# Patient Record
Sex: Female | Born: 1980 | Race: Black or African American | Hispanic: No | Marital: Single | Smoking: Current every day smoker
Health system: Southern US, Community
[De-identification: ages and names within clinical notes are randomized; demographics above are authoritative.]

## PROBLEM LIST (undated history)

## (undated) DIAGNOSIS — I10 Essential (primary) hypertension: Secondary | ICD-10-CM

## (undated) DIAGNOSIS — B009 Herpesviral infection, unspecified: Secondary | ICD-10-CM

## (undated) DIAGNOSIS — G43909 Migraine, unspecified, not intractable, without status migrainosus: Secondary | ICD-10-CM

## (undated) DIAGNOSIS — N926 Irregular menstruation, unspecified: Secondary | ICD-10-CM

## (undated) DIAGNOSIS — N83209 Unspecified ovarian cyst, unspecified side: Secondary | ICD-10-CM

## (undated) HISTORY — DX: Herpesviral infection, unspecified: B00.9

## (undated) HISTORY — PX: KNEE SURGERY: SHX244

---

## 2020-04-12 ENCOUNTER — Encounter (HOSPITAL_COMMUNITY): Payer: Self-pay | Admitting: *Deleted

## 2020-04-12 ENCOUNTER — Emergency Department (HOSPITAL_COMMUNITY)
Admission: EM | Admit: 2020-04-12 | Discharge: 2020-04-12 | Disposition: A | Discharge status: Home / Self Care | Payer: Self-pay | Attending: Emergency Medicine | Admitting: Emergency Medicine

## 2020-04-12 ENCOUNTER — Emergency Department (HOSPITAL_COMMUNITY): Payer: Self-pay

## 2020-04-12 ENCOUNTER — Other Ambulatory Visit: Payer: Self-pay

## 2020-04-12 DIAGNOSIS — F17200 Nicotine dependence, unspecified, uncomplicated: Secondary | ICD-10-CM | POA: Insufficient documentation

## 2020-04-12 DIAGNOSIS — D259 Leiomyoma of uterus, unspecified: Secondary | ICD-10-CM | POA: Insufficient documentation

## 2020-04-12 DIAGNOSIS — R112 Nausea with vomiting, unspecified: Secondary | ICD-10-CM | POA: Insufficient documentation

## 2020-04-12 DIAGNOSIS — K8020 Calculus of gallbladder without cholecystitis without obstruction: Secondary | ICD-10-CM | POA: Insufficient documentation

## 2020-04-12 LAB — LIPASE, BLOOD: Lipase: 30 U/L (ref 11–51)

## 2020-04-12 LAB — URINALYSIS, ROUTINE W REFLEX MICROSCOPIC
Bacteria, UA: NONE SEEN
Bilirubin Urine: NEGATIVE
Glucose, UA: NEGATIVE mg/dL
Hgb urine dipstick: NEGATIVE
Ketones, ur: NEGATIVE mg/dL
Leukocytes,Ua: NEGATIVE
Nitrite: NEGATIVE
Protein, ur: NEGATIVE mg/dL
Specific Gravity, Urine: 1.02 (ref 1.005–1.030)
pH: 6 (ref 5.0–8.0)

## 2020-04-12 LAB — COMPREHENSIVE METABOLIC PANEL
ALT: 13 U/L (ref 0–44)
AST: 15 U/L (ref 15–41)
Albumin: 4.6 g/dL (ref 3.5–5.0)
Alkaline Phosphatase: 59 U/L (ref 38–126)
Anion gap: 8 (ref 5–15)
BUN: 19 mg/dL (ref 6–20)
CO2: 27 mmol/L (ref 22–32)
Calcium: 9.3 mg/dL (ref 8.9–10.3)
Chloride: 102 mmol/L (ref 98–111)
Creatinine, Ser: 1.02 mg/dL — ABNORMAL HIGH (ref 0.44–1.00)
GFR calc Af Amer: >60 mL/min (ref >60)
GFR calc non Af Amer: >60 mL/min (ref >60)
Glucose, Bld: 109 mg/dL — ABNORMAL HIGH (ref 70–99)
Potassium: 4.1 mmol/L (ref 3.5–5.1)
Sodium: 137 mmol/L (ref 135–145)
Total Bilirubin: 0.4 mg/dL (ref 0.3–1.2)
Total Protein: 8.4 g/dL — ABNORMAL HIGH (ref 6.5–8.1)

## 2020-04-12 LAB — CBC
HCT: 43.5 % (ref 36.0–46.0)
Hemoglobin: 14.3 g/dL (ref 12.0–15.0)
MCH: 29.7 pg (ref 26.0–34.0)
MCHC: 32.9 g/dL (ref 30.0–36.0)
MCV: 90.2 fL (ref 80.0–100.0)
Platelets: 332 10*3/uL (ref 150–400)
RBC: 4.82 MIL/uL (ref 3.87–5.11)
RDW: 13.9 % (ref 11.5–15.5)
WBC: 10.3 10*3/uL (ref 4.0–10.5)
nRBC: 0 % (ref 0.0–0.2)

## 2020-04-12 LAB — I-STAT BETA HCG BLOOD, ED (MC, WL, AP ONLY): I-stat hCG, quantitative: <5 m[IU]/mL (ref <5)

## 2020-04-12 MED ORDER — ONDANSETRON 4 MG PO TBDP: 4.0000 mg | ORAL_TABLET | Freq: Three times a day (TID) | ORAL | 0 refills | Status: DC | PRN

## 2020-04-12 MED ORDER — MORPHINE SULFATE (PF) 4 MG/ML IV SOLN
4.0000 mg | Freq: Once | INTRAVENOUS | Status: AC
  Administered 2020-04-12: 4 mg via INTRAVENOUS
  Filled 2020-04-12: qty 1

## 2020-04-12 MED ORDER — SODIUM CHLORIDE 0.9 % IV BOLUS
1000.0000 mL | Freq: Once | INTRAVENOUS | Status: AC
  Administered 2020-04-12: 1000 mL via INTRAVENOUS

## 2020-04-12 MED ORDER — SODIUM CHLORIDE (PF) 0.9 % IJ SOLN
INTRAMUSCULAR | Status: AC
  Filled 2020-04-12: qty 50

## 2020-04-12 MED ORDER — ONDANSETRON HCL 4 MG/2ML IJ SOLN
4.0000 mg | Freq: Once | INTRAMUSCULAR | Status: AC
  Administered 2020-04-12: 4 mg via INTRAVENOUS
  Filled 2020-04-12: qty 2

## 2020-04-12 MED ORDER — IOHEXOL 300 MG/ML  SOLN
100.0000 mL | Freq: Once | INTRAMUSCULAR | Status: AC | PRN
  Administered 2020-04-12: 100 mL via INTRAVENOUS

## 2020-04-12 NOTE — ED Notes (Signed)
Pt ambulated to restroom without assistance.

## 2020-04-12 NOTE — ED Provider Notes (Signed)
[place] DEPT Provider Note   CSN: [id number] Arrival date & time: 04/12/20  1001     History Chief Complaint  Patient presents with  . Abdominal Pain  . Emesis    Gabriela Best is a 39 y.o. female who presents to ED with a chief complaint of abdominal pain.  Reports some right middle and upper abdominal pain for the past week.  Pain has since progressed to the epigastric and left-sided area with associated nonbloody, nonbilious emesis since yesterday.  No trigger that patient is aware of.  Denies any changes to bowel movements or urination.  Denies possibility of pregnancy.  Has not been taking medications to help with symptoms.  No prior abdominal surgeries.  Reports occasional alcohol use but denies daily use, drug use or chronic NSAID use.  No sick contacts with similar symptoms.  Denies any cough, chest pain, shortness of breath, congestion, back pain or fevers.  HPI     History reviewed. No pertinent past medical history.  There are no problems to display for this patient.   Past Surgical History:  Procedure Laterality Date  . KNEE SURGERY       OB History   No obstetric history on file.     No family history on file.  Social History   Tobacco Use  . Smoking status: Current Every Day Smoker  . Smokeless tobacco: Never Used  Substance Use Topics  . Alcohol use: Yes    Comment: occ  . Drug use: Never    Home Medications Prior to Admission medications   Medication Sig Start Date End Date Taking? Authorizing Provider  hydrocortisone cream 1 % Apply 1 application topically 4 (four) times daily as needed for itching (eczema).   Yes [provider]  ondansetron (ZOFRAN ODT) 4 MG disintegrating tablet Take 1 tablet (4 mg total) by mouth every 8 (eight) hours as needed for nausea or vomiting. 04/12/20   Delia Heady, PA-C    Allergies    Patient has no known allergies.  Review of Systems   Review of Systems    Constitutional: Negative for appetite change, chills and fever.  HENT: Negative for ear pain, rhinorrhea, sneezing and sore throat.   Eyes: Negative for photophobia and visual disturbance.  Respiratory: Negative for cough, chest tightness, shortness of breath and wheezing.   Cardiovascular: Negative for chest pain and palpitations.  Gastrointestinal: Positive for abdominal pain, nausea and vomiting. Negative for blood in stool, constipation and diarrhea.  Genitourinary: Negative for dysuria, hematuria and urgency.  Musculoskeletal: Negative for myalgias.  Skin: Negative for rash.  Neurological: Negative for dizziness, weakness and light-headedness.    Physical Exam Updated Vital Signs BP (!) 155/76   Pulse 68   Temp 99 F (37.2 C) (Oral)   Resp 15   Ht 5\' 3"  (1.6 m)   Wt 119.3 kg   SpO2 99%   BMI 46.59 kg/m   Physical Exam Vitals and nursing note reviewed.  Constitutional:      General: She is not in acute distress.    Appearance: She is well-developed.  HENT:     Head: Normocephalic and atraumatic.     Nose: Nose normal.  Eyes:     General: No scleral icterus.       Left eye: No discharge.     Conjunctiva/sclera: Conjunctivae normal.  Cardiovascular:     Rate and Rhythm: Normal rate and regular rhythm.     Heart sounds: Normal heart sounds. No murmur  heard.  No friction rub. No gallop.   Pulmonary:     Effort: Pulmonary effort is normal. No respiratory distress.     Breath sounds: Normal breath sounds.  Abdominal:     General: Bowel sounds are normal. There is no distension.     Palpations: Abdomen is soft.     Tenderness: There is abdominal tenderness in the epigastric area and left upper quadrant. There is no guarding.  Musculoskeletal:        General: Normal range of motion.     Cervical back: Normal range of motion and neck supple.  Skin:    General: Skin is warm and dry.     Findings: No rash.  Neurological:     Mental Status: She is alert.     Motor:  No abnormal muscle tone.     Coordination: Coordination normal.     ED Results / Procedures / Treatments   Labs (all labs ordered are listed, but only abnormal results are displayed) Labs Reviewed  COMPREHENSIVE METABOLIC PANEL - Abnormal; Notable for the following components:      Result Value   Glucose, Bld 109 (*)    Creatinine, Ser 1.02 (*)    Total Protein 8.4 (*)    All other components within normal limits  URINALYSIS, ROUTINE W REFLEX MICROSCOPIC - Abnormal; Notable for the following components:   Color, Urine STRAW (*)    All other components within normal limits  LIPASE, BLOOD  CBC  I-STAT BETA HCG BLOOD, ED ([facility], [facility], [facility] ONLY)    EKG None  Radiology CT ABDOMEN PELVIS W CONTRAST  Result Date: 04/12/2020 CLINICAL DATA:  Diverticulitis suspected. Abdominal pain for 1 week. EXAM: CT ABDOMEN AND PELVIS WITH CONTRAST TECHNIQUE: Multidetector CT imaging of the abdomen and pelvis was performed using the standard protocol following bolus administration of intravenous contrast. CONTRAST:  156mL OMNIPAQUE IOHEXOL 300 MG/ML  SOLN COMPARISON:  None. FINDINGS: Lower chest:  No contributory findings. Hepatobiliary: No focal liver abnormality.At least 1 gallstone. No evidence of cholecystitis. Pancreas: Unremarkable. Spleen: Unremarkable. Adrenals/Urinary Tract: Negative adrenals. No hydronephrosis or stone. Unremarkable bladder. Stomach/Bowel:  No obstruction. No appendicitis. Vascular/Lymphatic: No acute vascular abnormality. No mass or adenopathy. Reproductive:IUD which is posteriorly eccentric to the uterus, likely from an anterior fibroid. A subserosal fibroid is seen at the fundus and measures 3.3 cm. Other: No ascites or pneumoperitoneum. Musculoskeletal: No acute abnormalities. IMPRESSION: 1. No acute finding. 2. Fibroid uterus. 3. Cholelithiasis. Electronically Signed   By: Monte Fantasia M.D.   On: 04/12/2020 13:54    Procedures Procedures (including critical care  time)  Medications Ordered in ED Medications  sodium chloride 0.9 % bolus 1,000 mL (0 mLs Intravenous Stopped 04/12/20 1252)  morphine 4 MG/ML injection 4 mg (4 mg Intravenous Given 04/12/20 1119)  ondansetron (ZOFRAN) injection 4 mg (4 mg Intravenous Given 04/12/20 1117)  sodium chloride (PF) 0.9 % injection (  Given by Other 04/12/20 1340)  iohexol (OMNIPAQUE) 300 MG/ML solution 100 mL (100 mLs Intravenous Contrast Given 04/12/20 1306)    ED Course  I have reviewed the triage vital signs and the nursing notes.  Pertinent labs & imaging results that were available during my care of the patient were reviewed by me and considered in my medical decision making (see chart for details).    MDM Rules/Calculators/A&P                          39 year old female presenting  to the ED with a chief complaint of abdominal pain.  Right-sided abdominal pain that is now epigastric and left-sided.  Began 1 week ago and progressed yesterday.  Reports emesis since yesterday but no changes to bowel movements or urination.  Abdomen tender in epigastric area.  No pelvic complaints or lower abdominal tenderness.  Afebrile without recent use of antipyretics.  Lab work including CBC, CMP, lipase unremarkable.  hCG is negative.  Urinalysis is unremarkable.  Due to patient's tenderness and duration of pain will need to obtain CT scan to rule out acute emergent or surgical cause of symptoms.  Will attempt to control symptoms here with IV medications.  Patient with significant improvement in her symptoms with medications given.  CT of the abdomen pelvis without any acute findings.  There are some gallstones without evidence of cholecystitis.  There is a uterine fibroid present but IUD is still present in the uterus.  Suspect that her symptoms are viral. I will have her follow-up with surgery regarding her gallstones as this prior pain earlier in the week could be biliary colic.  Patient is agreeable to the plan.  We will give  symptomatic treatment at home and have her can continue hydration.  All imaging, if done today, including plain films, CT scans, and ultrasounds, independently reviewed by me, and interpretations confirmed via formal radiology reads.  Patient is hemodynamically stable, in NAD, and able to ambulate in the ED. Evaluation does not show pathology that would require ongoing emergent intervention or inpatient treatment. I explained the diagnosis to the patient. Pain has been managed and has no complaints prior to discharge. Patient is comfortable with above plan and is stable for discharge at this time. All questions were answered prior to disposition. Strict return precautions for returning to the ED were discussed. Encouraged follow up with PCP.   An After Visit Summary was printed and given to the patient.   Portions of this note were generated with [employer]. Dictation errors may occur despite best attempts at proofreading.  Final Clinical Impression(s) / ED Diagnoses Final diagnoses:  Calculus of gallbladder without cholecystitis without obstruction  Non-intractable vomiting with nausea, unspecified vomiting type  Uterine leiomyoma, unspecified location    Rx / DC Orders ED Discharge Orders         Ordered    ondansetron (ZOFRAN ODT) 4 MG disintegrating tablet  Every 8 hours PRN        04/12/20 1400           Delia Heady, PA-C 04/12/20 1406    Veryl Speak, MD 04/12/20 1424

## 2020-04-12 NOTE — ED Notes (Signed)
Pt educated on discharge instructions and follow up care. PT verbalized understanding and denies questions at this time. Pt family member present to drive pt home.

## 2020-04-12 NOTE — Discharge Instructions (Addendum)
Take the medications as needed to help with your symptoms. Make sure you are drinking plenty of water and slowly advancing your diet as tolerated. Your CT scan showed that you had a small gallstone.  You will need to follow-up with your primary care provider about this.  I have given follow-up with surgery as well. There is no evidence that your gallbladder is inflamed or infected. Return to the ER if you start to experience worsening pain, fever, increased vomiting, chest pain or shortness of breath.

## 2020-04-12 NOTE — ED Triage Notes (Signed)
Reports abd pain x 1 week with vomiting that has increased. This morning "can't take it anymore"

## 2020-09-23 ENCOUNTER — Emergency Department (HOSPITAL_COMMUNITY): Payer: Self-pay

## 2020-09-23 ENCOUNTER — Encounter (HOSPITAL_COMMUNITY): Payer: Self-pay

## 2020-09-23 ENCOUNTER — Emergency Department (HOSPITAL_COMMUNITY)
Admission: EM | Admit: 2020-09-23 | Discharge: 2020-09-23 | Disposition: A | Discharge status: Home / Self Care | Payer: Self-pay | Attending: Emergency Medicine | Admitting: Emergency Medicine

## 2020-09-23 ENCOUNTER — Other Ambulatory Visit: Payer: Self-pay

## 2020-09-23 DIAGNOSIS — R55 Syncope and collapse: Secondary | ICD-10-CM | POA: Insufficient documentation

## 2020-09-23 DIAGNOSIS — R519 Headache, unspecified: Secondary | ICD-10-CM | POA: Insufficient documentation

## 2020-09-23 DIAGNOSIS — F17210 Nicotine dependence, cigarettes, uncomplicated: Secondary | ICD-10-CM | POA: Insufficient documentation

## 2020-09-23 HISTORY — DX: Migraine, unspecified, not intractable, without status migrainosus: G43.909

## 2020-09-23 LAB — I-STAT BETA HCG BLOOD, ED (MC, WL, AP ONLY): I-stat hCG, quantitative: <5 m[IU]/mL (ref <5)

## 2020-09-23 LAB — BASIC METABOLIC PANEL
Anion gap: 7 (ref 5–15)
BUN: 11 mg/dL (ref 6–20)
CO2: 26 mmol/L (ref 22–32)
Calcium: 9.2 mg/dL (ref 8.9–10.3)
Chloride: 102 mmol/L (ref 98–111)
Creatinine, Ser: 0.79 mg/dL (ref 0.44–1.00)
GFR, Estimated: >60 mL/min (ref >60)
Glucose, Bld: 86 mg/dL (ref 70–99)
Potassium: 4.7 mmol/L (ref 3.5–5.1)
Sodium: 135 mmol/L (ref 135–145)

## 2020-09-23 LAB — CBC
HCT: 42.6 % (ref 36.0–46.0)
Hemoglobin: 13.8 g/dL (ref 12.0–15.0)
MCH: 29.2 pg (ref 26.0–34.0)
MCHC: 32.4 g/dL (ref 30.0–36.0)
MCV: 90.3 fL (ref 80.0–100.0)
Platelets: 326 10*3/uL (ref 150–400)
RBC: 4.72 MIL/uL (ref 3.87–5.11)
RDW: 14 % (ref 11.5–15.5)
WBC: 10 10*3/uL (ref 4.0–10.5)
nRBC: 0 % (ref 0.0–0.2)

## 2020-09-23 LAB — URINALYSIS, ROUTINE W REFLEX MICROSCOPIC
Bilirubin Urine: NEGATIVE
Glucose, UA: NEGATIVE mg/dL
Hgb urine dipstick: NEGATIVE
Ketones, ur: NEGATIVE mg/dL
Leukocytes,Ua: NEGATIVE
Nitrite: NEGATIVE
Protein, ur: NEGATIVE mg/dL
Specific Gravity, Urine: 1.016 (ref 1.005–1.030)
pH: 5 (ref 5.0–8.0)

## 2020-09-23 LAB — CBG MONITORING, ED: Glucose-Capillary: 81 mg/dL (ref 70–99)

## 2020-09-23 MED ORDER — PROCHLORPERAZINE EDISYLATE 10 MG/2ML IJ SOLN
10.0000 mg | Freq: Once | INTRAMUSCULAR | Status: AC
  Administered 2020-09-23: 10 mg via INTRAVENOUS
  Filled 2020-09-23: qty 2

## 2020-09-23 MED ORDER — SODIUM CHLORIDE 0.9 % IV BOLUS
1000.0000 mL | Freq: Once | INTRAVENOUS | Status: AC
  Administered 2020-09-23: 1000 mL via INTRAVENOUS

## 2020-09-23 MED ORDER — DIPHENHYDRAMINE HCL 50 MG/ML IJ SOLN
25.0000 mg | Freq: Once | INTRAMUSCULAR | Status: AC
  Administered 2020-09-23: 25 mg via INTRAVENOUS
  Filled 2020-09-23: qty 1

## 2020-09-23 MED ORDER — KETOROLAC TROMETHAMINE 30 MG/ML IJ SOLN
30.0000 mg | Freq: Once | INTRAMUSCULAR | Status: AC
  Administered 2020-09-23: 30 mg via INTRAVENOUS
  Filled 2020-09-23: qty 1
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
## 2020-09-23 NOTE — Discharge Instructions (Signed)
Please follow-up with [facility] which is a free clinic in the area to help manage your headaches.  Return to the ER for any new or worsening symptoms.
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
## 2020-10-27 ENCOUNTER — Encounter (HOSPITAL_COMMUNITY): Payer: Self-pay

## 2020-10-27 ENCOUNTER — Emergency Department (HOSPITAL_COMMUNITY)
Admission: EM | Admit: 2020-10-27 | Discharge: 2020-10-27 | Disposition: A | Discharge status: Home / Self Care | Payer: Worker's Compensation | Attending: Emergency Medicine | Admitting: Emergency Medicine

## 2020-10-27 ENCOUNTER — Emergency Department (HOSPITAL_COMMUNITY): Payer: Worker's Compensation

## 2020-10-27 ENCOUNTER — Other Ambulatory Visit: Payer: Self-pay

## 2020-10-27 DIAGNOSIS — Y92410 Unspecified street and highway as the place of occurrence of the external cause: Secondary | ICD-10-CM | POA: Diagnosis not present

## 2020-10-27 DIAGNOSIS — S39012A Strain of muscle, fascia and tendon of lower back, initial encounter: Secondary | ICD-10-CM | POA: Diagnosis not present

## 2020-10-27 DIAGNOSIS — F17210 Nicotine dependence, cigarettes, uncomplicated: Secondary | ICD-10-CM | POA: Insufficient documentation

## 2020-10-27 DIAGNOSIS — S3992XA Unspecified injury of lower back, initial encounter: Secondary | ICD-10-CM | POA: Diagnosis present

## 2020-10-27 MED ORDER — NAPROXEN 500 MG PO TABS: 500.0000 mg | ORAL_TABLET | Freq: Two times a day (BID) | ORAL | 0 refills | Status: DC

## 2020-10-27 MED ORDER — OXYCODONE-ACETAMINOPHEN 5-325 MG PO TABS
1.0000 | ORAL_TABLET | Freq: Once | ORAL | Status: AC
  Administered 2020-10-27: 1 via ORAL
  Filled 2020-10-27: qty 1

## 2020-10-27 MED ORDER — CYCLOBENZAPRINE HCL 10 MG PO TABS: 10.0000 mg | ORAL_TABLET | Freq: Two times a day (BID) | ORAL | 0 refills | Status: DC | PRN
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
## 2020-11-02 ENCOUNTER — Encounter (HOSPITAL_BASED_OUTPATIENT_CLINIC_OR_DEPARTMENT_OTHER): Payer: Self-pay | Admitting: Emergency Medicine

## 2020-11-02 ENCOUNTER — Emergency Department (HOSPITAL_BASED_OUTPATIENT_CLINIC_OR_DEPARTMENT_OTHER)
Admission: EM | Admit: 2020-11-02 | Discharge: 2020-11-02 | Disposition: A | Discharge status: Home / Self Care | Payer: Worker's Compensation | Attending: Emergency Medicine | Admitting: Emergency Medicine

## 2020-11-02 ENCOUNTER — Other Ambulatory Visit: Payer: Self-pay

## 2020-11-02 ENCOUNTER — Emergency Department (HOSPITAL_BASED_OUTPATIENT_CLINIC_OR_DEPARTMENT_OTHER): Payer: Worker's Compensation

## 2020-11-02 DIAGNOSIS — F17210 Nicotine dependence, cigarettes, uncomplicated: Secondary | ICD-10-CM | POA: Diagnosis not present

## 2020-11-02 DIAGNOSIS — R109 Unspecified abdominal pain: Secondary | ICD-10-CM

## 2020-11-02 DIAGNOSIS — S199XXA Unspecified injury of neck, initial encounter: Secondary | ICD-10-CM | POA: Diagnosis present

## 2020-11-02 DIAGNOSIS — Y92410 Unspecified street and highway as the place of occurrence of the external cause: Secondary | ICD-10-CM | POA: Diagnosis not present

## 2020-11-02 DIAGNOSIS — S161XXA Strain of muscle, fascia and tendon at neck level, initial encounter: Secondary | ICD-10-CM | POA: Insufficient documentation

## 2020-11-02 DIAGNOSIS — K8020 Calculus of gallbladder without cholecystitis without obstruction: Secondary | ICD-10-CM | POA: Insufficient documentation

## 2020-11-02 DIAGNOSIS — M545 Low back pain, unspecified: Secondary | ICD-10-CM

## 2020-11-02 LAB — URINALYSIS, ROUTINE W REFLEX MICROSCOPIC
Bilirubin Urine: NEGATIVE
Glucose, UA: NEGATIVE mg/dL
Ketones, ur: NEGATIVE mg/dL
Leukocytes,Ua: NEGATIVE
Nitrite: NEGATIVE
Protein, ur: NEGATIVE mg/dL
Specific Gravity, Urine: >1.03 — ABNORMAL HIGH (ref 1.005–1.030)
pH: 5.5 (ref 5.0–8.0)

## 2020-11-02 LAB — URINALYSIS, MICROSCOPIC (REFLEX)

## 2020-11-02 LAB — PREGNANCY, URINE: Preg Test, Ur: NEGATIVE

## 2020-11-02 MED ORDER — LIDOCAINE 5 % EX PTCH: 1.0000 | MEDICATED_PATCH | CUTANEOUS | 0 refills | Status: DC

## 2020-11-02 MED ORDER — OXYCODONE-ACETAMINOPHEN 5-325 MG PO TABS: 1.0000 | ORAL_TABLET | Freq: Four times a day (QID) | ORAL | 0 refills | Status: DC | PRN

## 2020-11-02 MED ORDER — KETOROLAC TROMETHAMINE 60 MG/2ML IM SOLN
60.0000 mg | Freq: Once | INTRAMUSCULAR | Status: AC
  Administered 2020-11-02: 60 mg via INTRAMUSCULAR
  Filled 2020-11-02: qty 2

## 2020-11-02 MED ORDER — OXYCODONE-ACETAMINOPHEN 5-325 MG PO TABS
1.0000 | ORAL_TABLET | Freq: Once | ORAL | Status: AC
Start: 2020-11-02 — End: 2020-11-02
  Administered 2020-11-02: 1 via ORAL
  Filled 2020-11-02: qty 1

## 2020-11-02 NOTE — ED Notes (Signed)
CT awaiting results of pregnancy test prior to imaging, per [facility] radiology protocols
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
## 2020-11-13 ENCOUNTER — Encounter (HOSPITAL_COMMUNITY): Payer: Self-pay

## 2020-11-13 ENCOUNTER — Observation Stay (HOSPITAL_COMMUNITY): Payer: Self-pay | Admitting: Anesthesiology

## 2020-11-13 ENCOUNTER — Inpatient Hospital Stay (HOSPITAL_COMMUNITY)
Admission: EM | Admit: 2020-11-13 | Discharge: 2020-11-14 | DRG: 418 | Disposition: A | Discharge status: Home / Self Care | Payer: Self-pay | Attending: Surgery | Admitting: Surgery

## 2020-11-13 ENCOUNTER — Emergency Department (HOSPITAL_COMMUNITY): Payer: Self-pay

## 2020-11-13 ENCOUNTER — Encounter (HOSPITAL_COMMUNITY): Admission: EM | Disposition: A | Discharge status: Home / Self Care | Payer: Self-pay | Source: Home / Self Care

## 2020-11-13 ENCOUNTER — Observation Stay (HOSPITAL_COMMUNITY): Payer: Self-pay

## 2020-11-13 ENCOUNTER — Other Ambulatory Visit: Payer: Self-pay

## 2020-11-13 DIAGNOSIS — K3532 Acute appendicitis with perforation and localized peritonitis, without abscess: Secondary | ICD-10-CM | POA: Diagnosis present

## 2020-11-13 DIAGNOSIS — K8020 Calculus of gallbladder without cholecystitis without obstruction: Secondary | ICD-10-CM | POA: Diagnosis present

## 2020-11-13 DIAGNOSIS — Z419 Encounter for procedure for purposes other than remedying health state, unspecified: Secondary | ICD-10-CM

## 2020-11-13 DIAGNOSIS — K8012 Calculus of gallbladder with acute and chronic cholecystitis without obstruction: Principal | ICD-10-CM | POA: Diagnosis present

## 2020-11-13 DIAGNOSIS — E875 Hyperkalemia: Secondary | ICD-10-CM | POA: Diagnosis present

## 2020-11-13 DIAGNOSIS — Z6841 Body Mass Index (BMI) 40.0 and over, adult: Secondary | ICD-10-CM

## 2020-11-13 DIAGNOSIS — K810 Acute cholecystitis: Secondary | ICD-10-CM

## 2020-11-13 DIAGNOSIS — Z20822 Contact with and (suspected) exposure to covid-19: Secondary | ICD-10-CM | POA: Diagnosis present

## 2020-11-13 DIAGNOSIS — F17210 Nicotine dependence, cigarettes, uncomplicated: Secondary | ICD-10-CM | POA: Diagnosis present

## 2020-11-13 HISTORY — PX: CHOLECYSTECTOMY: SHX55

## 2020-11-13 HISTORY — DX: Calculus of gallbladder without cholecystitis without obstruction: K80.20

## 2020-11-13 LAB — COMPREHENSIVE METABOLIC PANEL
ALT: 15 U/L (ref 0–44)
AST: 23 U/L (ref 15–41)
Albumin: 3.8 g/dL (ref 3.5–5.0)
Alkaline Phosphatase: 53 U/L (ref 38–126)
Anion gap: 7 (ref 5–15)
BUN: 12 mg/dL (ref 6–20)
CO2: 26 mmol/L (ref 22–32)
Calcium: 9.1 mg/dL (ref 8.9–10.3)
Chloride: 104 mmol/L (ref 98–111)
Creatinine, Ser: 0.86 mg/dL (ref 0.44–1.00)
GFR, Estimated: >60 mL/min (ref >60)
Glucose, Bld: 106 mg/dL — ABNORMAL HIGH (ref 70–99)
Potassium: 5.9 mmol/L — ABNORMAL HIGH (ref 3.5–5.1)
Sodium: 137 mmol/L (ref 135–145)
Total Bilirubin: 0.7 mg/dL (ref 0.3–1.2)
Total Protein: 6.8 g/dL (ref 6.5–8.1)

## 2020-11-13 LAB — CBC WITH DIFFERENTIAL/PLATELET
Abs Immature Granulocytes: 0.03 10*3/uL (ref 0.00–0.07)
Basophils Absolute: 0 10*3/uL (ref 0.0–0.1)
Basophils Relative: 0 %
Eosinophils Absolute: 0.3 10*3/uL (ref 0.0–0.5)
Eosinophils Relative: 3 %
HCT: 43.2 % (ref 36.0–46.0)
Hemoglobin: 13.8 g/dL (ref 12.0–15.0)
Immature Granulocytes: 0 %
Lymphocytes Relative: 17 %
Lymphs Abs: 1.7 10*3/uL (ref 0.7–4.0)
MCH: 29.6 pg (ref 26.0–34.0)
MCHC: 31.9 g/dL (ref 30.0–36.0)
MCV: 92.5 fL (ref 80.0–100.0)
Monocytes Absolute: 0.5 10*3/uL (ref 0.1–1.0)
Monocytes Relative: 5 %
Neutro Abs: 7.5 10*3/uL (ref 1.7–7.7)
Neutrophils Relative %: 75 %
Platelets: 293 10*3/uL (ref 150–400)
RBC: 4.67 MIL/uL (ref 3.87–5.11)
RDW: 14 % (ref 11.5–15.5)
WBC: 10 10*3/uL (ref 4.0–10.5)
nRBC: 0 % (ref 0.0–0.2)

## 2020-11-13 LAB — SURGICAL PCR SCREEN
MRSA, PCR: NEGATIVE
Staphylococcus aureus: POSITIVE — AB

## 2020-11-13 LAB — LIPASE, BLOOD: Lipase: 30 U/L (ref 11–51)

## 2020-11-13 LAB — RESP PANEL BY RT-PCR (FLU A&B, COVID) ARPGX2
Influenza A by PCR: NEGATIVE
Influenza B by PCR: NEGATIVE
SARS Coronavirus 2 by RT PCR: NEGATIVE

## 2020-11-13 LAB — I-STAT BETA HCG BLOOD, ED (MC, WL, AP ONLY): I-stat hCG, quantitative: <5 m[IU]/mL (ref <5)

## 2020-11-13 LAB — POTASSIUM: Potassium: 4.1 mmol/L (ref 3.5–5.1)

## 2020-11-13 SURGERY — LAPAROSCOPIC CHOLECYSTECTOMY WITH INTRAOPERATIVE CHOLANGIOGRAM
Anesthesia: General

## 2020-11-13 MED ORDER — LIDOCAINE 2% (20 MG/ML) 5 ML SYRINGE
INTRAMUSCULAR | Status: AC
  Filled 2020-11-13: qty 5

## 2020-11-13 MED ORDER — OXYCODONE HCL 5 MG PO TABS
ORAL_TABLET | ORAL | Status: AC
  Filled 2020-11-13: qty 1

## 2020-11-13 MED ORDER — OXYCODONE HCL 5 MG/5ML PO SOLN: 5.0000 mg | Freq: Once | ORAL | Status: AC | PRN

## 2020-11-13 MED ORDER — DEXAMETHASONE SODIUM PHOSPHATE 10 MG/ML IJ SOLN
INTRAMUSCULAR | Status: DC | PRN
  Administered 2020-11-13: 8 mg via INTRAVENOUS

## 2020-11-13 MED ORDER — DOCUSATE SODIUM 100 MG PO CAPS
100.0000 mg | ORAL_CAPSULE | Freq: Two times a day (BID) | ORAL | Status: DC
  Administered 2020-11-13 – 2020-11-14 (×2): 100 mg via ORAL
  Filled 2020-11-13 (×2): qty 1

## 2020-11-13 MED ORDER — CHLORHEXIDINE GLUCONATE 0.12 % MT SOLN
15.0000 mL | Freq: Once | OROMUCOSAL | Status: AC
  Administered 2020-11-13: 15 mL via OROMUCOSAL

## 2020-11-13 MED ORDER — SODIUM CHLORIDE 0.9 % IV BOLUS
500.0000 mL | Freq: Once | INTRAVENOUS | Status: AC
  Administered 2020-11-13: 500 mL via INTRAVENOUS

## 2020-11-13 MED ORDER — SODIUM CHLORIDE 0.9 % IV SOLN
12.5000 mg | Freq: Once | INTRAVENOUS | Status: DC
  Filled 2020-11-13: qty 0.5

## 2020-11-13 MED ORDER — HYDRALAZINE HCL 20 MG/ML IJ SOLN
10.0000 mg | INTRAMUSCULAR | Status: DC | PRN
  Administered 2020-11-13: 10 mg via INTRAVENOUS
  Filled 2020-11-13: qty 1

## 2020-11-13 MED ORDER — FENTANYL CITRATE (PF) 100 MCG/2ML IJ SOLN
INTRAMUSCULAR | Status: AC
  Filled 2020-11-13: qty 2

## 2020-11-13 MED ORDER — MIDAZOLAM HCL 2 MG/2ML IJ SOLN
INTRAMUSCULAR | Status: AC
  Filled 2020-11-13: qty 2

## 2020-11-13 MED ORDER — MORPHINE SULFATE (PF) 2 MG/ML IV SOLN
2.0000 mg | INTRAVENOUS | Status: DC | PRN
  Administered 2020-11-13: 2 mg via INTRAVENOUS
  Filled 2020-11-13 (×2): qty 1

## 2020-11-13 MED ORDER — MIDAZOLAM HCL 5 MG/5ML IJ SOLN
INTRAMUSCULAR | Status: DC | PRN
  Administered 2020-11-13: 2 mg via INTRAVENOUS

## 2020-11-13 MED ORDER — MUPIROCIN 2 % EX OINT
1.0000 "application " | TOPICAL_OINTMENT | Freq: Two times a day (BID) | CUTANEOUS | Status: DC
  Administered 2020-11-13 – 2020-11-14 (×2): 1 via NASAL
  Filled 2020-11-13 (×3): qty 22

## 2020-11-13 MED ORDER — PROPOFOL 10 MG/ML IV BOLUS
INTRAVENOUS | Status: AC
  Filled 2020-11-13: qty 20

## 2020-11-13 MED ORDER — ACETAMINOPHEN 650 MG RE SUPP: 650.0000 mg | Freq: Four times a day (QID) | RECTAL | Status: DC | PRN

## 2020-11-13 MED ORDER — DIPHENHYDRAMINE HCL 25 MG PO CAPS: 25.0000 mg | ORAL_CAPSULE | Freq: Four times a day (QID) | ORAL | Status: DC | PRN

## 2020-11-13 MED ORDER — BUPIVACAINE-EPINEPHRINE 0.25% -1:200000 IJ SOLN
INTRAMUSCULAR | Status: DC | PRN
  Administered 2020-11-13: 30 mL

## 2020-11-13 MED ORDER — PROMETHAZINE HCL 25 MG/ML IJ SOLN: 6.2500 mg | INTRAMUSCULAR | Status: DC | PRN

## 2020-11-13 MED ORDER — LACTATED RINGERS IV SOLN
INTRAVENOUS | Status: AC | PRN
  Administered 2020-11-13: 1000 mL

## 2020-11-13 MED ORDER — BUPIVACAINE-EPINEPHRINE (PF) 0.25% -1:200000 IJ SOLN
INTRAMUSCULAR | Status: AC
  Filled 2020-11-13: qty 30

## 2020-11-13 MED ORDER — ONDANSETRON HCL 4 MG/2ML IJ SOLN
INTRAMUSCULAR | Status: DC | PRN
  Administered 2020-11-13: 4 mg via INTRAVENOUS

## 2020-11-13 MED ORDER — ROCURONIUM BROMIDE 10 MG/ML (PF) SYRINGE
PREFILLED_SYRINGE | INTRAVENOUS | Status: DC | PRN
  Administered 2020-11-13: 60 mg via INTRAVENOUS

## 2020-11-13 MED ORDER — ENOXAPARIN SODIUM 40 MG/0.4ML IJ SOSY
40.0000 mg | PREFILLED_SYRINGE | INTRAMUSCULAR | Status: DC
  Administered 2020-11-13 – 2020-11-14 (×2): 40 mg via SUBCUTANEOUS
  Filled 2020-11-13 (×3): qty 0.4

## 2020-11-13 MED ORDER — PROPOFOL 10 MG/ML IV BOLUS
INTRAVENOUS | Status: DC | PRN
  Administered 2020-11-13: 200 mg via INTRAVENOUS
  Administered 2020-11-13: 50 mg via INTRAVENOUS

## 2020-11-13 MED ORDER — DEXAMETHASONE SODIUM PHOSPHATE 10 MG/ML IJ SOLN
INTRAMUSCULAR | Status: AC
  Filled 2020-11-13: qty 1

## 2020-11-13 MED ORDER — GABAPENTIN 300 MG PO CAPS
300.0000 mg | ORAL_CAPSULE | ORAL | Status: AC
  Administered 2020-11-13: 300 mg via ORAL
  Filled 2020-11-13: qty 1

## 2020-11-13 MED ORDER — SUGAMMADEX SODIUM 200 MG/2ML IV SOLN
INTRAVENOUS | Status: DC | PRN
  Administered 2020-11-13: 251.2 mg via INTRAVENOUS

## 2020-11-13 MED ORDER — FENTANYL CITRATE (PF) 100 MCG/2ML IJ SOLN
100.0000 ug | Freq: Once | INTRAMUSCULAR | Status: AC
  Administered 2020-11-13: 100 ug via INTRAMUSCULAR
  Filled 2020-11-13: qty 2

## 2020-11-13 MED ORDER — ROCURONIUM BROMIDE 10 MG/ML (PF) SYRINGE
PREFILLED_SYRINGE | INTRAVENOUS | Status: AC
  Filled 2020-11-13: qty 10

## 2020-11-13 MED ORDER — SODIUM CHLORIDE 0.9 % IV SOLN: 2.0000 g | INTRAVENOUS | Status: DC

## 2020-11-13 MED ORDER — ONDANSETRON HCL 4 MG/2ML IJ SOLN
INTRAMUSCULAR | Status: AC
  Filled 2020-11-13: qty 2

## 2020-11-13 MED ORDER — SUGAMMADEX SODIUM 500 MG/5ML IV SOLN
INTRAVENOUS | Status: AC
  Filled 2020-11-13: qty 5

## 2020-11-13 MED ORDER — SODIUM CHLORIDE 0.9 % IV SOLN: INTRAVENOUS | Status: DC

## 2020-11-13 MED ORDER — SODIUM CHLORIDE 0.9 % IV SOLN
2.0000 g | INTRAVENOUS | Status: DC
  Administered 2020-11-13: 2 g via INTRAVENOUS
  Filled 2020-11-13: qty 2
  Filled 2020-11-13: qty 20

## 2020-11-13 MED ORDER — FENTANYL CITRATE (PF) 250 MCG/5ML IJ SOLN
INTRAMUSCULAR | Status: AC
  Filled 2020-11-13: qty 5

## 2020-11-13 MED ORDER — ONDANSETRON 4 MG PO TBDP
4.0000 mg | ORAL_TABLET | Freq: Once | ORAL | Status: AC
  Administered 2020-11-13: 4 mg via ORAL
  Filled 2020-11-13: qty 1

## 2020-11-13 MED ORDER — ACETAMINOPHEN 500 MG PO TABS
1000.0000 mg | ORAL_TABLET | ORAL | Status: AC
  Administered 2020-11-13: 1000 mg via ORAL
  Filled 2020-11-13: qty 2

## 2020-11-13 MED ORDER — DIPHENHYDRAMINE HCL 50 MG/ML IJ SOLN: 25.0000 mg | Freq: Four times a day (QID) | INTRAMUSCULAR | Status: DC | PRN

## 2020-11-13 MED ORDER — POLYETHYLENE GLYCOL 3350 17 G PO PACK: 17.0000 g | PACK | Freq: Every day | ORAL | Status: DC | PRN

## 2020-11-13 MED ORDER — ONDANSETRON HCL 4 MG/2ML IJ SOLN
4.0000 mg | Freq: Four times a day (QID) | INTRAMUSCULAR | Status: DC | PRN
  Administered 2020-11-13: 4 mg via INTRAVENOUS
  Filled 2020-11-13: qty 2

## 2020-11-13 MED ORDER — LIDOCAINE 2% (20 MG/ML) 5 ML SYRINGE
INTRAMUSCULAR | Status: DC | PRN
  Administered 2020-11-13: 80 mg via INTRAVENOUS

## 2020-11-13 MED ORDER — IOHEXOL 300 MG/ML  SOLN
INTRAMUSCULAR | Status: DC | PRN
  Administered 2020-11-13: 12 mL

## 2020-11-13 MED ORDER — OXYCODONE HCL 5 MG PO TABS
5.0000 mg | ORAL_TABLET | Freq: Once | ORAL | Status: AC | PRN
  Administered 2020-11-13: 5 mg via ORAL

## 2020-11-13 MED ORDER — OXYCODONE HCL 5 MG PO TABS
5.0000 mg | ORAL_TABLET | ORAL | Status: DC | PRN
  Administered 2020-11-13: 5 mg via ORAL
  Administered 2020-11-14 (×3): 10 mg via ORAL
  Filled 2020-11-13 (×3): qty 2
  Filled 2020-11-13: qty 1

## 2020-11-13 MED ORDER — FENTANYL CITRATE (PF) 100 MCG/2ML IJ SOLN
INTRAMUSCULAR | Status: DC | PRN
  Administered 2020-11-13 (×5): 50 ug via INTRAVENOUS

## 2020-11-13 MED ORDER — MELATONIN 3 MG PO TABS: 3.0000 mg | ORAL_TABLET | Freq: Every evening | ORAL | Status: DC | PRN

## 2020-11-13 MED ORDER — ACETAMINOPHEN 325 MG PO TABS
650.0000 mg | ORAL_TABLET | Freq: Four times a day (QID) | ORAL | Status: DC | PRN
  Administered 2020-11-14: 650 mg via ORAL
  Filled 2020-11-13: qty 2

## 2020-11-13 MED ORDER — METHOCARBAMOL 500 MG PO TABS
500.0000 mg | ORAL_TABLET | Freq: Four times a day (QID) | ORAL | Status: DC | PRN
  Administered 2020-11-14: 500 mg via ORAL
  Filled 2020-11-13: qty 1

## 2020-11-13 MED ORDER — LACTATED RINGERS IV SOLN: INTRAVENOUS | Status: DC

## 2020-11-13 MED ORDER — FENTANYL CITRATE (PF) 100 MCG/2ML IJ SOLN
25.0000 ug | INTRAMUSCULAR | Status: DC | PRN
  Administered 2020-11-13 (×2): 50 ug via INTRAVENOUS

## 2020-11-13 MED ORDER — CHLORHEXIDINE GLUCONATE CLOTH 2 % EX PADS: 6.0000 | MEDICATED_PAD | Freq: Every day | CUTANEOUS | Status: DC

## 2020-11-13 SURGICAL SUPPLY — 43 items
APPLIER CLIP ROT 10 11.4 M/L (STAPLE) ×2
CABLE HIGH FREQUENCY MONO STRZ (ELECTRODE) ×2 IMPLANT
CATH URET 5FR 28IN OPEN ENDED (CATHETERS) ×2 IMPLANT
CHLORAPREP W/TINT 26 (MISCELLANEOUS) ×2 IMPLANT
CLIP APPLIE ROT 10 11.4 M/L (STAPLE) ×1 IMPLANT
CNTNR URN SCR LID CUP LEK RST (MISCELLANEOUS) ×1 IMPLANT
CONT SPEC 4OZ STRL OR WHT (MISCELLANEOUS) ×1
COVER MAYO STAND STRL (DRAPES) IMPLANT
COVER SURGICAL LIGHT HANDLE (MISCELLANEOUS) ×2 IMPLANT
COVER WAND RF STERILE (DRAPES) IMPLANT
DECANTER SPIKE VIAL GLASS SM (MISCELLANEOUS) ×2 IMPLANT
DERMABOND ADVANCED (GAUZE/BANDAGES/DRESSINGS) ×1
DERMABOND ADVANCED .7 DNX12 (GAUZE/BANDAGES/DRESSINGS) ×1 IMPLANT
DRAPE C-ARM 42X120 X-RAY (DRAPES) ×2 IMPLANT
ELECT REM PT RETURN 15FT ADLT (MISCELLANEOUS) ×2 IMPLANT
ENDOLOOP SUT PDS II  0 18 (SUTURE) ×1
ENDOLOOP SUT PDS II 0 18 (SUTURE) ×1 IMPLANT
GLOVE SRG 8 PF TXTR STRL LF DI (GLOVE) ×1 IMPLANT
GLOVE SURG ENC MOIS LTX SZ7.5 (GLOVE) ×2 IMPLANT
GLOVE SURG UNDER POLY LF SZ8 (GLOVE) ×1
GOWN STRL REUS W/TWL LRG LVL3 (GOWN DISPOSABLE) ×2 IMPLANT
GOWN STRL REUS W/TWL XL LVL3 (GOWN DISPOSABLE) ×4 IMPLANT
GRASPER SUT TROCAR 14GX15 (MISCELLANEOUS) IMPLANT
HEMOSTAT SNOW SURGICEL 2X4 (HEMOSTASIS) IMPLANT
IRRIG SUCT STRYKERFLOW 2 WTIP (MISCELLANEOUS) ×2
IRRIGATION SUCT STRKRFLW 2 WTP (MISCELLANEOUS) ×1 IMPLANT
IV CATH 14GX2 1/4 (CATHETERS) ×2 IMPLANT
KIT BASIN OR (CUSTOM PROCEDURE TRAY) ×2 IMPLANT
KIT TURNOVER KIT A (KITS) ×2 IMPLANT
NEEDLE INSUFFLATION 14GA 120MM (NEEDLE) ×2 IMPLANT
PENCIL SMOKE EVACUATOR (MISCELLANEOUS) IMPLANT
POUCH RETRIEVAL ECOSAC 10 (ENDOMECHANICALS) ×1 IMPLANT
POUCH RETRIEVAL ECOSAC 10MM (ENDOMECHANICALS) ×1
SCISSORS LAP 5X35 DISP (ENDOMECHANICALS) ×2 IMPLANT
SET TUBE SMOKE EVAC HIGH FLOW (TUBING) ×2 IMPLANT
SLEEVE XCEL OPT CAN 5 100 (ENDOMECHANICALS) ×4 IMPLANT
STOPCOCK 4 WAY LG BORE MALE ST (IV SETS) ×2 IMPLANT
SUT MNCRL AB 4-0 PS2 18 (SUTURE) ×2 IMPLANT
TOWEL OR 17X26 10 PK STRL BLUE (TOWEL DISPOSABLE) ×2 IMPLANT
TOWEL OR NON WOVEN STRL DISP B (DISPOSABLE) IMPLANT
TRAY LAPAROSCOPIC (CUSTOM PROCEDURE TRAY) ×2 IMPLANT
TROCAR BLADELESS OPT 5 100 (ENDOMECHANICALS) ×2 IMPLANT
TROCAR XCEL 12X100 BLDLESS (ENDOMECHANICALS) ×2 IMPLANT

## 2020-11-13 NOTE — Transfer of Care (Signed)
Immediate Anesthesia Transfer of Care Note  Patient: Gabriela Best  Procedure(s) Performed: LAPAROSCOPIC CHOLECYSTECTOMY WITH INTRAOPERATIVE CHOLANGIOGRAM (N/A )  Patient Location: PACU  Anesthesia Type:General  Level of Consciousness: drowsy and patient cooperative  Airway & Oxygen Therapy: Patient Spontanous Breathing and Patient connected to face mask oxygen  Post-op Assessment: Report given to RN and Post -op Vital signs reviewed and stable  Post vital signs: Reviewed and stable  Last Vitals:  Vitals Value Taken Time  BP 183/104 11/13/20 1819  Temp    Pulse 77 11/13/20 1823  Resp 13 11/13/20 1823  SpO2 99 % 11/13/20 1823  Vitals shown include unvalidated device data.  Last Pain:  Vitals:   11/13/20 1535  TempSrc:   PainSc: 0-No pain      Patients Stated Pain Goal: 0 (26/83/41 9622)  Complications: No complications documented.
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
## 2020-11-13 NOTE — ED Notes (Signed)
Surgery at bedside to evaluate

## 2020-11-13 NOTE — ED Notes (Signed)
Phlebotomy paged for repeat potassium draw in ED bed 14. Tammy in lab, notified and aware.

## 2020-11-13 NOTE — ED Notes (Signed)
Report given to floor nurse, Silver Huguenin, RN at this time.

## 2020-11-13 NOTE — ED Notes (Signed)
Report received from nightshift RN, Chrys Racer. Per nightshift RN, surgery to consult pt in the ED this morning for dispo.
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
## 2020-11-13 NOTE — Anesthesia Preprocedure Evaluation (Signed)
Anesthesia Evaluation  Patient identified by MRN, date of birth, ID band Patient awake    Reviewed: Allergy & Precautions, NPO status , Patient's Chart, lab work & pertinent test results  Airway Mallampati: III  TM Distance: >3 FB Neck ROM: Full    Dental  (+) Teeth Intact   Pulmonary neg pulmonary ROS, Current Smoker and Patient abstained from smoking.,    Pulmonary exam normal        Cardiovascular negative cardio ROS   Rhythm:Regular Rate:Normal     Neuro/Psych  Headaches, negative psych ROS   GI/Hepatic Neg liver ROS, Cholecystitis    Endo/Other  Morbid obesity  Renal/GU negative Renal ROS  negative genitourinary   Musculoskeletal negative musculoskeletal ROS (+)   Abdominal (+) + obese,   Peds  Hematology negative hematology ROS (+)   Anesthesia Other Findings   Reproductive/Obstetrics negative OB ROS                             Anesthesia Physical Anesthesia Plan  ASA: III  Anesthesia Plan: General   Post-op Pain Management:    Induction: Intravenous  PONV Risk Score and Plan: 2 and Ondansetron, Dexamethasone, Midazolam and Treatment may vary due to age or medical condition  Airway Management Planned: Mask and Oral ETT  Additional Equipment: None  Intra-op Plan:   Post-operative Plan: Extubation in OR  Informed Consent: I have reviewed the patients History and Physical, chart, labs and discussed the procedure including the risks, benefits and alternatives for the proposed anesthesia with the patient or authorized representative who has indicated his/her understanding and acceptance.       Plan Discussed with: CRNA  Anesthesia Plan Comments: (Lab Results      Component                Value               Date                      WBC                      10.0                11/13/2020                HGB                      13.8                11/13/2020                 HCT                      43.2                11/13/2020                MCV                      92.5                11/13/2020                PLT                      293  11/13/2020           Lab Results      Component                Value               Date                      NA                       137                 11/13/2020                K                        4.1                 11/13/2020                CO2                      26                  11/13/2020                GLUCOSE                  106 (H)             11/13/2020                BUN                      12                  11/13/2020                CREATININE               0.86                11/13/2020                CALCIUM                  9.1                 11/13/2020                GFRNONAA                 >60                 11/13/2020                GFRAA                    >60                 04/12/2020          )        Anesthesia Quick Evaluation

## 2020-11-13 NOTE — Op Note (Signed)
Patient: Gabriela Best (August 25, 1980, [id number])  Date of Surgery: 11/13/2020   Preoperative Diagnosis: SYMPTOMATIC CHLECYSTITIS   Postoperative Diagnosis: SYMPTOMATIC CHLECYSTITIS   Surgical Procedure: LAPAROSCOPIC CHOLECYSTECTOMY WITH INTRAOPERATIVE CHOLANGIOGRAM:    Operative Team Members:  Surgeon(s) and Role:    * Rosaire Cueto, Nickola Major, MD - Primary    * Greer Pickerel, MD - Assisting   Anesthesiologist: Darral Dash, DO CRNA: Montel Clock, CRNA   Anesthesia: General   Fluids:  Total I/O In: 588.3 [I.V.:488.3; IV [id number]:195] Out: 0   Complications: None  Drains:  none   Specimen:  ID Type Source Tests Collected by Time Destination  1 : gallbladder GI Gallbladder SURGICAL PATHOLOGY Malashia Kamaka, Nickola Major, MD 11/13/2020 1721      Disposition:  PACU - hemodynamically stable.  Plan of Care: Admit for overnight observation    Indications for Procedure: Laurie Penado is a 40 y.o. female who presented with abdominal pain.  History, physical and imaging was concerning for symptomatic cholelithiasis.  Laparoscopic cholecystectomy was recommended for the patient.  The procedure itself, as well as the risks, benefits and alternatives were discussed with the patient.  Risks discussed included but were not limited to the risk of infection, bleeding, damage to nearby structures, need to convert to open procedure, incisional hernia, bile leak, common bile duct injury and the need for additional procedures or surgeries.  With this discussion complete and all questions answered the patient granted consent to proceed.  Findings:  Inflamed gallbladder Infection status: Patient: Gabriela Best Emergency General Surgery Service Patient Case: Urgent Infection Present At Time Of Surgery (PATOS): spillage of bile from the cystic ductotomy site   Description of Procedure:   On the date stated above, the patient was taken to the operating room suite and placed in supine  positioning.  Sequential compression devices were placed on the lower extremities to prevent blood clots.  General endotracheal anesthesia was induced. Preoperative antibiotics were given.  The patient's abdomen was prepped and draped in the usual sterile fashion.  A time-out was completed verifying the correct patient, procedure, positioning and equipment needed for the case.  We began by anesthetizing the skin with local anesthetic and then making a 5 mm incision just above the umbilicus.  We dissected through the subcutaneous tissues to the fascia.  The fascia was grasped and elevated using a Kocher clamp.  A Veress needle was inserted into the abdomen and the abdomen was insufflated to 15 mmHg.  A 5 mm trocar was inserted in this position under optical guidance and then the abdomen was inspected.  There was no trauma to the underlying viscera with initial trocar placement.  Any abnormal findings, other than inflammation in the right upper quadrant, are listed above in the findings section.  Three additional trocars were placed, one 12 mm trocar in the subxiphoid position, one 5 mm trocar in the midline epigastric area and one 96mm trocar in the right upper quadrant subcostally.  These were placed under direct vision without any trauma to the underlying viscera.    The patient was then placed in head up, left side down positioning.  The gallbladder was identified and dissected free from its attachments to the omentum allowing the duodenum to fall away.  The infundibulum of the gallbladder was dissected free working laterally to medially.  The cystic duct and cystic artery were dissected free from surrounding connective tissue.  The infundibulum of the gallbladder was dissected off the cystic plate.  A critical view of  safety was obtained with the cystic duct and cystic artery being cleared of connective tissues and clearly the only two structures entering into the gallbladder with the liver clearly visible  behind.  One clip was applied high on the cystic duct.  A small ductotomy was created below this using the endoscopic shears.  A cholangiogram catheter was introduced through the abdominal wall and into the cystic duct through this ductotomy.  The catheter was clipped into position.  The catheter was flushed to ensure no leakage around the clip.  We then removed the laparoscopic instruments and positioned the C-Arm to perform a cholangiogram.  The catheter was flushed with contrast under fluoroscopic visualization and a cholangiogram was obtained.  The cholangiogram visualized the biliary tree from the ampulla up to the first two biliary radicals in the liver.  There were no filling defects identified.  The catheter clearly entered the cystic duct.  There was gradual tapering of the common bile duct down to the ampulla without evidence of stricture or other abnormalities.  Please see the EMR for saved representative images.  With our cholangiogram compete, we moved the c-arm away from the field and returned to laparoscopic surgery.    Clips were then applied to the cystic duct and cystic artery and then these structures were divided.  The gallbladder was dissected off the cystic plate, placed in an endocatch bag and removed from the 12 mm subxiphoid port site.  The clips were inspected and appeared effective.  The cystic plate was inspected and hemostasis was obtained using electrocautery.  A suction irrigator was used to clean the operative field.  Attention was turned to closure.  The 12 mm subxiphoid port site was closed using a 0-vicryl suture on a fascial suture passer.  The abdomen was desufflated.  The skin was closed using 4-0 monocryl and dermabond.  All sponge and needle counts were correct at the conclusion of the case.    Louanna Raw, MD General, Bariatric, & Minimally Invasive Surgery [facility], [place]

## 2020-11-13 NOTE — ED Notes (Signed)
Pt departed the dept via stretcher and ED tech accompanying 
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
## 2020-11-13 NOTE — ED Notes (Signed)
Phlebotomy at the bedside  

## 2020-11-13 NOTE — ED Notes (Signed)
Repeat potassium collected by phlebotomy and sent to lab. Verified with lab potassium has been received.

## 2020-11-13 NOTE — Discharge Instructions (Signed)
[facility] ° °Please arrive at least 30 min before your appointment to complete your check in paperwork.  If you are unable to arrive 30 min prior to your appointment time we may have to cancel or reschedule you. °LAPAROSCOPIC SURGERY: POST OP INSTRUCTIONS °Always review your discharge instruction sheet given to you by the facility where your surgery was performed. °IF YOU HAVE DISABILITY OR FAMILY LEAVE FORMS, YOU MUST BRING THEM TO THE OFFICE FOR PROCESSING.   °DO NOT GIVE THEM TO YOUR DOCTOR. ° °PAIN CONTROL ° °1. First take acetaminophen (Tylenol) AND/or ibuprofen (Advil) to control your pain after surgery.  Follow directions on package.  Taking acetaminophen (Tylenol) and/or ibuprofen (Advil) regularly after surgery will help to control your pain and lower the amount of prescription pain medication you may need.  You should not take more than 4,000 mg (4 grams) of acetaminophen (Tylenol) in 24 hours.  You should not take ibuprofen (Advil), aleve, motrin, naprosyn or other NSAIDS if you have a history of stomach ulcers or chronic kidney disease.  °2. A prescription for pain medication may be given to you upon discharge.  Take your pain medication as prescribed, if you still have uncontrolled pain after taking acetaminophen (Tylenol) or ibuprofen (Advil). °3. Use ice packs to help control pain. °4. If you need a refill on your pain medication, please contact your pharmacy.  They will contact our office to request authorization. Prescriptions will not be filled after 5pm or on week-ends. ° °HOME MEDICATIONS °5. Take your usually prescribed medications unless otherwise directed. ° °DIET °6. You should follow a light diet the first few days after arrival home.  Be sure to include lots of fluids daily. Avoid fatty, fried foods.  ° °CONSTIPATION °7. It is common to experience some constipation after surgery and if you are taking pain medication.  Increasing fluid intake and taking a stool  softener (such as Colace) will usually help or prevent this problem from occurring.  A mild laxative (Milk of Magnesia or Miralax) should be taken according to package instructions if there are no bowel movements after 48 hours. ° °WOUND/INCISION CARE °8. Most patients will experience some swelling and bruising in the area of the incisions.  Ice packs will help.  Swelling and bruising can take several days to resolve.  °9. Unless discharge instructions indicate otherwise, follow guidelines below  °a. STERI-STRIPS - you may remove your outer bandages 48 hours after surgery, and you may shower at that time.  You have steri-strips (small skin tapes) in place directly over the incision.  These strips should be left on the skin for 7-10 days.   °b. DERMABOND/SKIN GLUE - you may shower in 24 hours.  The glue will flake off over the next 2-3 weeks. °10. Any sutures or staples will be removed at the office during your follow-up visit. ° °ACTIVITIES °11. You may resume regular (light) daily activities beginning the next day--such as daily self-care, walking, climbing stairs--gradually increasing activities as tolerated.  You may have sexual intercourse when it is comfortable.  Refrain from any heavy lifting or straining until approved by your doctor. °a. You may drive when you are no longer taking prescription pain medication, you can comfortably wear a seatbelt, and you can safely maneuver your car and apply brakes. ° °FOLLOW-UP °12. You should see your doctor in the office for a follow-up appointment approximately 2-3 weeks after your surgery.  You should have been given your post-op/follow-up appointment when   your surgery was scheduled.  If you did not receive a post-op/follow-up appointment, make sure that you call for this appointment within a day or two after you arrive home to insure a convenient appointment time. ° ° °WHEN TO CALL YOUR DOCTOR: °1. Fever over 101.0 °2. Inability to urinate °3. Continued bleeding from  incision. °4. Increased pain, redness, or drainage from the incision. °5. Increasing abdominal pain ° °The clinic staff is available to answer your questions during regular business hours.  Please don’t hesitate to call and ask to speak to one of the nurses for clinical concerns.  If you have a medical emergency, go to the nearest emergency room or call 911.  A surgeon from [facility] is always on call at the hospital. °[address], [address], [place], [place]  [address] ? [address] [address], [place], [place]   [address] °[phone] ? [phone] ? FAX [phone] ° °

## 2020-11-13 NOTE — ED Notes (Signed)
Unsuccessful attempt at iv insertion.  Second RN Ysidro Evert to attempt.
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
## 2020-11-13 NOTE — ED Notes (Signed)
Pt A&O x4. Attached to cardiac monitor x3. VSS. Pt denies any complaints or concerns at this time.

## 2020-11-13 NOTE — ED Notes (Signed)
Patient is resting comfortably.  NAD noted, vitals stable.

## 2020-11-13 NOTE — Anesthesia Procedure Notes (Signed)
Procedure Name: Intubation Date/Time: 11/13/2020 5:05 PM Performed by: Montel Clock, CRNA Pre-anesthesia Checklist: Patient identified, Emergency Drugs available, Suction available, Patient being monitored and Timeout performed Patient Re-evaluated:Patient Re-evaluated prior to induction Oxygen Delivery Method: Circle system utilized Preoxygenation: Pre-oxygenation with 100% oxygen Induction Type: IV induction Ventilation: Mask ventilation without difficulty Laryngoscope Size: Mac and 4 Grade View: Grade II Tube type: Oral Tube size: 7.5 mm Number of attempts: 1 Airway Equipment and Method: Stylet Placement Confirmation: ETT inserted through vocal cords under direct vision,  positive ETCO2 and breath sounds checked- equal and bilateral Secured at: 22 cm Tube secured with: Tape Dental Injury: Teeth and Oropharynx as per pre-operative assessment  Comments: ETT easily passed through cords.

## 2020-11-13 NOTE — Consult Note (Signed)
CC: RUQ pain  Requesting provider: Dr Karle Starch  HPI: Gabriela Best is an 40 y.o. female who is here for RUQ pain.  She states she has had pain after meal that occurs pretty regularly over the past month.  It is associated with nausea and sometimes vomiting.  Presented to ED last night with similar symptoms.  Past Medical History:  Diagnosis Date  . Migraine     Past Surgical History:  Procedure Laterality Date  . KNEE SURGERY    . KNEE SURGERY Left     No family history on file.  Social:  reports that she has been smoking cigarettes. She has been smoking about 0.15 packs per day. She has never used smokeless tobacco. She reports current alcohol use. She reports that she does not use drugs.  Allergies: No Known Allergies  Medications: I have reviewed the patient's current medications.  Results for orders placed or performed during the hospital encounter of 11/13/20 (from the past 48 hour(s))  CBC with Differential     Status: None   Collection Time: 11/13/20  4:00 AM  Result Value Ref Range   WBC 10.0 4.0 - 10.5 K/uL   RBC 4.67 3.87 - 5.11 MIL/uL   Hemoglobin 13.8 12.0 - 15.0 g/dL   HCT 43.2 36.0 - 46.0 %   MCV 92.5 80.0 - 100.0 fL   MCH 29.6 26.0 - 34.0 pg   MCHC 31.9 30.0 - 36.0 g/dL   RDW 14.0 11.5 - 15.5 %   Platelets 293 150 - 400 K/uL   nRBC 0.0 0.0 - 0.2 %   Neutrophils Relative % 75 %   Neutro Abs 7.5 1.7 - 7.7 K/uL   Lymphocytes Relative 17 %   Lymphs Abs 1.7 0.7 - 4.0 K/uL   Monocytes Relative 5 %   Monocytes Absolute 0.5 0.1 - 1.0 K/uL   Eosinophils Relative 3 %   Eosinophils Absolute 0.3 0.0 - 0.5 K/uL   Basophils Relative 0 %   Basophils Absolute 0.0 0.0 - 0.1 K/uL   Immature Granulocytes 0 %   Abs Immature Granulocytes 0.03 0.00 - 0.07 K/uL    Comment: Performed at [facility], [place] [address], [place], [place] [address]  Comprehensive metabolic panel     Status: Abnormal   Collection Time: 11/13/20  4:00 AM  Result Value Ref  Range   Sodium 137 135 - 145 mmol/L   Potassium 5.9 (H) 3.5 - 5.1 mmol/L   Chloride 104 98 - 111 mmol/L   CO2 26 22 - 32 mmol/L   Glucose, Bld 106 (H) 70 - 99 mg/dL    Comment: Glucose reference range applies only to samples taken after fasting for at least 8 hours.   BUN 12 6 - 20 mg/dL   Creatinine, Ser 0.86 0.44 - 1.00 mg/dL   Calcium 9.1 8.9 - 10.3 mg/dL   Total Protein 6.8 6.5 - 8.1 g/dL   Albumin 3.8 3.5 - 5.0 g/dL   AST 23 15 - 41 U/L   ALT 15 0 - 44 U/L   Alkaline Phosphatase 53 38 - 126 U/L   Total Bilirubin 0.7 0.3 - 1.2 mg/dL   GFR, Estimated >60 >60 mL/min    Comment: (NOTE) Calculated using the CKD-EPI Creatinine Equation (2021)    Anion gap 7 5 - 15    Comment: Performed at [facility], [place] [address] [place], [place] [address]  Lipase, blood     Status: None   Collection  Time: 11/13/20  4:00 AM  Result Value Ref Range   Lipase 30 11 - 51 U/L    Comment: Performed at [facility], [place] [address], [place],  [address]  I-Stat beta hCG blood, ED     Status: None   Collection Time: 11/13/20  4:27 AM  Result Value Ref Range   I-stat hCG, quantitative <5.0 <5 mIU/mL   Comment 3            Comment:   GEST. AGE      CONC.  (mIU/mL)   <=1 WEEK        5 - 50     2 WEEKS       50 - 500     3 WEEKS       100 - 10,000     4 WEEKS     1,000 - 30,000        FEMALE AND NON-PREGNANT FEMALE:     LESS THAN 5 mIU/mL     US Abdomen Limited  Result Date: 11/13/2020 CLINICAL DATA:  History of gallstones. EXAM: ULTRASOUND ABDOMEN LIMITED RIGHT UPPER QUADRANT COMPARISON:  CT 11/02/2020 FINDINGS: Gallbladder: Multiple stones demonstrated in the gallbladder. 1.7 cm nonmobile gallstone at the gallbladder neck. No gallbladder wall thickening. Murphy's sign is positive. Common bile duct: Diameter: 4 mm, normal Liver: No focal lesion identified. Within normal limits in parenchymal echogenicity. Portal vein is patent on color Doppler imaging  with normal direction of blood flow towards the liver. Other: None. IMPRESSION: Cholelithiasis with positive Murphy's sign. Changes are consistent with acute cholecystitis in the appropriate clinical setting. Electronically Signed   By: Lucienne Capers M.D.   On: 11/13/2020 02:28    ROS - all of the below systems have been reviewed with the patient and positives are indicated with bold text General: chills, fever or night sweats Eyes: blurry vision or double vision ENT: epistaxis or sore throat Allergy/Immunology: itchy/watery eyes or nasal congestion Hematologic/Lymphatic: bleeding problems, blood clots or swollen lymph nodes Endocrine: temperature intolerance or unexpected weight changes Breast: new or changing breast lumps or nipple discharge Resp: cough, shortness of breath, or wheezing CV: chest pain or dyspnea on exertion GI: as per HPI GU: dysuria, trouble voiding, or hematuria MSK: joint pain or joint stiffness Neuro: TIA or stroke symptoms Derm: pruritus and skin lesion changes Psych: anxiety and depression  PE Blood pressure 121/79, pulse 64, temperature 98.5 F (36.9 C), temperature source Oral, resp. rate 16, height 5\' 2"  (1.575 m), weight 125.6 kg, last menstrual period 10/27/2020, SpO2 100 %. Constitutional: NAD; conversant; no deformities Eyes: Moist conjunctiva; no lid lag; anicteric; PERRL Neck: Trachea midline; no thyromegaly Lungs: Normal respiratory effort; no tactile fremitus CV: RRR; no palpable thrills; no pitting edema GI: Abd mild RUQ TTP; no palpable hepatosplenomegaly MSK: Normal range of motion of extremities; no clubbing/cyanosis Psychiatric: Appropriate affect; alert and oriented x3 Lymphatic: No palpable cervical or axillary lymphadenopathy  Results for orders placed or performed during the hospital encounter of 11/13/20 (from the past 48 hour(s))  CBC with Differential     Status: None   Collection Time: 11/13/20  4:00 AM  Result Value Ref Range    WBC 10.0 4.0 - 10.5 K/uL   RBC 4.67 3.87 - 5.11 MIL/uL   Hemoglobin 13.8 12.0 - 15.0 g/dL   HCT 43.2 36.0 - 46.0 %   MCV 92.5 80.0 - 100.0 fL   MCH 29.6 26.0 - 34.0 pg   MCHC 31.9  30.0 - 36.0 g/dL   RDW 14.0 11.5 - 15.5 %   Platelets 293 150 - 400 K/uL   nRBC 0.0 0.0 - 0.2 %   Neutrophils Relative % 75 %   Neutro Abs 7.5 1.7 - 7.7 K/uL   Lymphocytes Relative 17 %   Lymphs Abs 1.7 0.7 - 4.0 K/uL   Monocytes Relative 5 %   Monocytes Absolute 0.5 0.1 - 1.0 K/uL   Eosinophils Relative 3 %   Eosinophils Absolute 0.3 0.0 - 0.5 K/uL   Basophils Relative 0 %   Basophils Absolute 0.0 0.0 - 0.1 K/uL   Immature Granulocytes 0 %   Abs Immature Granulocytes 0.03 0.00 - 0.07 K/uL    Comment: Performed at [facility], [place] [address], [place], [place] [address]  Comprehensive metabolic panel     Status: Abnormal   Collection Time: 11/13/20  4:00 AM  Result Value Ref Range   Sodium 137 135 - 145 mmol/L   Potassium 5.9 (H) 3.5 - 5.1 mmol/L   Chloride 104 98 - 111 mmol/L   CO2 26 22 - 32 mmol/L   Glucose, Bld 106 (H) 70 - 99 mg/dL    Comment: Glucose reference range applies only to samples taken after fasting for at least 8 hours.   BUN 12 6 - 20 mg/dL   Creatinine, Ser 0.86 0.44 - 1.00 mg/dL   Calcium 9.1 8.9 - 10.3 mg/dL   Total Protein 6.8 6.5 - 8.1 g/dL   Albumin 3.8 3.5 - 5.0 g/dL   AST 23 15 - 41 U/L   ALT 15 0 - 44 U/L   Alkaline Phosphatase 53 38 - 126 U/L   Total Bilirubin 0.7 0.3 - 1.2 mg/dL   GFR, Estimated >60 >60 mL/min    Comment: (NOTE) Calculated using the CKD-EPI Creatinine Equation (2021)    Anion gap 7 5 - 15    Comment: Performed at [facility], [place] [address], [place], [place] [address]  Lipase, blood     Status: None   Collection Time: 11/13/20  4:00 AM  Result Value Ref Range   Lipase 30 11 - 51 U/L    Comment: Performed at [facility], [place] [address], [place], [place] [address]  I-Stat beta hCG blood,  ED     Status: None   Collection Time: 11/13/20  4:27 AM  Result Value Ref Range   I-stat hCG, quantitative <5.0 <5 mIU/mL   Comment 3            Comment:   GEST. AGE      CONC.  (mIU/mL)   <=1 WEEK        5 - 50     2 WEEKS       50 - 500     3 WEEKS       100 - 10,000     4 WEEKS     1,000 - 30,000        FEMALE AND NON-PREGNANT FEMALE:     LESS THAN 5 mIU/mL     US Abdomen Limited  Result Date: 11/13/2020 CLINICAL DATA:  History of gallstones. EXAM: ULTRASOUND ABDOMEN LIMITED RIGHT UPPER QUADRANT COMPARISON:  CT 11/02/2020 FINDINGS: Gallbladder: Multiple stones demonstrated in the gallbladder. 1.7 cm nonmobile gallstone at the gallbladder neck. No gallbladder wall thickening. Murphy's sign is positive. Common bile duct: Diameter: 4 mm, normal Liver: No focal lesion identified. Within normal limits in parenchymal echogenicity. Portal vein  is patent on color Doppler imaging with normal direction of blood flow towards the liver. Other: None. IMPRESSION: Cholelithiasis with positive Murphy's sign. Changes are consistent with acute cholecystitis in the appropriate clinical setting. Electronically Signed   By: Lucienne Capers M.D.   On: 11/13/2020 02:28     A/P: Gabriela Best is an 39 y.o. female with symptomatic cholelithiasis.  She has no signs of cholecystitis (no GB wall thickening or pericholecystic fluid, normal LFTs and WBC), on her work up, and her pain has subsided.  Option was given to patient to be set up for outpatient lap chole vs being admitted and waiting for an OR here in the hospital.  Pt would like to discuss with family and we will check back with her later this am.   Rosario Adie, MD  [facility]

## 2020-11-13 NOTE — ED Notes (Signed)
Jeremy,RN at bedside to attempt IV insertion.
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
## 2020-11-14 ENCOUNTER — Encounter (HOSPITAL_COMMUNITY): Payer: Self-pay | Admitting: Surgery

## 2020-11-14 DIAGNOSIS — K3532 Acute appendicitis with perforation and localized peritonitis, without abscess: Secondary | ICD-10-CM | POA: Diagnosis present

## 2020-11-14 HISTORY — DX: Acute appendicitis with perforation, localized peritonitis, and gangrene, without abscess: K35.32

## 2020-11-14 LAB — CBC
HCT: 39.6 % (ref 36.0–46.0)
Hemoglobin: 13 g/dL (ref 12.0–15.0)
MCH: 29.7 pg (ref 26.0–34.0)
MCHC: 32.8 g/dL (ref 30.0–36.0)
MCV: 90.4 fL (ref 80.0–100.0)
Platelets: 304 10*3/uL (ref 150–400)
RBC: 4.38 MIL/uL (ref 3.87–5.11)
RDW: 13.6 % (ref 11.5–15.5)
WBC: 12 10*3/uL — ABNORMAL HIGH (ref 4.0–10.5)
nRBC: 0 % (ref 0.0–0.2)

## 2020-11-14 LAB — COMPREHENSIVE METABOLIC PANEL
ALT: 40 U/L (ref 0–44)
AST: 40 U/L (ref 15–41)
Albumin: 3.5 g/dL (ref 3.5–5.0)
Alkaline Phosphatase: 56 U/L (ref 38–126)
Anion gap: 8 (ref 5–15)
BUN: 9 mg/dL (ref 6–20)
CO2: 24 mmol/L (ref 22–32)
Calcium: 8.9 mg/dL (ref 8.9–10.3)
Chloride: 105 mmol/L (ref 98–111)
Creatinine, Ser: 0.83 mg/dL (ref 0.44–1.00)
GFR, Estimated: >60 mL/min (ref >60)
Glucose, Bld: 127 mg/dL — ABNORMAL HIGH (ref 70–99)
Potassium: 4.7 mmol/L (ref 3.5–5.1)
Sodium: 137 mmol/L (ref 135–145)
Total Bilirubin: 0.1 mg/dL — ABNORMAL LOW (ref 0.3–1.2)
Total Protein: 6.8 g/dL (ref 6.5–8.1)

## 2020-11-14 LAB — HIV ANTIBODY (ROUTINE TESTING W REFLEX): HIV Screen 4th Generation wRfx: NONREACTIVE

## 2020-11-14 MED ORDER — ACETAMINOPHEN 500 MG PO TABS
1000.0000 mg | ORAL_TABLET | Freq: Three times a day (TID) | ORAL | Status: DC
  Administered 2020-11-14: 1000 mg via ORAL
  Filled 2020-11-14: qty 2

## 2020-11-14 MED ORDER — OXYCODONE HCL 5 MG PO TABS: 5.0000 mg | ORAL_TABLET | Freq: Four times a day (QID) | ORAL | 0 refills | Status: DC | PRN

## 2020-11-14 MED ORDER — OXYCODONE-ACETAMINOPHEN 5-325 MG PO TABS: 1.0000 | ORAL_TABLET | ORAL | 0 refills | Status: DC | PRN

## 2020-11-14 MED ORDER — LIP MEDEX EX OINT
TOPICAL_OINTMENT | CUTANEOUS | Status: AC
  Filled 2020-11-14: qty 7

## 2020-11-14 MED ORDER — MORPHINE SULFATE (PF) 2 MG/ML IV SOLN: 2.0000 mg | INTRAVENOUS | Status: DC | PRN

## 2020-11-14 MED ORDER — IBUPROFEN 800 MG PO TABS: 800.0000 mg | ORAL_TABLET | Freq: Three times a day (TID) | ORAL | 0 refills | Status: DC

## 2020-11-14 MED ORDER — ACETAMINOPHEN 500 MG PO TABS: 500.0000 mg | ORAL_TABLET | Freq: Four times a day (QID) | ORAL | 0 refills | Status: AC | PRN

## 2020-11-14 MED ORDER — ACETAMINOPHEN 500 MG PO TABS: 500.0000 mg | ORAL_TABLET | Freq: Four times a day (QID) | ORAL | 0 refills | Status: DC | PRN

## 2020-11-14 MED ORDER — GUAIFENESIN 100 MG/5ML PO SOLN
5.0000 mL | ORAL | Status: DC | PRN
  Administered 2020-11-14: 100 mg via ORAL
  Filled 2020-11-14: qty 10

## 2020-11-14 NOTE — TOC Transition Note (Signed)
Transition of Care [facility]) - CM/SW Discharge Note  Patient Details  Name: Gabriela Best MRN: [id number] Date of Birth: [birth year]  [facility] CM/SW Contact:  Sherie Don, LCSW Phone Number: 11/14/2020, 11:42 AM  Clinical Narrative: Patient expected to discharge home today. TOC received consult for medication assistance. CSW provided patient with voucher. Patient verbalized understand of $3.00 copay per medication. Patient requesting medications be sent to [facility] on [address] in [place]. TOC signing off.  Final next level of care: Home/Self Care Barriers to Discharge: Barriers Resolved  Patient Goals and CMS Choice Patient states their goals for this hospitalization and ongoing recovery are:: Discharge home with [facility] voucher Choice offered to / list presented to : NA  Discharge Plan and Services        DME Arranged: N/A DME Agency: NA  Readmission Risk Interventions No flowsheet data found.

## 2020-11-14 NOTE — Discharge Summary (Signed)
  Patient ID: Gabriela Best [id number] 40 y.o. January 27, 1981  11/13/2020  Discharge date and time: 11/14/2020  Admitting Physician: [place]  Discharge Physician: [place]  Admission Diagnoses: Cholecystitis, acute [K81.0] Symptomatic cholelithiasis [K80.20] Acute perforated appendicitis [K35.32] Patient Active Problem List   Diagnosis Date Noted  . Acute perforated appendicitis 11/14/2020  . Symptomatic cholelithiasis 11/13/2020     Discharge Diagnoses: Cholecystitis Patient Active Problem List   Diagnosis Date Noted  . Acute perforated appendicitis 11/14/2020  . Symptomatic cholelithiasis 11/13/2020    Operations: Procedure(s): LAPAROSCOPIC CHOLECYSTECTOMY WITH INTRAOPERATIVE CHOLANGIOGRAM  Admission Condition: good  Discharged Condition: good  Indication for Admission: Cholecystitis  Hospital Course: Ms. Gabriela Best was admitted with cholecystitis.  Underwent laparoscopic cholecystectomy with intraoperative cholangiogram on 11/13/20 and was discharged the following day.  Consults: None  Significant Diagnostic Studies: none  Treatments: surgery: as above  Disposition: Home  Patient Instructions:  Allergies as of 11/14/2020   No Known Allergies     Medication List    STOP taking these medications   lidocaine 5 % Commonly known as: Lidoderm     TAKE these medications   acetaminophen 500 MG tablet Commonly known as: TYLENOL Take 1 tablet (500 mg total) by mouth every 6 (six) hours as needed.   hydrocortisone cream 1 % Apply 1 application topically 4 (four) times daily as needed for itching (eczema).   ibuprofen 800 MG tablet Commonly known as: ADVIL Take 1 tablet (800 mg total) by mouth every 8 (eight) hours for 7 days.   oxyCODONE-acetaminophen 5-325 MG tablet Commonly known as: Percocet Take 1 tablet by mouth every 4 (four) hours as needed for severe pain. What changed: when to take this       Activity: activity as  tolerated Diet: regular diet Wound Care: keep wound clean and dry  Follow-up:  With Dr. Thermon Leyland in 3 weeks.  Signed: [place], [place]   11/14/2020, 4:06 PM

## 2020-11-14 NOTE — Progress Notes (Signed)
Discharge instructions given to patient and all questions were answered.  

## 2020-11-14 NOTE — Progress Notes (Signed)
1 Day Post-Op  Subjective: CC: Patient reports that she has some soreness/burning around her incisions, especially the right upper ones. She feels this is well controlled with pain medication. She denies n/v. She has not had anything to eat or drink since surgery other than sips with meds (was NPO this am). She has gotten up to the bedside commode but otherwise has not mobilized. She is voiding without difficulty.   Objective: Vital signs in last 24 hours: Temp:  [97.6 F (36.4 C)-99.5 F (37.5 C)] 98.6 F (37 C) (04/29 0451) Pulse Rate:  [60-104] 88 (04/29 0451) Resp:  [14-19] 18 (04/29 0451) BP: (129-188)/(60-112) 137/91 (04/29 0451) SpO2:  [95 %-100 %] 100 % (04/29 0451) Last BM Date: 11/13/20  Intake/Output from previous day: 04/28 0701 - 04/29 0700 In: 1788.3 [I.V.:1688.3; IV Piggyback:100] Out: 1210 [Urine:1200; Blood:10] Intake/Output this shift: No intake/output data recorded.  PE: Gen:  Alert, NAD, pleasant Card:  RRR Pulm:  CTAB, no W/R/R, effort normal Abd: Soft, ND, mild and appropriately tender around laparoscopic incisions without peritonitis. Otherwise NT. +BS. Incisions with glue intact appears well and are without drainage, bleeding, or signs of infection Ext:  No LE edema  Psych: A&Ox3  Skin: no rashes noted, warm and dry   Lab Results:  Recent Labs    11/13/20 0400 11/14/20 0440  WBC 10.0 12.0*  HGB 13.8 13.0  HCT 43.2 39.6  PLT 293 304   BMET Recent Labs    11/13/20 0400 11/13/20 0923 11/14/20 0440  NA 137  --  137  K 5.9* 4.1 4.7  CL 104  --  105  CO2 26  --  24  GLUCOSE 106*  --  127*  BUN 12  --  9  CREATININE 0.86  --  0.83  CALCIUM 9.1  --  8.9   PT/INR No results for input(s): LABPROT, INR in the last 72 hours. CMP     Component Value Date/Time   NA 137 11/14/2020 0440   K 4.7 11/14/2020 0440   CL 105 11/14/2020 0440   CO2 24 11/14/2020 0440   GLUCOSE 127 (H) 11/14/2020 0440   BUN 9 11/14/2020 0440   CREATININE 0.83  11/14/2020 0440   CALCIUM 8.9 11/14/2020 0440   PROT 6.8 11/14/2020 0440   ALBUMIN 3.5 11/14/2020 0440   AST 40 11/14/2020 0440   ALT 40 11/14/2020 0440   ALKPHOS 56 11/14/2020 0440   BILITOT 0.1 (L) 11/14/2020 0440   GFRNONAA >60 11/14/2020 0440   GFRAA >60 04/12/2020 1131   Lipase     Component Value Date/Time   LIPASE 30 11/13/2020 0400       Studies/Results: DG Cholangiogram Operative  Result Date: 11/14/2020 CLINICAL DATA:  Intraoperative cholangiogram during laparoscopic cholecystectomy. EXAM: INTRAOPERATIVE CHOLANGIOGRAM FLUOROSCOPY TIME:  12 seconds (7.4 mGy) COMPARISON:  Right upper quadrant abdominal ultrasound-11/13/2020 FINDINGS: Intraoperative cholangiographic images of the right upper abdominal quadrant during laparoscopic cholecystectomy are provided for review. Surgical clips overlie the expected location of the gallbladder fossa. Contrast injection demonstrates selective cannulation of the central aspect of the cystic duct. There is passage of contrast through the central aspect of the cystic duct with filling of a non dilated common bile duct. There is passage of contrast though the CBD and into the descending portion of the duodenum. There is minimal reflux of injected contrast into the common hepatic duct and central aspect of the non dilated intrahepatic biliary system. There is minimal opacification the central most aspect of  the pancreatic duct. There are no discrete filling defects within the opacified portions of the biliary system to suggest the presence of choledocholithiasis. IMPRESSION: No evidence of choledocholithiasis. Electronically Signed   By: Sandi Mariscal M.D.   On: 11/14/2020 07:51   US Abdomen Limited  Result Date: 11/13/2020 CLINICAL DATA:  History of gallstones. EXAM: ULTRASOUND ABDOMEN LIMITED RIGHT UPPER QUADRANT COMPARISON:  CT 11/02/2020 FINDINGS: Gallbladder: Multiple stones demonstrated in the gallbladder. 1.7 cm nonmobile gallstone at the  gallbladder neck. No gallbladder wall thickening. Murphy's sign is positive. Common bile duct: Diameter: 4 mm, normal Liver: No focal lesion identified. Within normal limits in parenchymal echogenicity. Portal vein is patent on color Doppler imaging with normal direction of blood flow towards the liver. Other: None. IMPRESSION: Cholelithiasis with positive Murphy's sign. Changes are consistent with acute cholecystitis in the appropriate clinical setting. Electronically Signed   By: Lucienne Capers M.D.   On: 11/13/2020 02:28    Anti-infectives: Anti-infectives (From admission, onward)   Start     Dose/Rate Route Frequency Ordered Stop   11/13/20 0945  cefTRIAXone (ROCEPHIN) 2 g in sodium chloride 0.9 % 100 mL IVPB        2 g 200 mL/hr over 30 Minutes Intravenous Every 24 hours 11/13/20 0932     11/13/20 0900  cefTRIAXone (ROCEPHIN) 2 g in sodium chloride 0.9 % 100 mL IVPB  Status:  Discontinued        2 g 200 mL/hr over 30 Minutes Intravenous On call to O.R. 11/13/20 0854 11/13/20 1157       Assessment/Plan Hyperkalemia - resolved. K 4.7 this am.   Symptomatic Cholecystitis S/p Laparoscopic Cholecystectomy with IOC - Dr. Thermon Leyland - 11/13/2020 - POD #1 - IOC appears negative. Await final read. LFT's wnl.  - Adv diet - Mobilize - Pulm toilet - PM check for possible d/c if tolerating diet, pain well controlled and mobilizing.   FEN - CLD (adv as tolerated to reg), IVF (d/c when tolerating diet) VTE - SCDs, Lovenox  ID - Rocephin 4/28 - 4/29.    LOS: 0 days    Jillyn Ledger , [facility] 11/14/2020, 8:35 AM Please see Amion for pager number during day hours 7:00am-4:30pm

## 2020-11-14 NOTE — Progress Notes (Signed)
Called [facility] on [address] and cancelled medications. [facility]

## 2020-11-17 LAB — SURGICAL PATHOLOGY

## 2021-01-23 ENCOUNTER — Emergency Department (HOSPITAL_COMMUNITY)
Admission: EM | Admit: 2021-01-23 | Discharge: 2021-01-23 | Disposition: A | Discharge status: Home / Self Care | Payer: Worker's Compensation | Attending: Emergency Medicine | Admitting: Emergency Medicine

## 2021-01-23 ENCOUNTER — Encounter (HOSPITAL_COMMUNITY): Payer: Self-pay

## 2021-01-23 ENCOUNTER — Other Ambulatory Visit: Payer: Self-pay

## 2021-01-23 DIAGNOSIS — F17210 Nicotine dependence, cigarettes, uncomplicated: Secondary | ICD-10-CM | POA: Diagnosis not present

## 2021-01-23 DIAGNOSIS — M542 Cervicalgia: Secondary | ICD-10-CM | POA: Diagnosis not present

## 2021-01-23 DIAGNOSIS — Y92410 Unspecified street and highway as the place of occurrence of the external cause: Secondary | ICD-10-CM | POA: Diagnosis not present

## 2021-01-23 DIAGNOSIS — G8929 Other chronic pain: Secondary | ICD-10-CM | POA: Diagnosis not present

## 2021-01-23 DIAGNOSIS — M545 Low back pain, unspecified: Secondary | ICD-10-CM | POA: Diagnosis not present

## 2021-01-23 DIAGNOSIS — M791 Myalgia, unspecified site: Secondary | ICD-10-CM | POA: Insufficient documentation

## 2021-01-23 DIAGNOSIS — M7918 Myalgia, other site: Secondary | ICD-10-CM

## 2021-01-23 MED ORDER — HYDROCODONE-ACETAMINOPHEN 5-325 MG PO TABS: 1.0000 | ORAL_TABLET | Freq: Two times a day (BID) | ORAL | 0 refills | Status: DC

## 2021-01-23 NOTE — Discharge Instructions (Signed)
Prescription given for Norco. Take medication as directed and do not operate machinery, drive a car, or work while taking this medication as it can make you drowsy.   Follow up with your orthopedist in the next 5-7 days  Return to the emergency department immediately if you experience any back pain associated with fevers, loss of control of your bowels/bladder, weakness/numbness to your legs, numbness to your groin area, inability to walk, or inability to urinate.
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
## 2021-08-15 ENCOUNTER — Emergency Department (HOSPITAL_COMMUNITY): Payer: Self-pay

## 2021-08-15 ENCOUNTER — Other Ambulatory Visit: Payer: Self-pay

## 2021-08-15 ENCOUNTER — Encounter (HOSPITAL_COMMUNITY): Payer: Self-pay | Admitting: *Deleted

## 2021-08-15 ENCOUNTER — Emergency Department (HOSPITAL_COMMUNITY)
Admission: EM | Admit: 2021-08-15 | Discharge: 2021-08-15 | Disposition: A | Discharge status: Home / Self Care | Payer: Self-pay | Attending: Emergency Medicine | Admitting: Emergency Medicine

## 2021-08-15 DIAGNOSIS — R102 Pelvic and perineal pain: Secondary | ICD-10-CM | POA: Insufficient documentation

## 2021-08-15 DIAGNOSIS — N83519 Torsion of ovary and ovarian pedicle, unspecified side: Secondary | ICD-10-CM

## 2021-08-15 HISTORY — DX: Irregular menstruation, unspecified: N92.6

## 2021-08-15 LAB — CBC WITH DIFFERENTIAL/PLATELET
Abs Immature Granulocytes: 0.01 10*3/uL (ref 0.00–0.07)
Basophils Absolute: 0.1 10*3/uL (ref 0.0–0.1)
Basophils Relative: 1 %
Eosinophils Absolute: 0.4 10*3/uL (ref 0.0–0.5)
Eosinophils Relative: 4 %
HCT: 38.7 % (ref 36.0–46.0)
Hemoglobin: 12.7 g/dL (ref 12.0–15.0)
Immature Granulocytes: 0 %
Lymphocytes Relative: 34 %
Lymphs Abs: 3.1 10*3/uL (ref 0.7–4.0)
MCH: 29.5 pg (ref 26.0–34.0)
MCHC: 32.8 g/dL (ref 30.0–36.0)
MCV: 89.8 fL (ref 80.0–100.0)
Monocytes Absolute: 0.4 10*3/uL (ref 0.1–1.0)
Monocytes Relative: 4 %
Neutro Abs: 5.2 10*3/uL (ref 1.7–7.7)
Neutrophils Relative %: 57 %
Platelets: 370 10*3/uL (ref 150–400)
RBC: 4.31 MIL/uL (ref 3.87–5.11)
RDW: 13.4 % (ref 11.5–15.5)
WBC: 9.1 10*3/uL (ref 4.0–10.5)
nRBC: 0 % (ref 0.0–0.2)

## 2021-08-15 LAB — WET PREP, GENITAL
Clue Cells Wet Prep HPF POC: NONE SEEN
Sperm: NONE SEEN
Trich, Wet Prep: NONE SEEN
WBC, Wet Prep HPF POC: <10 (ref <10)
Yeast Wet Prep HPF POC: NONE SEEN

## 2021-08-15 LAB — COMPREHENSIVE METABOLIC PANEL
ALT: 12 U/L (ref 0–44)
AST: 13 U/L — ABNORMAL LOW (ref 15–41)
Albumin: 3.5 g/dL (ref 3.5–5.0)
Alkaline Phosphatase: 63 U/L (ref 38–126)
Anion gap: 8 (ref 5–15)
BUN: 11 mg/dL (ref 6–20)
CO2: 23 mmol/L (ref 22–32)
Calcium: 9.2 mg/dL (ref 8.9–10.3)
Chloride: 104 mmol/L (ref 98–111)
Creatinine, Ser: 0.82 mg/dL (ref 0.44–1.00)
GFR, Estimated: >60 mL/min (ref >60)
Glucose, Bld: 98 mg/dL (ref 70–99)
Potassium: 4.2 mmol/L (ref 3.5–5.1)
Sodium: 135 mmol/L (ref 135–145)
Total Bilirubin: 0.3 mg/dL (ref 0.3–1.2)
Total Protein: 6.3 g/dL — ABNORMAL LOW (ref 6.5–8.1)

## 2021-08-15 LAB — I-STAT BETA HCG BLOOD, ED (MC, WL, AP ONLY): I-stat hCG, quantitative: <5 m[IU]/mL (ref <5)

## 2021-08-15 LAB — I-STAT CHEM 8, ED
BUN: 13 mg/dL (ref 6–20)
Calcium, Ion: 1.21 mmol/L (ref 1.15–1.40)
Chloride: 104 mmol/L (ref 98–111)
Creatinine, Ser: 0.8 mg/dL (ref 0.44–1.00)
Glucose, Bld: 94 mg/dL (ref 70–99)
HCT: 39 % (ref 36.0–46.0)
Hemoglobin: 13.3 g/dL (ref 12.0–15.0)
Potassium: 4.2 mmol/L (ref 3.5–5.1)
Sodium: 139 mmol/L (ref 135–145)
TCO2: 26 mmol/L (ref 22–32)

## 2021-08-15 LAB — LIPASE, BLOOD: Lipase: 33 U/L (ref 11–51)

## 2021-08-15 MED ORDER — HYDROCODONE-ACETAMINOPHEN 5-325 MG PO TABS
1.0000 | ORAL_TABLET | Freq: Once | ORAL | Status: AC
Start: 2021-08-15 — End: 2021-08-15
  Administered 2021-08-15: 1 via ORAL
  Filled 2021-08-15: qty 1

## 2021-08-15 MED ORDER — IBUPROFEN 800 MG PO TABS: 800.0000 mg | ORAL_TABLET | Freq: Three times a day (TID) | ORAL | 0 refills | Status: DC | PRN

## 2021-08-15 MED ORDER — TRAMADOL HCL 50 MG PO TABS: 50.0000 mg | ORAL_TABLET | Freq: Four times a day (QID) | ORAL | 0 refills | Status: DC | PRN

## 2021-08-15 MED ORDER — SODIUM CHLORIDE 0.9 % IV BOLUS
1000.0000 mL | Freq: Once | INTRAVENOUS | Status: AC
  Administered 2021-08-15: 1000 mL via INTRAVENOUS

## 2021-08-15 MED ORDER — FENTANYL CITRATE PF 50 MCG/ML IJ SOSY
50.0000 ug | PREFILLED_SYRINGE | Freq: Once | INTRAMUSCULAR | Status: AC
  Administered 2021-08-15: 50 ug via INTRAVENOUS
  Filled 2021-08-15: qty 1

## 2021-08-15 NOTE — ED Triage Notes (Signed)
Pt states acute onset  lower abd/pelvic pain this afternoon.  Pain reminds her of last time she had an ovarian cyst.  Denies vaginal bleeding. LMP ended on Wed.

## 2021-08-15 NOTE — ED Notes (Signed)
To ct

## 2021-08-15 NOTE — ED Provider Notes (Signed)
[facility] EMERGENCY DEPARTMENT Provider Note   CSN: [id number] Arrival date & time: 08/15/21  1623     History  Chief Complaint  Patient presents with   Abdominal Pain    Gabriela Best is a 41 y.o. female history of ovarian cyst, previous cholecystectomy here presenting with lower abdominal pain.  Patient states that she was in the shower and had a cute onset of lower pelvic pain.  She states that this is similar to her previous ovarian cyst in the past.  Patient denies any vaginal bleeding but is not sure if she has any discharge right now.  Patient states that she did not require any surgery for the ovarian cyst.  Denies any history of kidney stones in the past  The history is provided by the patient.      Home Medications Prior to Admission medications   Medication Sig Start Date End Date Taking? Authorizing Provider  acetaminophen (TYLENOL) 500 MG tablet Take 1 tablet (500 mg total) by mouth every 6 (six) hours as needed. 11/14/20 11/14/21  Maczis, Barth Kirks, PA-C  HYDROcodone-acetaminophen (NORCO/VICODIN) 5-325 MG tablet Take 1 tablet by mouth every 12 (twelve) hours. 01/23/21   Couture, Cortni S, PA-C  hydrocortisone cream 1 % Apply 1 application topically 4 (four) times daily as needed for itching (eczema).    [provider]  oxyCODONE (OXY IR/ROXICODONE) 5 MG immediate release tablet Take 1 tablet (5 mg total) by mouth every 6 (six) hours as needed for breakthrough pain. 11/14/20   Maczis, Barth Kirks, PA-C      Allergies    Patient has no known allergies.    Review of Systems   Review of Systems  Gastrointestinal:  Positive for abdominal pain.  All other systems reviewed and are negative.  Physical Exam Updated Vital Signs BP 130/84 (BP Location: Left Arm)    Pulse 79    Temp 98.4 F (36.9 C) (Oral)    Resp 16    Ht 5\' 2"  (1.575 m)    Wt 123.8 kg    LMP 08/06/2021    SpO2 98%    BMI 49.93 kg/m  Physical Exam Vitals and nursing note reviewed.   Constitutional:      Comments: Uncomfortable, writhing around in pain  HENT:     Head: Normocephalic.     Mouth/Throat:     Mouth: Mucous membranes are moist.  Eyes:     Extraocular Movements: Extraocular movements intact.     Pupils: Pupils are equal, round, and reactive to light.  Cardiovascular:     Rate and Rhythm: Normal rate and regular rhythm.     Heart sounds: Normal heart sounds.  Pulmonary:     Effort: Pulmonary effort is normal.     Breath sounds: Normal breath sounds.  Abdominal:     General: Abdomen is flat.     Comments: Mild diffuse pelvic tenderness  Genitourinary:    Comments: Patient has mild CMT.  Left adnexal tenderness  Skin:    General: Skin is warm.     Capillary Refill: Capillary refill takes less than 2 seconds.  Neurological:     General: No focal deficit present.     Mental Status: She is alert and oriented to person, place, and time.    ED Results / Procedures / Treatments   Labs (all labs ordered are listed, but only abnormal results are displayed) Labs Reviewed  WET PREP, GENITAL  CBC WITH DIFFERENTIAL/PLATELET  COMPREHENSIVE METABOLIC PANEL  LIPASE,  BLOOD  URINALYSIS, ROUTINE W REFLEX MICROSCOPIC  I-STAT BETA HCG BLOOD, ED ([facility], [facility], [facility] ONLY)  GC/CHLAMYDIA PROBE AMP ([facility]) NOT AT [facility]    EKG None  Radiology No results found.  Procedures Procedures    Medications Ordered in ED Medications  fentaNYL (SUBLIMAZE) injection 50 mcg (has no administration in time range)    ED Course/ Medical Decision Making/ A&P                           Medical Decision Making Gabriela Best is a 41 y.o. female here presenting with acute onset of lower pelvic pain.  Consider ovarian cyst versus torsion versus renal colic.  Plan to get labs and UA and ultrasound renal and ovarian ultrasound.  Will give pain medicine and reassess  7:59 PM Patient's ultrasound did not show any torsion or ovarian cyst.  CT abdomen pelvis performed and did not  show any acute findings.  Labs unremarkable.  In particular, patient does not have any obvious hydronephrosis to suggest renal colic.  At this point, she is stable for discharge.  Amount and/or Complexity of Data Reviewed External Data Reviewed: notes. Labs: ordered. Decision-making details documented in ED Course. Radiology: ordered and independent interpretation performed. Decision-making details documented in ED Course.  Risk Prescription drug management.  Final Clinical Impression(s) / ED Diagnoses Final diagnoses:  None    Rx / DC Orders ED Discharge Orders     None         Drenda Freeze, MD 08/15/21 2000

## 2021-08-15 NOTE — Discharge Instructions (Addendum)
Your CT scan and your ultrasound today did not show any cysts in your ovary or any acute process in your abdomen.  It is very possible that you have strained a muscle.  Please take ibuprofen 800 mg every 8 hours for pain  I have prescribed tramadol as needed for severe pain  See your doctor for follow-up  Return to ER if you have worse pelvic pain, abdominal pain, vomiting, vaginal bleeding, fevers

## 2021-08-17 LAB — GC/CHLAMYDIA PROBE AMP (~~LOC~~) NOT AT ARMC
Chlamydia: NEGATIVE
Comment: NEGATIVE
Comment: NORMAL
Neisseria Gonorrhea: NEGATIVE

## 2022-01-16 ENCOUNTER — Emergency Department (HOSPITAL_COMMUNITY)
Admission: EM | Admit: 2022-01-16 | Discharge: 2022-01-16 | Disposition: A | Discharge status: Home / Self Care | Payer: Medicaid Other | Attending: Emergency Medicine | Admitting: Emergency Medicine

## 2022-01-16 ENCOUNTER — Other Ambulatory Visit: Payer: Self-pay

## 2022-01-16 ENCOUNTER — Encounter (HOSPITAL_COMMUNITY): Payer: Self-pay | Admitting: Emergency Medicine

## 2022-01-16 DIAGNOSIS — R197 Diarrhea, unspecified: Secondary | ICD-10-CM | POA: Insufficient documentation

## 2022-01-16 DIAGNOSIS — R143 Flatulence: Secondary | ICD-10-CM | POA: Insufficient documentation

## 2022-01-16 DIAGNOSIS — N9489 Other specified conditions associated with female genital organs and menstrual cycle: Secondary | ICD-10-CM | POA: Insufficient documentation

## 2022-01-16 DIAGNOSIS — R1030 Lower abdominal pain, unspecified: Secondary | ICD-10-CM | POA: Insufficient documentation

## 2022-01-16 LAB — URINALYSIS, ROUTINE W REFLEX MICROSCOPIC
Bilirubin Urine: NEGATIVE
Glucose, UA: NEGATIVE mg/dL
Hgb urine dipstick: NEGATIVE
Ketones, ur: NEGATIVE mg/dL
Leukocytes,Ua: NEGATIVE
Nitrite: NEGATIVE
Protein, ur: NEGATIVE mg/dL
Specific Gravity, Urine: 1.029 (ref 1.005–1.030)
pH: 5 (ref 5.0–8.0)

## 2022-01-16 LAB — COMPREHENSIVE METABOLIC PANEL
ALT: 12 U/L (ref 0–44)
AST: 13 U/L — ABNORMAL LOW (ref 15–41)
Albumin: 3.4 g/dL — ABNORMAL LOW (ref 3.5–5.0)
Alkaline Phosphatase: 57 U/L (ref 38–126)
Anion gap: 7 (ref 5–15)
BUN: 15 mg/dL (ref 6–20)
CO2: 23 mmol/L (ref 22–32)
Calcium: 8.7 mg/dL — ABNORMAL LOW (ref 8.9–10.3)
Chloride: 108 mmol/L (ref 98–111)
Creatinine, Ser: 0.9 mg/dL (ref 0.44–1.00)
GFR, Estimated: >60 mL/min (ref >60)
Glucose, Bld: 103 mg/dL — ABNORMAL HIGH (ref 70–99)
Potassium: 4 mmol/L (ref 3.5–5.1)
Sodium: 138 mmol/L (ref 135–145)
Total Bilirubin: 0.4 mg/dL (ref 0.3–1.2)
Total Protein: 6.3 g/dL — ABNORMAL LOW (ref 6.5–8.1)

## 2022-01-16 LAB — CBC
HCT: 35 % — ABNORMAL LOW (ref 36.0–46.0)
Hemoglobin: 11 g/dL — ABNORMAL LOW (ref 12.0–15.0)
MCH: 27 pg (ref 26.0–34.0)
MCHC: 31.4 g/dL (ref 30.0–36.0)
MCV: 86 fL (ref 80.0–100.0)
Platelets: 390 10*3/uL (ref 150–400)
RBC: 4.07 MIL/uL (ref 3.87–5.11)
RDW: 15.1 % (ref 11.5–15.5)
WBC: 10.2 10*3/uL (ref 4.0–10.5)
nRBC: 0 % (ref 0.0–0.2)

## 2022-01-16 LAB — LIPASE, BLOOD: Lipase: 38 U/L (ref 11–51)

## 2022-01-16 LAB — PREGNANCY, URINE: Preg Test, Ur: NEGATIVE

## 2022-01-16 LAB — I-STAT BETA HCG BLOOD, ED (MC, WL, AP ONLY): I-stat hCG, quantitative: <5 m[IU]/mL (ref <5)

## 2022-01-16 MED ORDER — KETOROLAC TROMETHAMINE 15 MG/ML IJ SOLN
15.0000 mg | Freq: Once | INTRAMUSCULAR | Status: AC
  Administered 2022-01-16: 15 mg via INTRAVENOUS
  Filled 2022-01-16: qty 1

## 2022-01-16 NOTE — ED Triage Notes (Signed)
PT reported to ED with c/o pain to lower abdomen x2 days. Pt states that she feels "pressure in bladder" but denies urinary pain, frequency or burning.

## 2022-01-16 NOTE — ED Provider Notes (Signed)
[facility] EMERGENCY DEPARTMENT Provider Note  CSN: [id number] Arrival date & time: 01/16/22 0032  Chief Complaint(s) Abdominal Pain  HPI Gabriela Best is a 41 y.o. female     Abdominal Pain Pain location:  Suprapubic Pain quality: aching   Pain radiates to:  Does not radiate Pain severity:  Moderate Onset quality:  Gradual Duration:  2 days Progression:  Unchanged Chronicity:  New Relieved by:  Nothing Worsened by:  Nothing Associated symptoms: diarrhea and flatus   Associated symptoms: no constipation, no melena, no nausea, no vaginal bleeding, no vaginal discharge and no vomiting    LMP: 6/13   Past Medical History Past Medical History:  Diagnosis Date   Abnormal menstrual cycle    Migraine    Patient Active Problem List   Diagnosis Date Noted   Acute perforated appendicitis 11/14/2020   Symptomatic cholelithiasis 11/13/2020   Home Medication(s) Prior to Admission medications   Medication Sig Start Date End Date Taking? Authorizing Provider  HYDROcodone-acetaminophen (NORCO/VICODIN) 5-325 MG tablet Take 1 tablet by mouth every 12 (twelve) hours. 01/23/21   Couture, Cortni S, PA-C  hydrocortisone cream 1 % Apply 1 application topically 4 (four) times daily as needed for itching (eczema).    [provider]  ibuprofen (ADVIL) 800 MG tablet Take 1 tablet (800 mg total) by mouth every 8 (eight) hours as needed. 08/15/21   Drenda Freeze, MD  oxyCODONE (OXY IR/ROXICODONE) 5 MG immediate release tablet Take 1 tablet (5 mg total) by mouth every 6 (six) hours as needed for breakthrough pain. 11/14/20   Maczis, Barth Kirks, PA-C  traMADol (ULTRAM) 50 MG tablet Take 1 tablet (50 mg total) by mouth every 6 (six) hours as needed. 08/15/21   Drenda Freeze, MD                                                                                                                                    Allergies Patient has no known allergies.  Review of  Systems Review of Systems  Gastrointestinal:  Positive for abdominal pain, diarrhea and flatus. Negative for constipation, melena, nausea and vomiting.  Genitourinary:  Negative for vaginal bleeding and vaginal discharge.   As noted in HPI  Physical Exam Vital Signs  I have reviewed the triage vital signs BP 138/75   Pulse 77   Temp 98.7 F (37.1 C) (Oral)   Resp 19   SpO2 99%   Physical Exam Vitals reviewed.  Constitutional:      General: She is not in acute distress.    Appearance: She is well-developed. She is obese. She is not diaphoretic.  HENT:     Head: Normocephalic and atraumatic.     Right Ear: External ear normal.     Left Ear: External ear normal.     Nose: Nose normal.  Eyes:     General: No scleral icterus.    Conjunctiva/sclera: Conjunctivae  normal.  Neck:     Trachea: Phonation normal.  Cardiovascular:     Rate and Rhythm: Normal rate and regular rhythm.  Pulmonary:     Effort: Pulmonary effort is normal. No respiratory distress.     Breath sounds: No stridor.  Abdominal:     General: There is no distension.     Tenderness: There is abdominal tenderness in the suprapubic area.     Hernia: No hernia is present.  Musculoskeletal:        General: Normal range of motion.     Cervical back: Normal range of motion.  Neurological:     Mental Status: She is alert and oriented to person, place, and time.  Psychiatric:        Behavior: Behavior normal.     ED Results and Treatments Labs (all labs ordered are listed, but only abnormal results are displayed) Labs Reviewed  COMPREHENSIVE METABOLIC PANEL - Abnormal; Notable for the following components:      Result Value   Glucose, Bld 103 (*)    Calcium 8.7 (*)    Total Protein 6.3 (*)    Albumin 3.4 (*)    AST 13 (*)    All other components within normal limits  CBC - Abnormal; Notable for the following components:   Hemoglobin 11.0 (*)    HCT 35.0 (*)    All other components within normal limits   LIPASE, BLOOD  URINALYSIS, ROUTINE W REFLEX MICROSCOPIC  PREGNANCY, URINE  I-STAT BETA HCG BLOOD, ED ([facility], [facility], [facility] ONLY)                                                                                                                         EKG  EKG Interpretation  Date/Time:    Ventricular Rate:    PR Interval:    QRS Duration:   QT Interval:    QTC Calculation:   R Axis:     Text Interpretation:         Radiology No results found.  Pertinent labs & imaging results that were available during my care of the patient were reviewed by me and considered in my medical decision making (see MDM for details).  Medications Ordered in ED Medications  ketorolac (TORADOL) 15 MG/ML injection 15 mg (15 mg Intravenous Given 01/16/22 0542)  Procedures Procedures  (including critical care time)  Medical Decision Making / ED Course    Complexity of Problem:  Patient's presenting problem/concern, DDX, and MDM listed below: Lower abdominal pain Patient is in a female to female relationship.  Less likely pregnancy related process but will confirm.  Denies any vaginal discharge that would be concerning for PID We will assess for urinary tract infection. We will also assess for any intra-abdominal inflammatory/infectious process including appendicitis.  Past medical history of perforated appendicitis is incorrect.     Complexity of Data:    Laboratory Tests ordered listed below with my independent interpretation: CBC without leukocytosis.  Mild anemia No significant electrolyte derangements or renal sufficiency No evidence of bili obstruction or pancreatitis Urine pregnancy negative. UA without evidence of infection.   Imaging Studies ordered listed below with my independent interpretation: Felt to be unnecessary at this time     ED Course:     Assessment, Add'l Intervention, and Reassessment: Lower abdominal discomfort Work-up reassuring Pain completely resolved with Toradol Lower concern for serious intra-abdominal inflammatory/infectious process now that work-up is complete.  No need for imaging at this time Low suspicion for torsion.   Final Clinical Impression(s) / ED Diagnoses Final diagnoses:  Lower abdominal pain   The patient appears reasonably screened and/or stabilized for discharge and I doubt any other medical condition or other [facility] requiring further screening, evaluation, or treatment in the ED at this time prior to discharge. Safe for discharge with strict return precautions.  Disposition: Discharge  Condition: Good  I have discussed the results, Dx and Tx plan with the patient/family who expressed understanding and agree(s) with the plan. Discharge instructions discussed at length. The patient/family was given strict return precautions who verbalized understanding of the instructions. No further questions at time of discharge.    ED Discharge Orders     None        Follow Up: Primary care provider  Call  to schedule an appointment for close follow up           This chart was dictated using voice recognition software.  Despite best efforts to proofread,  errors can occur which can change the documentation meaning.    Fatima Blank, MD 01/16/22 [phone]

## 2022-02-02 ENCOUNTER — Emergency Department (HOSPITAL_COMMUNITY)
Admission: EM | Admit: 2022-02-02 | Discharge: 2022-02-02 | Disposition: A | Discharge status: Home / Self Care | Payer: Medicaid Other | Attending: Emergency Medicine | Admitting: Emergency Medicine

## 2022-02-02 ENCOUNTER — Other Ambulatory Visit: Payer: Self-pay

## 2022-02-02 ENCOUNTER — Encounter (HOSPITAL_COMMUNITY): Payer: Self-pay | Admitting: Emergency Medicine

## 2022-02-02 DIAGNOSIS — Z5321 Procedure and treatment not carried out due to patient leaving prior to being seen by health care provider: Secondary | ICD-10-CM | POA: Insufficient documentation

## 2022-02-02 DIAGNOSIS — R1032 Left lower quadrant pain: Secondary | ICD-10-CM | POA: Insufficient documentation

## 2022-02-02 DIAGNOSIS — R112 Nausea with vomiting, unspecified: Secondary | ICD-10-CM | POA: Insufficient documentation

## 2022-02-02 DIAGNOSIS — R197 Diarrhea, unspecified: Secondary | ICD-10-CM | POA: Insufficient documentation

## 2022-02-02 HISTORY — DX: Unspecified ovarian cyst, unspecified side: N83.209

## 2022-02-02 LAB — COMPREHENSIVE METABOLIC PANEL
ALT: 12 U/L (ref 0–44)
AST: 14 U/L — ABNORMAL LOW (ref 15–41)
Albumin: 3.7 g/dL (ref 3.5–5.0)
Alkaline Phosphatase: 64 U/L (ref 38–126)
Anion gap: 12 (ref 5–15)
BUN: 14 mg/dL (ref 6–20)
CO2: 21 mmol/L — ABNORMAL LOW (ref 22–32)
Calcium: 8.9 mg/dL (ref 8.9–10.3)
Chloride: 103 mmol/L (ref 98–111)
Creatinine, Ser: 0.96 mg/dL (ref 0.44–1.00)
GFR, Estimated: >60 mL/min (ref >60)
Glucose, Bld: 105 mg/dL — ABNORMAL HIGH (ref 70–99)
Potassium: 3.7 mmol/L (ref 3.5–5.1)
Sodium: 136 mmol/L (ref 135–145)
Total Bilirubin: 0.8 mg/dL (ref 0.3–1.2)
Total Protein: 7.1 g/dL (ref 6.5–8.1)

## 2022-02-02 LAB — LIPASE, BLOOD: Lipase: 27 U/L (ref 11–51)

## 2022-02-02 LAB — CBC
HCT: 38.8 % (ref 36.0–46.0)
Hemoglobin: 12.4 g/dL (ref 12.0–15.0)
MCH: 27.6 pg (ref 26.0–34.0)
MCHC: 32 g/dL (ref 30.0–36.0)
MCV: 86.2 fL (ref 80.0–100.0)
Platelets: 394 10*3/uL (ref 150–400)
RBC: 4.5 MIL/uL (ref 3.87–5.11)
RDW: 15.7 % — ABNORMAL HIGH (ref 11.5–15.5)
WBC: 9.5 10*3/uL (ref 4.0–10.5)
nRBC: 0 % (ref 0.0–0.2)

## 2022-02-02 LAB — I-STAT BETA HCG BLOOD, ED (MC, WL, AP ONLY): I-stat hCG, quantitative: <5 m[IU]/mL (ref <5)

## 2022-02-02 NOTE — ED Notes (Signed)
Patient left without being seen, called for triage x3

## 2022-02-02 NOTE — ED Notes (Signed)
Pt unable to get urine

## 2022-02-02 NOTE — ED Triage Notes (Signed)
Pt to triage via [facility] from home.  Reports LLQ pain, nausea, vomiting, and diarrhea x 3 days.  LMP Sunday.  Pt states it feels like previous ovarian cyst pain.

## 2022-04-26 ENCOUNTER — Emergency Department (HOSPITAL_COMMUNITY)
Admission: EM | Admit: 2022-04-26 | Discharge: 2022-04-27 | Discharge status: Against Medical Advice | Payer: Self-pay | Attending: Student | Admitting: Student

## 2022-04-26 ENCOUNTER — Emergency Department (HOSPITAL_COMMUNITY): Payer: Self-pay

## 2022-04-26 ENCOUNTER — Other Ambulatory Visit: Payer: Self-pay

## 2022-04-26 DIAGNOSIS — W268XXA Contact with other sharp object(s), not elsewhere classified, initial encounter: Secondary | ICD-10-CM | POA: Insufficient documentation

## 2022-04-26 DIAGNOSIS — S61412A Laceration without foreign body of left hand, initial encounter: Secondary | ICD-10-CM | POA: Insufficient documentation

## 2022-04-26 DIAGNOSIS — Z5321 Procedure and treatment not carried out due to patient leaving prior to being seen by health care provider: Secondary | ICD-10-CM | POA: Insufficient documentation

## 2022-04-26 NOTE — ED Provider Triage Note (Signed)
Emergency Medicine Provider Triage Evaluation Note  Gabriela Best , a 41 y.o. female  was evaluated in triage.  Pt complains of laceration to left ring finger happened at 9 PM was sweeping and the broom broke and it went through the distal aspect of her ring finger was a metal broom, no paresthesia or weakness in her finger, updated on tetanus shot not immunocompromise.  Review of Systems  Positive: Finger pain, laceration Negative: Paresthesias, weakness  Physical Exam  BP (!) 167/84   Pulse 78   Temp 98.6 F (37 C) (Oral)   Resp 18   SpO2 99%  Gen:   Awake, no distress   Resp:  Normal effort  MSK:   Moves extremities without difficulty  Other:    Medical Decision Making  Medically screening exam initiated at 11:47 PM.  Appropriate orders placed.  Ian Cavey was informed that the remainder of the evaluation will be completed by another provider, this initial triage assessment does not replace that evaluation, and the importance of remaining in the ED until their evaluation is complete.  Imaging has has been ordered will need further evaluation in the back.   Marcello Fennel, PA-C 04/26/22 2348

## 2022-04-26 NOTE — ED Triage Notes (Signed)
Pt says around 2100 that a metal broom broke and it went through the tip of the left hand third finger. Bleeding controlled. Tetanus <5 years.

## 2022-04-27 ENCOUNTER — Ambulatory Visit (HOSPITAL_COMMUNITY)
Admission: EM | Admit: 2022-04-27 | Discharge: 2022-04-27 | Disposition: A | Discharge status: Home / Self Care | Payer: Self-pay | Attending: Internal Medicine | Admitting: Internal Medicine

## 2022-04-27 ENCOUNTER — Encounter (HOSPITAL_COMMUNITY): Payer: Self-pay | Admitting: Emergency Medicine

## 2022-04-27 DIAGNOSIS — Z23 Encounter for immunization: Secondary | ICD-10-CM

## 2022-04-27 DIAGNOSIS — S61213A Laceration without foreign body of left middle finger without damage to nail, initial encounter: Secondary | ICD-10-CM

## 2022-04-27 MED ORDER — LIDOCAINE HCL (PF) 1 % IJ SOLN
INTRAMUSCULAR | Status: AC
  Filled 2022-04-27: qty 4

## 2022-04-27 MED ORDER — ACETAMINOPHEN 325 MG PO TABS
650.0000 mg | ORAL_TABLET | Freq: Once | ORAL | Status: AC
  Administered 2022-04-27: 650 mg via ORAL
  Filled 2022-04-27: qty 2

## 2022-04-27 MED ORDER — TETANUS-DIPHTH-ACELL PERTUSSIS 5-2.5-18.5 LF-MCG/0.5 IM SUSY
0.5000 mL | PREFILLED_SYRINGE | Freq: Once | INTRAMUSCULAR | Status: AC
  Administered 2022-04-27: 0.5 mL via INTRAMUSCULAR

## 2022-04-27 MED ORDER — TETANUS-DIPHTH-ACELL PERTUSSIS 5-2.5-18.5 LF-MCG/0.5 IM SUSY
PREFILLED_SYRINGE | INTRAMUSCULAR | Status: AC
  Filled 2022-04-27: qty 0.5

## 2022-04-27 NOTE — ED Triage Notes (Signed)
Pt cut left middle finger last night when metal broom handle broke. Went to ED last night and had xray done but left before seen by EDP.

## 2022-04-27 NOTE — ED Notes (Signed)
Patient left on own accord °

## 2022-04-27 NOTE — ED Provider Notes (Signed)
[facility]    CSN: [id number] Arrival date & time: 04/27/22  1630      History   Chief Complaint Chief Complaint  Patient presents with   Finger Injury    HPI Gabriela Best is a 41 y.o. female.   Patient presents urgent care for evaluation of laceration to the left long finger that happened last night while she was sweeping with a metal broom and a piece of the metal broke cutting the palmar aspect of her finger tip.  Laceration happened at approximately 9 PM last night.  Laceration bled significantly at first but bleeding stopped quickly with pressure.  She went to the emergency department last night where she had imaging done of her left middle finger but left without being seen due to the long wait.  Unknown date of last tetanus shot.  She has full sensation distally to the injury.  She has full range of motion of the left hand and has kept the wound covered overnight to prevent infection.     Past Medical History:  Diagnosis Date   Abnormal menstrual cycle    Migraine    Ovarian cyst     Patient Active Problem List   Diagnosis Date Noted   Acute perforated appendicitis 11/14/2020   Symptomatic cholelithiasis 11/13/2020    Past Surgical History:  Procedure Laterality Date   CHOLECYSTECTOMY N/A 11/13/2020   Procedure: LAPAROSCOPIC CHOLECYSTECTOMY WITH INTRAOPERATIVE CHOLANGIOGRAM;  Surgeon: Felicie Morn, MD;  Location: [facility];  Service: General;  Laterality: N/A;   KNEE SURGERY     KNEE SURGERY Left     OB History   No obstetric history on file.      Home Medications    Prior to Admission medications   Medication Sig Start Date End Date Taking? Authorizing Provider  HYDROcodone-acetaminophen (NORCO/VICODIN) 5-325 MG tablet Take 1 tablet by mouth every 12 (twelve) hours. 01/23/21   Couture, Cortni S, PA-C  hydrocortisone cream 1 % Apply 1 application topically 4 (four) times daily as needed for itching (eczema).    [provider]  ibuprofen (ADVIL) 800 MG tablet Take 1 tablet (800 mg total) by mouth every 8 (eight) hours as needed. 08/15/21   Drenda Freeze, MD  oxyCODONE (OXY IR/ROXICODONE) 5 MG immediate release tablet Take 1 tablet (5 mg total) by mouth every 6 (six) hours as needed for breakthrough pain. 11/14/20   Maczis, Barth Kirks, PA-C  traMADol (ULTRAM) 50 MG tablet Take 1 tablet (50 mg total) by mouth every 6 (six) hours as needed. 08/15/21   Drenda Freeze, MD    Family History No family history on file.  Social History Social History   Tobacco Use   Smoking status: Every Day    Packs/day: 0.15    Types: Cigarettes   Smokeless tobacco: Never  Vaping Use   Vaping Use: Never used  Substance Use Topics   Alcohol use: Yes    Comment: occ   Drug use: Never     Allergies   Patient has no known allergies.   Review of Systems Review of Systems Per HPI  Physical Exam Triage Vital Signs ED Triage Vitals  Enc Vitals Group     BP 04/27/22 1712 (!) 158/96     Pulse Rate 04/27/22 1712 72     Resp 04/27/22 1712 18     Temp 04/27/22 1712 98.6 F (37 C)     Temp Source 04/27/22 1712 Oral  SpO2 04/27/22 1712 100 %     Weight --      Height --      Head Circumference --      Peak Flow --      Pain Score 04/27/22 1711 10     Pain Loc --      Pain Edu? --      Excl. in [place]? --    No data found.  Updated Vital Signs BP (!) 158/96 (BP Location: Right Arm)   Pulse 72   Temp 98.6 F (37 C) (Oral)   Resp 18   LMP 04/21/2022   SpO2 100%   Visual Acuity Right Eye Distance:   Left Eye Distance:   Bilateral Distance:    Right Eye Near:   Left Eye Near:    Bilateral Near:     Physical Exam Vitals and nursing note reviewed.  Constitutional:      Appearance: She is not ill-appearing or toxic-appearing.  HENT:     Head: Normocephalic and atraumatic.     Right Ear: Hearing and external ear normal.     Left Ear: Hearing and external ear normal.     Nose: Nose normal.      Mouth/Throat:     Lips: Pink.  Eyes:     General: Lids are normal. Vision grossly intact. Gaze aligned appropriately.     Extraocular Movements: Extraocular movements intact.     Conjunctiva/sclera: Conjunctivae normal.  Pulmonary:     Effort: Pulmonary effort is normal.  Musculoskeletal:     Cervical back: Neck supple.  Skin:    General: Skin is warm and dry.     Capillary Refill: Capillary refill takes less than 2 seconds.     Findings: No rash.     Comments: Approximately 1 cm laceration present to the palmar aspect of the left middle finger tip.  See image below for further details.  +2 radial pulse to the left side.  Capillary refill to the affected digit is less than 3.  Nail is intact.  Sensation intact distal to injury.  Bleeding is controlled.  Neurological:     General: No focal deficit present.     Mental Status: She is alert and oriented to person, place, and time. Mental status is at baseline.     Cranial Nerves: No dysarthria or facial asymmetry.  Psychiatric:        Mood and Affect: Mood normal.        Speech: Speech normal.        Behavior: Behavior normal.        Thought Content: Thought content normal.        Judgment: Judgment normal.        UC Treatments / Results  Labs (all labs ordered are listed, but only abnormal results are displayed) Labs Reviewed - No data to display  EKG   Radiology DG Finger Middle Left  Result Date: 04/27/2022 CLINICAL DATA:  Left long finger laceration EXAM: LEFT MIDDLE FINGER 2+V COMPARISON:  None Available. FINDINGS: There is no evidence of fracture or dislocation. There is no evidence of arthropathy or other focal bone abnormality. Soft tissue defect noted within the distal aspect of the left long finger in keeping with given history of laceration. No retained radiopaque foreign body. IMPRESSION: Soft tissue laceration. No fracture or retained radiopaque foreign body. Electronically Signed   By: Fidela Salisbury M.D.   On:  04/27/2022 00:10    Procedures Laceration Repair  Date/Time: 04/27/2022  5:57 PM  Performed by: Talbot Grumbling, FNP Authorized by: Talbot Grumbling, FNP   Consent:    Consent obtained:  Verbal   Consent given by:  Patient   Risks, benefits, and alternatives were discussed: yes     Risks discussed:  Infection, need for additional repair, vascular damage, tendon damage, nerve damage, poor cosmetic result, pain and poor wound healing   Alternatives discussed:  No treatment Universal protocol:    Patient identity confirmed:  Verbally with patient Anesthesia:    Anesthesia method:  Local infiltration   Local anesthetic:  Lidocaine 1% w/o epi Laceration details:    Location:  Finger   Finger location:  L long finger   Length (cm):  1   Depth (mm):  5 Exploration:    Imaging obtained: x-ray     Imaging outcome: foreign body not noted   Treatment:    Area cleansed with:  Povidone-iodine and soap and water Skin repair:    Repair method:  Sutures   Suture size:  5-0   Suture material:  Prolene   Suture technique:  Simple interrupted   Number of sutures:  3 Approximation:    Approximation:  Close Repair type:    Repair type:  Simple Post-procedure details:    Dressing:  Non-adherent dressing   Procedure completion:  Tolerated well, no immediate complications  (including critical care time)  Medications Ordered in UC Medications  Tdap (BOOSTRIX) injection 0.5 mL (0.5 mLs Intramuscular Given 04/27/22 1730)    Initial Impression / Assessment and Plan / UC Course  I have reviewed the triage vital signs and the nursing notes.  Pertinent labs & imaging results that were available during my care of the patient were reviewed by me and considered in my medical decision making (see chart for details).   1.  Laceration of the left middle finger without foreign body without damage to the nail See laceration repair note above for further detail regarding procedure.  Patient  tolerated procedure well.  Wound cleansed and dressed in the clinic with nonstick gauze and Coban wrap.  Advised patient to avoid placing lotions, powders, or ointments to the wound as the wound heals.  She may change the dressing twice daily with nonstick gauze and Coban and monitor the site for signs of infection.  Advised to return in 10 days to have sutures removed.  She is not at increased risk for delayed healing as she is not a diabetic.  Patient is agreeable with this plan.  Tdap injection updated in clinic at today's encounter.   Discussed physical exam and available lab work findings in clinic with patient.  Counseled patient regarding appropriate use of medications and potential side effects for all medications recommended or prescribed today. Discussed red flag signs and symptoms of worsening condition,when to call the PCP office, return to urgent care, and when to seek higher level of care in the emergency department. Patient verbalizes understanding and agreement with plan. All questions answered. Patient discharged in stable condition.    Final Clinical Impressions(s) / UC Diagnoses   Final diagnoses:  Laceration of left middle finger without foreign body without damage to nail, initial encounter     Discharge Instructions      Wound care: Please keep the area surrounding the wound/sutures clean and dry for the next 24 hours. After 24 hours, you may get the wound wet. Gently clean wound with antibacterial soap. Do not scrub wound. Cover the area with a nonstick bandage  and change the bandage 2 times a day.   You should have the sutures removed in 10 days by your primary care provider or at urgent care. Return sooner than 10 days if you experience discharge from your laceration, redness around your laceration, warmth around your laceration, or fever.   We recommend you take '600mg'$  ibuprofen every 6 hours or tylenol '650mg'$  every 6 hours as needed for pain. If needed, you can alternate  these medications so that you take one medication every 3 hours. For instance, at noon take ibuprofen, then at 3pm take tylenol, then at 6pm take ibuprofen.      ED Prescriptions   None    PDMP not reviewed this encounter.   Talbot Grumbling, [place] 04/27/22 1759

## 2022-04-27 NOTE — Discharge Instructions (Signed)
Wound care: Please keep the area surrounding the wound/sutures clean and dry for the next 24 hours. After 24 hours, you may get the wound wet. Gently clean wound with antibacterial soap. Do not scrub wound. Cover the area with a nonstick bandage and change the bandage 2 times a day.   You should have the sutures removed in 10 days by your primary care provider or at urgent care. Return sooner than 10 days if you experience discharge from your laceration, redness around your laceration, warmth around your laceration, or fever.   We recommend you take 600mg ibuprofen every 6 hours or tylenol 650mg every 6 hours as needed for pain. If needed, you can alternate these medications so that you take one medication every 3 hours. For instance, at noon take ibuprofen, then at 3pm take tylenol, then at 6pm take ibuprofen.  

## 2022-08-04 ENCOUNTER — Encounter (HOSPITAL_COMMUNITY): Payer: Self-pay

## 2022-08-04 ENCOUNTER — Ambulatory Visit (HOSPITAL_COMMUNITY)
Admission: EM | Admit: 2022-08-04 | Discharge: 2022-08-04 | Disposition: A | Discharge status: Home / Self Care | Payer: Self-pay | Attending: Emergency Medicine | Admitting: Emergency Medicine

## 2022-08-04 DIAGNOSIS — B349 Viral infection, unspecified: Secondary | ICD-10-CM

## 2022-08-04 LAB — POCT URINALYSIS DIPSTICK, ED / UC
Glucose, UA: NEGATIVE mg/dL
Leukocytes,Ua: NEGATIVE
Nitrite: NEGATIVE
Protein, ur: 100 mg/dL — AB
Specific Gravity, Urine: >=1.03 (ref 1.005–1.030)
Urobilinogen, UA: 0.2 mg/dL (ref 0.0–1.0)
pH: 5.5 (ref 5.0–8.0)

## 2022-08-04 LAB — POC INFLUENZA A AND B ANTIGEN (URGENT CARE ONLY)
INFLUENZA A ANTIGEN, POC: NEGATIVE
INFLUENZA B ANTIGEN, POC: NEGATIVE

## 2022-08-04 MED ORDER — ONDANSETRON 4 MG PO TBDP
4.0000 mg | ORAL_TABLET | Freq: Once | ORAL | Status: AC
  Administered 2022-08-04: 4 mg via ORAL

## 2022-08-04 MED ORDER — ACETAMINOPHEN 325 MG PO TABS: 650.0000 mg | ORAL_TABLET | Freq: Four times a day (QID) | ORAL | 0 refills | Status: DC | PRN

## 2022-08-04 MED ORDER — CYCLOBENZAPRINE HCL 10 MG PO TABS: 10.0000 mg | ORAL_TABLET | Freq: Two times a day (BID) | ORAL | 0 refills | Status: DC | PRN

## 2022-08-04 MED ORDER — ACETAMINOPHEN 325 MG PO TABS
ORAL_TABLET | ORAL | Status: AC
  Filled 2022-08-04: qty 3

## 2022-08-04 MED ORDER — ONDANSETRON 4 MG PO TBDP
ORAL_TABLET | ORAL | Status: AC
  Filled 2022-08-04: qty 1

## 2022-08-04 MED ORDER — ACETAMINOPHEN 325 MG PO TABS
975.0000 mg | ORAL_TABLET | Freq: Once | ORAL | Status: AC
  Administered 2022-08-04: 975 mg via ORAL

## 2022-08-04 MED ORDER — ONDANSETRON 4 MG PO TBDP: 4.0000 mg | ORAL_TABLET | Freq: Four times a day (QID) | ORAL | 0 refills | Status: DC | PRN

## 2022-08-04 NOTE — Discharge Instructions (Addendum)
You can take the zofran (nausea medicine) every 6 hours to settle the stomach  Drink lots of fluids. This is the most important treatment.  Stick with bland diet if you have appetite.   I recommend tylenol 650 mg, every 4-6 hours for aches. You can take the muscle relaxer twice daily to help with body aches as well.

## 2022-08-04 NOTE — ED Provider Notes (Signed)
[place]    CSN: [id number] Arrival date & time: 08/04/22  1626     History   Chief Complaint Chief Complaint  Patient presents with   Emesis   Abdominal Pain   Headache    HPI Gabriela Best is a 42 y.o. female.  Presents with 3-day history of bodyaches, headache, couple episodes of vomiting.  Last emesis was this morning after trying to eat A little diarrhea She can tolerate fluids but has not been drinking much Reports nausea Body aches are 10/10 today Took ibuprofen once yesterday, no other medications No fevers No cough or congestion  Son sick with similar  Past Medical History:  Diagnosis Date   Abnormal menstrual cycle    Migraine    Ovarian cyst     Patient Active Problem List   Diagnosis Date Noted   Acute perforated appendicitis 11/14/2020   Symptomatic cholelithiasis 11/13/2020    Past Surgical History:  Procedure Laterality Date   CHOLECYSTECTOMY N/A 11/13/2020   Procedure: LAPAROSCOPIC CHOLECYSTECTOMY WITH INTRAOPERATIVE CHOLANGIOGRAM;  Surgeon: Felicie Morn, MD;  Location: [facility];  Service: General;  Laterality: N/A;   KNEE SURGERY     KNEE SURGERY Left     OB History   No obstetric history on file.      Home Medications    Prior to Admission medications   Medication Sig Start Date End Date Taking? Authorizing Provider  acetaminophen (TYLENOL) 325 MG tablet Take 2 tablets (650 mg total) by mouth every 6 (six) hours as needed. 08/04/22  Yes Abdulkarim Eberlin, Wells Guiles, PA-C  cyclobenzaprine (FLEXERIL) 10 MG tablet Take 1 tablet (10 mg total) by mouth 2 (two) times daily as needed. 08/04/22  Yes Zamiya Dillard, PA-C  ondansetron (ZOFRAN-ODT) 4 MG disintegrating tablet Take 1 tablet (4 mg total) by mouth every 6 (six) hours as needed. 08/04/22  Yes Chaz Mcglasson, Wells Guiles, PA-C  HYDROcodone-acetaminophen (NORCO/VICODIN) 5-325 MG tablet Take 1 tablet by mouth every 12 (twelve) hours. 01/23/21   Couture, Cortni S, PA-C  hydrocortisone cream 1 %  Apply 1 application topically 4 (four) times daily as needed for itching (eczema).    [provider]  ibuprofen (ADVIL) 800 MG tablet Take 1 tablet (800 mg total) by mouth every 8 (eight) hours as needed. 08/15/21   Drenda Freeze, MD  oxyCODONE (OXY IR/ROXICODONE) 5 MG immediate release tablet Take 1 tablet (5 mg total) by mouth every 6 (six) hours as needed for breakthrough pain. 11/14/20   Maczis, Barth Kirks, PA-C  traMADol (ULTRAM) 50 MG tablet Take 1 tablet (50 mg total) by mouth every 6 (six) hours as needed. 08/15/21   Drenda Freeze, MD    Family History History reviewed. No pertinent family history.  Social History Social History   Tobacco Use   Smoking status: Every Day    Packs/day: 0.15    Types: Cigarettes   Smokeless tobacco: Never  Vaping Use   Vaping Use: Never used  Substance Use Topics   Alcohol use: Yes    Comment: occ   Drug use: Never     Allergies   Patient has no known allergies.   Review of Systems Review of Systems  As per HPI  Physical Exam Triage Vital Signs ED Triage Vitals  Enc Vitals Group     BP 08/04/22 1740 104/64     Pulse Rate 08/04/22 1740 75     Resp 08/04/22 1740 16     Temp 08/04/22 1740 98.8 F (37.1 C)  Temp Source 08/04/22 1740 Oral     SpO2 08/04/22 1740 97 %     Weight 08/04/22 1739 275 lb (124.7 kg)     Height 08/04/22 1739 '5\' 2"'$  (1.575 m)     Head Circumference --      Peak Flow --      Pain Score 08/04/22 1738 10     Pain Loc --      Pain Edu? --      Excl. in [place]? --    No data found.  Updated Vital Signs BP 104/64 (BP Location: Right Arm)   Pulse 75   Temp 98.8 F (37.1 C) (Oral)   Resp 16   Ht '5\' 2"'$  (1.575 m)   Wt 275 lb (124.7 kg)   LMP 08/01/2022 (Exact Date)   SpO2 97%   BMI 50.30 kg/m    Physical Exam Vitals and nursing note reviewed.  HENT:     Right Ear: Tympanic membrane and ear canal normal.     Left Ear: Tympanic membrane and ear canal normal.     Nose: No congestion  or rhinorrhea.     Mouth/Throat:     Mouth: Mucous membranes are moist.     Pharynx: Oropharynx is clear. No posterior oropharyngeal erythema.  Eyes:     Conjunctiva/sclera: Conjunctivae normal.  Cardiovascular:     Rate and Rhythm: Normal rate and regular rhythm.     Pulses: Normal pulses.     Heart sounds: Normal heart sounds.  Pulmonary:     Effort: Pulmonary effort is normal.     Breath sounds: Normal breath sounds.  Abdominal:     Tenderness: There is no abdominal tenderness. There is no guarding.  Musculoskeletal:     Cervical back: Normal range of motion.  Lymphadenopathy:     Cervical: No cervical adenopathy.  Skin:    General: Skin is warm and dry.  Neurological:     Mental Status: She is alert and oriented to person, place, and time.     UC Treatments / Results  Labs (all labs ordered are listed, but only abnormal results are displayed) Labs Reviewed  POCT URINALYSIS DIPSTICK, ED / UC - Abnormal; Notable for the following components:      Result Value   Bilirubin Urine SMALL (*)    Ketones, ur TRACE (*)    Hgb urine dipstick LARGE (*)    Protein, ur 100 (*)    All other components within normal limits  POC INFLUENZA A AND B ANTIGEN (URGENT CARE ONLY)    EKG   Radiology No results found.  Procedures Procedures (including critical care time)  Medications Ordered in UC Medications  ondansetron (ZOFRAN-ODT) disintegrating tablet 4 mg (4 mg Oral Given 08/04/22 1816)  acetaminophen (TYLENOL) tablet 975 mg (975 mg Oral Given 08/04/22 1843)    Initial Impression / Assessment and Plan / UC Course  I have reviewed the triage vital signs and the nursing notes.  Pertinent labs & imaging results that were available during my care of the patient were reviewed by me and considered in my medical decision making (see chart for details).  Afebrile here UA with elevated specific gravity  Zofran ODT given for nausea, patient reports stomach feeling more settled. No  episodes of vomiting in clinic. Tylenol 975 mg given for aches  Discussed use of medication at home. Can continue Zofran every 6 hours as needed, increase fluids to at least 64 ounces daily.  Recommend using Tylenol every 4-6 hours  to control body aches.  I have also sent muscle relaxer to use twice daily.  Patient understands this medication can make her drowsy. Discussed bland diet as tolerated. Work note provided Return precautions discussed. Patient agrees to plan  Final Clinical Impressions(s) / UC Diagnoses   Final diagnoses:  Viral illness     Discharge Instructions      You can take the zofran (nausea medicine) every 6 hours to settle the stomach  Drink lots of fluids. This is the most important treatment.  Stick with bland diet if you have appetite.   I recommend tylenol 650 mg, every 4-6 hours for aches. You can take the muscle relaxer twice daily to help with body aches as well.    ED Prescriptions     Medication Sig Dispense Auth. Provider   ondansetron (ZOFRAN-ODT) 4 MG disintegrating tablet Take 1 tablet (4 mg total) by mouth every 6 (six) hours as needed. 20 tablet Zeeva Courser, PA-C   cyclobenzaprine (FLEXERIL) 10 MG tablet Take 1 tablet (10 mg total) by mouth 2 (two) times daily as needed. 20 tablet Merrit Waugh, PA-C   acetaminophen (TYLENOL) 325 MG tablet Take 2 tablets (650 mg total) by mouth every 6 (six) hours as needed. 30 tablet Sophie Quiles, Wells Guiles, PA-C      PDMP not reviewed this encounter.   Kyra Leyland 08/04/22 1901

## 2022-08-04 NOTE — ED Triage Notes (Signed)
Chief Complaint: headache, abdominal pain, emesis, low back pain, body aches, diarrhea (yesterday).   Onset: 2 days   Prescriptions or OTC medications tried: Yes- tylenol PM    with no relief  Sick exposure: Yes- step son being seen today   New foods, medications, or products: No  Recent Travel: No

## 2022-12-21 ENCOUNTER — Other Ambulatory Visit: Payer: Self-pay | Admitting: *Deleted

## 2022-12-21 ENCOUNTER — Other Ambulatory Visit: Payer: Self-pay

## 2022-12-21 DIAGNOSIS — Z3009 Encounter for other general counseling and advice on contraception: Secondary | ICD-10-CM | POA: Diagnosis not present

## 2022-12-21 DIAGNOSIS — I159 Secondary hypertension, unspecified: Secondary | ICD-10-CM | POA: Diagnosis not present

## 2022-12-21 DIAGNOSIS — Z1231 Encounter for screening mammogram for malignant neoplasm of breast: Secondary | ICD-10-CM

## 2022-12-21 DIAGNOSIS — Z7252 High risk homosexual behavior: Secondary | ICD-10-CM | POA: Diagnosis not present

## 2022-12-21 DIAGNOSIS — Z114 Encounter for screening for human immunodeficiency virus [HIV]: Secondary | ICD-10-CM | POA: Diagnosis not present

## 2022-12-24 ENCOUNTER — Ambulatory Visit
Admission: RE | Admit: 2022-12-24 | Discharge: 2022-12-24 | Disposition: A | Discharge status: Home / Self Care | Payer: Medicaid Other | Source: Ambulatory Visit

## 2022-12-24 DIAGNOSIS — Z1231 Encounter for screening mammogram for malignant neoplasm of breast: Secondary | ICD-10-CM | POA: Diagnosis not present

## 2022-12-25 ENCOUNTER — Emergency Department (HOSPITAL_COMMUNITY)
Admission: EM | Admit: 2022-12-25 | Discharge: 2022-12-25 | Disposition: A | Discharge status: Home / Self Care | Payer: Medicaid Other | Attending: Emergency Medicine | Admitting: Emergency Medicine

## 2022-12-25 ENCOUNTER — Encounter (HOSPITAL_COMMUNITY): Payer: Self-pay

## 2022-12-25 ENCOUNTER — Emergency Department (HOSPITAL_COMMUNITY): Payer: Medicaid Other

## 2022-12-25 DIAGNOSIS — D259 Leiomyoma of uterus, unspecified: Secondary | ICD-10-CM | POA: Diagnosis not present

## 2022-12-25 DIAGNOSIS — R102 Pelvic and perineal pain: Secondary | ICD-10-CM

## 2022-12-25 DIAGNOSIS — R1030 Lower abdominal pain, unspecified: Secondary | ICD-10-CM | POA: Diagnosis present

## 2022-12-25 DIAGNOSIS — R1084 Generalized abdominal pain: Secondary | ICD-10-CM | POA: Diagnosis not present

## 2022-12-25 LAB — COMPREHENSIVE METABOLIC PANEL
ALT: 12 U/L (ref 0–44)
AST: 13 U/L — ABNORMAL LOW (ref 15–41)
Albumin: 3.3 g/dL — ABNORMAL LOW (ref 3.5–5.0)
Alkaline Phosphatase: 59 U/L (ref 38–126)
Anion gap: 9 (ref 5–15)
BUN: 10 mg/dL (ref 6–20)
CO2: 25 mmol/L (ref 22–32)
Calcium: 8.4 mg/dL — ABNORMAL LOW (ref 8.9–10.3)
Chloride: 105 mmol/L (ref 98–111)
Creatinine, Ser: 0.96 mg/dL (ref 0.44–1.00)
GFR, Estimated: >60 mL/min (ref >60)
Glucose, Bld: 106 mg/dL — ABNORMAL HIGH (ref 70–99)
Potassium: 3.9 mmol/L (ref 3.5–5.1)
Sodium: 139 mmol/L (ref 135–145)
Total Bilirubin: 0.3 mg/dL (ref 0.3–1.2)
Total Protein: 6.3 g/dL — ABNORMAL LOW (ref 6.5–8.1)

## 2022-12-25 LAB — URINALYSIS, ROUTINE W REFLEX MICROSCOPIC
Bilirubin Urine: NEGATIVE
Glucose, UA: NEGATIVE mg/dL
Ketones, ur: NEGATIVE mg/dL
Leukocytes,Ua: NEGATIVE
Nitrite: NEGATIVE
Protein, ur: NEGATIVE mg/dL
Specific Gravity, Urine: 1.021 (ref 1.005–1.030)
pH: 5 (ref 5.0–8.0)

## 2022-12-25 LAB — CBC
HCT: 33.8 % — ABNORMAL LOW (ref 36.0–46.0)
Hemoglobin: 10.7 g/dL — ABNORMAL LOW (ref 12.0–15.0)
MCH: 27 pg (ref 26.0–34.0)
MCHC: 31.7 g/dL (ref 30.0–36.0)
MCV: 85.4 fL (ref 80.0–100.0)
Platelets: 406 10*3/uL — ABNORMAL HIGH (ref 150–400)
RBC: 3.96 MIL/uL (ref 3.87–5.11)
RDW: 15.8 % — ABNORMAL HIGH (ref 11.5–15.5)
WBC: 8.4 10*3/uL (ref 4.0–10.5)
nRBC: 0 % (ref 0.0–0.2)

## 2022-12-25 LAB — I-STAT BETA HCG BLOOD, ED (MC, WL, AP ONLY): I-stat hCG, quantitative: <5 m[IU]/mL (ref <5)

## 2022-12-25 LAB — LIPASE, BLOOD: Lipase: 31 U/L (ref 11–51)

## 2022-12-25 MED ORDER — HYOSCYAMINE SULFATE 0.125 MG SL SUBL: 0.1250 mg | SUBLINGUAL_TABLET | SUBLINGUAL | 1 refills | Status: DC | PRN

## 2022-12-25 MED ORDER — NAPROXEN 375 MG PO TABS: 375.0000 mg | ORAL_TABLET | Freq: Two times a day (BID) | ORAL | 0 refills | Status: DC

## 2022-12-25 MED ORDER — HYOSCYAMINE SULFATE 0.125 MG SL SUBL
0.1250 mg | SUBLINGUAL_TABLET | Freq: Once | SUBLINGUAL | Status: AC
  Administered 2022-12-25: 0.125 mg via SUBLINGUAL
  Filled 2022-12-25: qty 1

## 2022-12-25 MED ORDER — ONDANSETRON HCL 4 MG/2ML IJ SOLN
4.0000 mg | Freq: Once | INTRAMUSCULAR | Status: AC
  Administered 2022-12-25: 4 mg via INTRAVENOUS
  Filled 2022-12-25: qty 2

## 2022-12-25 MED ORDER — FENTANYL CITRATE PF 50 MCG/ML IJ SOSY
50.0000 ug | PREFILLED_SYRINGE | Freq: Once | INTRAMUSCULAR | Status: AC
  Administered 2022-12-25: 50 ug via INTRAVENOUS
  Filled 2022-12-25: qty 1

## 2022-12-25 MED ORDER — HYOSCYAMINE SULFATE 0.125 MG PO TABS: 0.1250 mg | ORAL_TABLET | Freq: Once | ORAL | Status: DC

## 2022-12-25 MED ORDER — HYOSCYAMINE SULFATE 0.125 MG PO TBDP: 0.1250 mg | ORAL_TABLET | Freq: Once | ORAL | Status: DC

## 2022-12-25 MED ORDER — SODIUM CHLORIDE 0.9 % IV BOLUS
1000.0000 mL | Freq: Once | INTRAVENOUS | Status: AC
  Administered 2022-12-25: 1000 mL via INTRAVENOUS

## 2022-12-25 MED ORDER — ONDANSETRON HCL 4 MG PO TABS: 4.0000 mg | ORAL_TABLET | Freq: Three times a day (TID) | ORAL | 1 refills | Status: DC | PRN

## 2022-12-25 MED ORDER — IOHEXOL 350 MG/ML SOLN
75.0000 mL | Freq: Once | INTRAVENOUS | Status: AC | PRN
  Administered 2022-12-25: 75 mL via INTRAVENOUS

## 2022-12-25 NOTE — Discharge Instructions (Signed)
Contact a health care provider if you: Have pelvic pain, back pain, or cramps in your abdomen that do not get better with medicine or heat. Develop new bleeding between menstrual periods. Have increased bleeding during or between menstrual periods. Feel more tired or weak than usual. Feel light-headed. Get help right away if you: Faint. Have pelvic pain that suddenly gets worse. Have severe vaginal bleeding that soaks a tampon or pad in 30 minutes or less.

## 2022-12-25 NOTE — ED Provider Notes (Signed)
[place] EMERGENCY DEPARTMENT AT [facility] Provider Note   CSN: [id number] Arrival date & time: 12/25/22  1502     History  Chief Complaint  Patient presents with   Abdominal Pain    Gabriela Best is a 42 y.o. female who presents with acute on chronic abdominal pain. Patient and her wife give the history.  I also obtained history from review of EMR. Patient reports frequent episodes of recurrent abdominal pain sometimes associated with her period but occurs not during her period as well.  She currently has pain in her lower abdomen and her epigastrium.  She describes the pain as sharp, no aggravating or alleviating symptoms, nothing makes it better or worse.  She states that when it happens she has associated vomiting and nausea and is in the bed for several days.  She is status postcholecystectomy and stat up status post appendectomy after acute rupture of her appendix.  She also has been having foul odor and discharge from her umbilicus which she has noted since she had her cholecystectomy.  She denies any fevers or chills.  She denies diarrhea or constipation.   Abdominal Pain      Home Medications Prior to Admission medications   Medication Sig Start Date End Date Taking? Authorizing Provider  acetaminophen (TYLENOL) 325 MG tablet Take 2 tablets (650 mg total) by mouth every 6 (six) hours as needed. 08/04/22   Rising, Lurena Joiner, PA-C  cyclobenzaprine (FLEXERIL) 10 MG tablet Take 1 tablet (10 mg total) by mouth 2 (two) times daily as needed. 08/04/22   Rising, Lurena Joiner, PA-C  HYDROcodone-acetaminophen (NORCO/VICODIN) 5-325 MG tablet Take 1 tablet by mouth every 12 (twelve) hours. 01/23/21   Couture, Cortni S, PA-C  hydrocortisone cream 1 % Apply 1 application topically 4 (four) times daily as needed for itching (eczema).    [provider]  ibuprofen (ADVIL) 800 MG tablet Take 1 tablet (800 mg total) by mouth every 8 (eight) hours as needed. 08/15/21   Charlynne Pander, MD  ondansetron (ZOFRAN-ODT) 4 MG disintegrating tablet Take 1 tablet (4 mg total) by mouth every 6 (six) hours as needed. 08/04/22   Rising, Lurena Joiner, PA-C  oxyCODONE (OXY IR/ROXICODONE) 5 MG immediate release tablet Take 1 tablet (5 mg total) by mouth every 6 (six) hours as needed for breakthrough pain. 11/14/20   Maczis, Elmer Sow, PA-C  traMADol (ULTRAM) 50 MG tablet Take 1 tablet (50 mg total) by mouth every 6 (six) hours as needed. 08/15/21   Charlynne Pander, MD      Allergies    Patient has no known allergies.    Review of Systems   Review of Systems  Gastrointestinal:  Positive for abdominal pain.    Physical Exam Updated Vital Signs BP (!) 135/97 (BP Location: Right Arm)   Pulse 88   Temp 98.8 F (37.1 C)   Resp 16   Ht 5\' 2"  (1.575 m)   Wt 127 kg   SpO2 100%   BMI 51.21 kg/m  Physical Exam Vitals and nursing note reviewed.  Constitutional:      General: She is not in acute distress.    Appearance: She is well-developed. She is not diaphoretic.  HENT:     Head: Normocephalic and atraumatic.     Right Ear: External ear normal.     Left Ear: External ear normal.     Nose: Nose normal.     Mouth/Throat:     Mouth: Mucous membranes are moist.  Eyes:     General: No scleral icterus.    Conjunctiva/sclera: Conjunctivae normal.  Cardiovascular:     Rate and Rhythm: Normal rate and regular rhythm.     Heart sounds: Normal heart sounds. No murmur heard.    No friction rub. No gallop.  Pulmonary:     Effort: Pulmonary effort is normal. No respiratory distress.     Breath sounds: Normal breath sounds.  Abdominal:     General: Bowel sounds are normal. There is no distension.     Palpations: Abdomen is soft. There is no mass.     Tenderness: There is no abdominal tenderness. There is no guarding.     Comments: Diffuse tenderness in the lower abdomen, tenderness over the epigastrium.  Tenderness in the umbilicus.  There is some mild erythema and cheesy white  discharge noted in the umbilicus.  No active liquid discharge at this time.  Musculoskeletal:     Cervical back: Normal range of motion.  Skin:    General: Skin is warm and dry.  Neurological:     Mental Status: She is alert and oriented to person, place, and time.  Psychiatric:        Behavior: Behavior normal.    ED Results / Procedures / Treatments   Labs (all labs ordered are listed, but only abnormal results are displayed) Labs Reviewed  COMPREHENSIVE METABOLIC PANEL - Abnormal; Notable for the following components:      Result Value   Glucose, Bld 106 (*)    Calcium 8.4 (*)    Total Protein 6.3 (*)    Albumin 3.3 (*)    AST 13 (*)    All other components within normal limits  CBC - Abnormal; Notable for the following components:   Hemoglobin 10.7 (*)    HCT 33.8 (*)    RDW 15.8 (*)    Platelets 406 (*)    All other components within normal limits  LIPASE, BLOOD  URINALYSIS, ROUTINE W REFLEX MICROSCOPIC  I-STAT BETA HCG BLOOD, ED ([facility], [facility], [facility] ONLY)    EKG None  Radiology No results found.  Procedures Procedures    Medications Ordered in ED Medications  sodium chloride 0.9 % bolus 1,000 mL (has no administration in time range)  ondansetron (ZOFRAN) injection 4 mg (has no administration in time range)  hyoscyamine (LEVSIN SL) SL tablet 0.125 mg (has no administration in time range)  fentaNYL (SUBLIMAZE) injection 50 mcg (has no administration in time range)    ED Course/ Medical Decision Making/ A&P                             Medical Decision Making Amount and/or Complexity of Data Reviewed Labs: ordered. Radiology: ordered.  Risk Prescription drug management.   This patient presents to the ED with chief complaint(s) of abd pain with pertinent past medical history of abdominal surgery, fibroid uterus which further complicates the presenting complaint. The complaint involves an extensive differential diagnosis and treatment options and also carries  with it a high risk of complications and morbidity.    The differential diagnosis includes The differential diagnosis for generalized abdominal pain includes, but is not limited to AAA, gastroenteritis, appendicitis, Bowel obstruction, Bowel perforation. Gastroparesis, DKA, Hernia, Inflammatory bowel disease, mesenteric ischemia, pancreatitis, peritonitis SBP, volvulus.    The initial plan is to order labs and imaging and to treat patient with pain medication and antiemetics for abdominal pain and vomiting  Additional history obtained: Additional history obtained from EMR and family at bedside Records reviewed   Reassessment and review (also see workup area): Lab Tests: I Ordered, and personally interpreted labs.  The pertinent results include:   No acute findings Imaging Studies: I ordered and independently visualized and interpreted the following imaging CT scan abdomen and pelvis   which showed no acute findings The interpretation of the imaging was limited to assessing for emergent pathology, for which purpose it was ordered.  Consultations Obtained:     Reevaluation of the patient after these medicines showed that the patient    improved  Social Determinants of Health: SDOH Screenings   Tobacco Use: High Risk (12/25/2022)     Cardiac Monitoring: The patient was maintained on a cardiac monitor.  I personally viewed and interpreted the cardiac monitor which showed an underlying rhythm of:  sinus rhythm  Complexity of problems addressed: Patient's presentation is most consistent with  acute, uncomplicated illness During patient's assessment  Disposition: After consideration of the diagnostic results and the patient's response to treatment,  I feel that the patent would benefit from discharge with GYN and GI follow-up. .  Patient here with acute on chronic abdominal pain.  She has an obvious large fibroid which may be the cause of her chronic pelvic pain.  I will have her  follow-up with GYN urine.  Will also have her follow-up with GI for her chronic nausea and epigastric pain.  No evidence of acute cholecystitis.  She may have peptic ulcer disease.  Will start her on a acid reducing medication.  She appears otherwise appropriate for discharge        Final Clinical Impression(s) / ED Diagnoses Final diagnoses:  None    Rx / DC Orders ED Discharge Orders     None         Arthor Captain, PA-C 12/25/22 2344    Loetta Rough, MD 12/26/22 [phone]

## 2022-12-25 NOTE — ED Notes (Signed)
Pt in bed talking on the phone, pt states that her pain is starting to come back.  Pt requests more pain med, pt awaits ct

## 2022-12-25 NOTE — ED Notes (Signed)
Pt in bed, pt reports no pain after pain med admin,

## 2022-12-25 NOTE — ED Triage Notes (Signed)
Pt c/o generalized sharp abd pain x couple of days; endorses N/V, denies fevers; LMP past Monday; denies urinary issues

## 2022-12-28 ENCOUNTER — Other Ambulatory Visit: Payer: Self-pay | Admitting: *Deleted

## 2022-12-28 ENCOUNTER — Other Ambulatory Visit: Payer: Self-pay

## 2022-12-28 ENCOUNTER — Telehealth: Payer: Self-pay

## 2022-12-28 DIAGNOSIS — R928 Other abnormal and inconclusive findings on diagnostic imaging of breast: Secondary | ICD-10-CM

## 2022-12-28 NOTE — Transitions of Care (Post Inpatient/ED Visit) (Signed)
12/28/2022  Name: Gabriela Best MRN: [id number] DOB: [birth year]  Today's TOC FU Call Status: Today's TOC FU Call Status:: Successful TOC FU Call Competed TOC FU Call Complete Date: 12/28/22   Red on EMMI-ED Discharge Alert Date & Reason:12/27/22 "Scheduled follow-up appt? No"  Transition Care Management Follow-up Telephone Call Date of Discharge: 12/25/22 Discharge Facility: [facility]) Type of Discharge: Emergency Department Reason for ED Visit: Other: ("generalized abd pain") How have you been since you were released from the hospital?: Better (Pt voices she is "feeling a little better." Still has some intermittent abd pain -comesand goes. She is eating fairly decent. She did have small amt of n&v yest but none today.States she had ultrasound done yest at MD office and awaiting results.) Any questions or concerns?: No  Items Reviewed: Did you receive and understand the discharge instructions provided?: Yes Medications obtained,verified, and reconciled?: No Medications Not Reviewed Reasons:: Other: (Pt reports she has not picked up meds prescribed at ED discharge yet-plans to go get them soon-states she is not taking any meds at present) Any new allergies since your discharge?: No Dietary orders reviewed?: Yes Type of Diet Ordered:: regular as tolerated Do you have support at home?: Yes People in Home: significant other Name of Support/Comfort Primary Source: Gabriela Best  Medications Reviewed Today: Medications Reviewed Today     Reviewed by Tilford Pillar, CMA (Certified Medical Assistant) on 08/04/22 at 1739  Med List Status: <None>   Medication Order Taking? Sig Documenting Provider Last Dose Status Informant  HYDROcodone-acetaminophen (NORCO/VICODIN) 5-325 MG tablet [id number]  Take 1 tablet by mouth every 12 (twelve) hours. Couture, Cortni S, PA-C  Active   hydrocortisone cream 1 % [id number]  Apply 1 application topically 4 (four) times daily as needed for itching  (eczema). [provider]  Active Self  ibuprofen (ADVIL) 800 MG tablet [id number]  Take 1 tablet (800 mg total) by mouth every 8 (eight) hours as needed. Charlynne Pander, MD  Active   oxyCODONE (OXY IR/ROXICODONE) 5 MG immediate release tablet [id number]  Take 1 tablet (5 mg total) by mouth every 6 (six) hours as needed for breakthrough pain. Maczis, Elmer Sow, PA-C  Active   traMADol (ULTRAM) 50 MG tablet [id number]  Take 1 tablet (50 mg total) by mouth every 6 (six) hours as needed. Charlynne Pander, MD  Active   Med List Note Art Buff, CPhT 11/13/20 0206): No meds per pt            Home Care and Equipment/Supplies: Were Home Health Services Ordered?: NA Any new equipment or medical supplies ordered?: NA  Functional Questionnaire: Do you need assistance with bathing/showering or dressing?: No Do you need assistance with meal preparation?: No Do you need assistance with eating?: No Do you have difficulty maintaining continence: No Do you need assistance with getting out of bed/getting out of a chair/moving?: No Do you have difficulty managing or taking your medications?: No  Follow up appointments reviewed: PCP Follow-up appointment confirmed?: Yes MD Provider Line Number:[phone] Given: Yes Date of PCP follow-up appointment?: 12/27/22 Follow-up Provider: Pt unable to recall name of provider-states she goes to "clinic on [address]" and completed visit yest Specialist Hospital Follow-up appointment confirmed?: No Reason Specialist Follow-Up Not Confirmed: Patient has Specialist Provider Number and will Call for Appointment (pt will call and follow up with GI MD and OB/GYN per d/c instructions) Do you need transportation to your follow-up appointment?: No Do you understand care options if your condition(s) worsen?:  Yes-patient verbalized understanding  SDOH Interventions Today    Flowsheet Row Most Recent Value  SDOH Interventions   Food Insecurity  Interventions Intervention Not Indicated  Transportation Interventions Intervention Not Indicated      TOC Interventions Today    Flowsheet Row Most Recent Value  TOC Interventions   TOC Interventions Discussed/Reviewed TOC Interventions Discussed      Interventions Today    Flowsheet Row Most Recent Value  General Interventions   General Interventions Discussed/Reviewed General Interventions Discussed, Doctor Visits  Doctor Visits Discussed/Reviewed Doctor Visits Discussed, PCP, Specialist  PCP/Specialist Visits Compliance with follow-up visit  Education Interventions   Education Provided Provided Education  Provided Verbal Education On Nutrition, When to see the doctor, Medication, Other  [sx mgmt]  Nutrition Interventions   Nutrition Discussed/Reviewed Nutrition Discussed  Pharmacy Interventions   Pharmacy Dicussed/Reviewed Pharmacy Topics Discussed, Medications and their functions      Alessandra Grout [facility] Care Management Care Management Community Coordinator Direct Phone: [phone] Toll Free: [phone] Fax: [phone]

## 2023-01-04 ENCOUNTER — Other Ambulatory Visit: Payer: Self-pay

## 2023-01-04 ENCOUNTER — Ambulatory Visit
Admission: RE | Admit: 2023-01-04 | Discharge: 2023-01-04 | Disposition: A | Discharge status: Home / Self Care | Payer: 59 | Source: Ambulatory Visit

## 2023-01-04 DIAGNOSIS — R590 Localized enlarged lymph nodes: Secondary | ICD-10-CM | POA: Diagnosis not present

## 2023-01-04 DIAGNOSIS — R928 Other abnormal and inconclusive findings on diagnostic imaging of breast: Secondary | ICD-10-CM | POA: Diagnosis not present

## 2023-01-04 DIAGNOSIS — R922 Inconclusive mammogram: Secondary | ICD-10-CM | POA: Diagnosis not present

## 2023-01-04 DIAGNOSIS — N6489 Other specified disorders of breast: Secondary | ICD-10-CM | POA: Diagnosis not present

## 2023-01-05 ENCOUNTER — Ambulatory Visit (HOSPITAL_COMMUNITY)
Admission: EM | Admit: 2023-01-05 | Discharge: 2023-01-05 | Disposition: A | Discharge status: Home / Self Care | Payer: 59 | Attending: Emergency Medicine | Admitting: Emergency Medicine

## 2023-01-05 ENCOUNTER — Other Ambulatory Visit: Payer: Self-pay

## 2023-01-05 ENCOUNTER — Ambulatory Visit (INDEPENDENT_AMBULATORY_CARE_PROVIDER_SITE_OTHER): Payer: 59

## 2023-01-05 ENCOUNTER — Encounter (HOSPITAL_COMMUNITY): Payer: Self-pay | Admitting: Emergency Medicine

## 2023-01-05 DIAGNOSIS — S60222A Contusion of left hand, initial encounter: Secondary | ICD-10-CM

## 2023-01-05 DIAGNOSIS — M7989 Other specified soft tissue disorders: Secondary | ICD-10-CM | POA: Diagnosis not present

## 2023-01-05 MED ORDER — KETOROLAC TROMETHAMINE 30 MG/ML IJ SOLN
30.0000 mg | Freq: Once | INTRAMUSCULAR | Status: AC
  Administered 2023-01-05: 30 mg via INTRAMUSCULAR

## 2023-01-05 MED ORDER — KETOROLAC TROMETHAMINE 30 MG/ML IJ SOLN
INTRAMUSCULAR | Status: AC
  Filled 2023-01-05: qty 1

## 2023-01-05 MED ORDER — IBUPROFEN 800 MG PO TABS: 800.0000 mg | ORAL_TABLET | Freq: Three times a day (TID) | ORAL | 0 refills | Status: DC

## 2023-01-05 NOTE — ED Triage Notes (Signed)
Pt states she woke up today with pain and swollen on left hand, unknown injury.

## 2023-01-05 NOTE — ED Provider Notes (Signed)
[facility]    CSN: [id number] Arrival date & time: 01/05/23  1322      History   Chief Complaint Chief Complaint  Patient presents with   Hand Pain    Pt states she woke up today with pain and swollen on left hand, unknown injury.    HPI Gabriela Best is a 42 y.o. female.   Patient presents to clinic with left hand pain and swelling.  Reports she injured her hand last night when she was "acting wild.''  Is unsure if she hit something, or someone, does not remember a specific injury.  Her left hand was hurting so bad that it was hard for her to get to sleep.  She has been icing the area, it remains swollen today. Pain with pointer finger movement.   Denies any falls. No open cuts or wounds.      The history is provided by the patient and medical records.  Hand Pain    Past Medical History:  Diagnosis Date   Abnormal menstrual cycle    Migraine    Ovarian cyst     Patient Active Problem List   Diagnosis Date Noted   Acute perforated appendicitis 11/14/2020   Symptomatic cholelithiasis 11/13/2020    Past Surgical History:  Procedure Laterality Date   CHOLECYSTECTOMY N/A 11/13/2020   Procedure: LAPAROSCOPIC CHOLECYSTECTOMY WITH INTRAOPERATIVE CHOLANGIOGRAM;  Surgeon: Quentin Ore, MD;  Location: [facility];  Service: General;  Laterality: N/A;   KNEE SURGERY     KNEE SURGERY Left     OB History   No obstetric history on file.      Home Medications    Prior to Admission medications   Medication Sig Start Date End Date Taking? Authorizing Provider  ibuprofen (ADVIL) 800 MG tablet Take 1 tablet (800 mg total) by mouth 3 (three) times daily. 01/05/23  Yes Rinaldo Ratel, [place] N, FNP  acetaminophen (TYLENOL) 325 MG tablet Take 2 tablets (650 mg total) by mouth every 6 (six) hours as needed. 08/04/22   Rising, Lurena Joiner, PA-C  cyclobenzaprine (FLEXERIL) 10 MG tablet Take 1 tablet (10 mg total) by mouth 2 (two) times daily as needed. 08/04/22   Rising,  Lurena Joiner, PA-C  HYDROcodone-acetaminophen (NORCO/VICODIN) 5-325 MG tablet Take 1 tablet by mouth every 12 (twelve) hours. 01/23/21   Couture, Cortni S, PA-C  hydrocortisone cream 1 % Apply 1 application topically 4 (four) times daily as needed for itching (eczema).    [provider]  hyoscyamine (LEVSIN/SL) 0.125 MG SL tablet Place 1 tablet (0.125 mg total) under the tongue every 4 (four) hours as needed (pain). Up to 1.25 mg daily 12/25/22   Arthor Captain, PA-C  naproxen (NAPROSYN) 375 MG tablet Take 1 tablet (375 mg total) by mouth 2 (two) times daily with a meal. 12/25/22   Harris, Abigail, PA-C  ondansetron (ZOFRAN) 4 MG tablet Take 1 tablet (4 mg total) by mouth every 8 (eight) hours as needed for nausea or vomiting. 12/25/22   Harris, Abigail, PA-C  ondansetron (ZOFRAN-ODT) 4 MG disintegrating tablet Take 1 tablet (4 mg total) by mouth every 6 (six) hours as needed. 08/04/22   Rising, Lurena Joiner, PA-C  oxyCODONE (OXY IR/ROXICODONE) 5 MG immediate release tablet Take 1 tablet (5 mg total) by mouth every 6 (six) hours as needed for breakthrough pain. 11/14/20   Maczis, Elmer Sow, PA-C  traMADol (ULTRAM) 50 MG tablet Take 1 tablet (50 mg total) by mouth every 6 (six) hours as needed. 08/15/21   Silverio Lay,  Gonzella Lex, MD    Family History Family History  Problem Relation Age of Onset   Breast cancer Maternal Grandmother     Social History Social History   Tobacco Use   Smoking status: Every Day    Packs/day: .15    Types: Cigarettes   Smokeless tobacco: Never  Vaping Use   Vaping Use: Never used  Substance Use Topics   Alcohol use: Yes    Comment: occ   Drug use: Never     Allergies   Patient has no known allergies.   Review of Systems Review of Systems  Musculoskeletal:  Positive for joint swelling.     Physical Exam Triage Vital Signs ED Triage Vitals  Enc Vitals Group     BP 01/05/23 1341 (!) 150/85     Pulse Rate 01/05/23 1341 69     Resp 01/05/23 1341 19     Temp  01/05/23 1341 98 F (36.7 C)     Temp Source 01/05/23 1341 Oral     SpO2 01/05/23 1341 100 %     Weight 01/05/23 1342 282 lb 3 oz (128 kg)     Height 01/05/23 1342 5\' 2"  (1.575 m)     Head Circumference --      Peak Flow --      Pain Score 01/05/23 1341 10     Pain Loc --      Pain Edu? --      Excl. in GC? --    No data found.  Updated Vital Signs BP (!) 150/85 (BP Location: Right Arm)   Pulse 69   Temp 98 F (36.7 C) (Oral)   Resp 19   Ht 5\' 2"  (1.575 m)   Wt 282 lb 3 oz (128 kg)   SpO2 100%   BMI 51.61 kg/m   Visual Acuity Right Eye Distance:   Left Eye Distance:   Bilateral Distance:    Right Eye Near:   Left Eye Near:    Bilateral Near:     Physical Exam Vitals and nursing note reviewed.  Constitutional:      Appearance: Normal appearance.  HENT:     Head: Normocephalic and atraumatic.     Right Ear: External ear normal.     Left Ear: External ear normal.     Nose: Nose normal.     Mouth/Throat:     Mouth: Mucous membranes are moist.  Eyes:     Conjunctiva/sclera: Conjunctivae normal.  Cardiovascular:     Rate and Rhythm: Normal rate.     Pulses: Normal pulses.  Pulmonary:     Effort: Pulmonary effort is normal. No respiratory distress.  Musculoskeletal:        General: Swelling, tenderness and signs of injury present. No deformity.     Left hand: Swelling and bony tenderness present. Decreased range of motion. Normal sensation. There is no disruption of two-point discrimination. Normal capillary refill. Normal pulse.     Comments: TTP over left pointer metacarpal.   Skin:    General: Skin is warm.  Neurological:     General: No focal deficit present.     Mental Status: She is alert.  Psychiatric:        Mood and Affect: Mood normal.        Behavior: Behavior is cooperative.      UC Treatments / Results  Labs (all labs ordered are listed, but only abnormal results are displayed) Labs Reviewed - No data to display  EKG  Radiology DG  Hand Complete Left  Result Date: 01/05/2023 CLINICAL DATA:  pain at first metacarpal, swelling EXAM: LEFT HAND - COMPLETE 3+ VIEW COMPARISON:  None Available. FINDINGS: There is no evidence of fracture or dislocation. There is no evidence of arthropathy or other focal bone abnormality. Soft tissues are unremarkable. IMPRESSION: Negative. Electronically Signed   By: Judie Petit.  Shick M.D.   On: 01/05/2023 14:53   MM 3D DIAGNOSTIC MAMMOGRAM UNILATERAL LEFT BREAST  Result Date: 01/04/2023 CLINICAL DATA:  42 year old female presents for further evaluation of possible LEFT breast asymmetry on screening mammogram. EXAM: DIGITAL DIAGNOSTIC UNILATERAL LEFT MAMMOGRAM WITH TOMOSYNTHESIS; ULTRASOUND LEFT BREAST LIMITED TECHNIQUE: Left digital diagnostic mammography and breast tomosynthesis was performed.; Targeted ultrasound examination of the left breast was performed. COMPARISON:  Previous exam(s). ACR Breast Density Category b: There are scattered areas of fibroglandular density. FINDINGS: Spot compression views of the LEFT breast demonstrate a persistent focal asymmetry within the OUTER to UPPER OUTER LEFT breast, anterior to middle depth, best visualized on the MLO view. Targeted ultrasound is performed, showing no sonographic correlate to the LEFT breast focal asymmetry. A single LEFT axillary lymph node with mild cortical thickening of 4 mm is identified. IMPRESSION: 1. LEFT breast focal asymmetry without sonographic correlate. Tissue sampling is recommended. 2. Single abnormal appearing LEFT axillary lymph node with mild cortical thickening. Tissue sampling is recommended. RECOMMENDATION: 1. Stereotactic guided biopsy of LEFT breast focal asymmetry. 2. Ultrasound-guided biopsy of LEFT axillary lymph node with mild cortical thickening. I have discussed the findings and recommendations with the patient. If applicable, a reminder letter will be sent to the patient regarding the next appointment. BI-RADS CATEGORY  4:  Suspicious. Electronically Signed   By: Harmon Pier M.D.   On: 01/04/2023 15:26  [place] LIMITED ULTRASOUND INCLUDING AXILLA LEFT BREAST   Result Date: 01/04/2023 CLINICAL DATA:  42 year old female presents for further evaluation of possible LEFT breast asymmetry on screening mammogram. EXAM: DIGITAL DIAGNOSTIC UNILATERAL LEFT MAMMOGRAM WITH TOMOSYNTHESIS; ULTRASOUND LEFT BREAST LIMITED TECHNIQUE: Left digital diagnostic mammography and breast tomosynthesis was performed.; Targeted ultrasound examination of the left breast was performed. COMPARISON:  Previous exam(s). ACR Breast Density Category b: There are scattered areas of fibroglandular density. FINDINGS: Spot compression views of the LEFT breast demonstrate a persistent focal asymmetry within the OUTER to UPPER OUTER LEFT breast, anterior to middle depth, best visualized on the MLO view. Targeted ultrasound is performed, showing no sonographic correlate to the LEFT breast focal asymmetry. A single LEFT axillary lymph node with mild cortical thickening of 4 mm is identified. IMPRESSION: 1. LEFT breast focal asymmetry without sonographic correlate. Tissue sampling is recommended. 2. Single abnormal appearing LEFT axillary lymph node with mild cortical thickening. Tissue sampling is recommended. RECOMMENDATION: 1. Stereotactic guided biopsy of LEFT breast focal asymmetry. 2. Ultrasound-guided biopsy of LEFT axillary lymph node with mild cortical thickening. I have discussed the findings and recommendations with the patient. If applicable, a reminder letter will be sent to the patient regarding the next appointment. BI-RADS CATEGORY  4: Suspicious. Electronically Signed   By: Harmon Pier M.D.   On: 01/04/2023 15:26   Procedures Procedures (including critical care time)  Medications Ordered in UC Medications  ketorolac (TORADOL) 30 MG/ML injection 30 mg (30 mg Intramuscular Given 01/05/23 1358)    Initial Impression / Assessment and Plan / UC Course  I  have reviewed the triage vital signs and the nursing notes.  Pertinent labs & imaging results that were available during my care of  the patient were reviewed by me and considered in my medical decision making (see chart for details).  Vitals and triage reviewed, patient is hemodynamically stable.  Has tenderness and swelling over left pointer metacarpal.  Unknown injury last night.  Sensation and ROM intact. Imaging negative for fx or dislocation. Provided w/ brace in clinic, advised to f/u w/ ortho if symptoms persist.  Return to activity as tolerated.  Plan of care, follow-up care and return precautions given, no questions at this time.     Final Clinical Impressions(s) / UC Diagnoses   Final diagnoses:  Contusion of left hand, initial encounter     Discharge Instructions      Your x-rays were negative for any fracture or dislocations.  Please use the wrist brace for compression and support.  We have given you Toradol in clinic for pain, you can start the and her milligrams of ibuprofen every 8 hours tomorrow.  Please rest, ice and elevate your hand.  If your pain persist beyond the next week or so, you can follow-up with [facility] for further evaluation.  Please return to clinic for any new or urgent symptoms.      ED Prescriptions     Medication Sig Dispense Auth. Provider   ibuprofen (ADVIL) 800 MG tablet Take 1 tablet (800 mg total) by mouth 3 (three) times daily. 21 tablet Starlit Raburn, [place] N, [place]      PDMP not reviewed this encounter.   Glyn Gerads, [place] N, [place] 01/05/23 1500

## 2023-01-05 NOTE — Discharge Instructions (Addendum)
Your x-rays were negative for any fracture or dislocations.  Please use the wrist brace for compression and support.  We have given you Toradol in clinic for pain, you can start the and her milligrams of ibuprofen every 8 hours tomorrow.  Please rest, ice and elevate your hand.  If your pain persist beyond the next week or so, you can follow-up with [facility] for further evaluation.  Please return to clinic for any new or urgent symptoms.

## 2023-01-07 ENCOUNTER — Other Ambulatory Visit: Payer: Self-pay | Admitting: Obstetrics & Gynecology

## 2023-01-07 DIAGNOSIS — N6489 Other specified disorders of breast: Secondary | ICD-10-CM

## 2023-01-07 DIAGNOSIS — R599 Enlarged lymph nodes, unspecified: Secondary | ICD-10-CM

## 2023-01-14 ENCOUNTER — Ambulatory Visit
Admission: RE | Admit: 2023-01-14 | Discharge: 2023-01-14 | Disposition: A | Discharge status: Home / Self Care | Payer: 59 | Source: Ambulatory Visit | Attending: Obstetrics & Gynecology | Admitting: Obstetrics & Gynecology

## 2023-01-14 ENCOUNTER — Other Ambulatory Visit: Payer: Self-pay | Admitting: Obstetrics & Gynecology

## 2023-01-14 DIAGNOSIS — N6489 Other specified disorders of breast: Secondary | ICD-10-CM

## 2023-01-14 DIAGNOSIS — R599 Enlarged lymph nodes, unspecified: Secondary | ICD-10-CM

## 2023-01-14 DIAGNOSIS — R590 Localized enlarged lymph nodes: Secondary | ICD-10-CM | POA: Diagnosis not present

## 2023-01-14 DIAGNOSIS — R928 Other abnormal and inconclusive findings on diagnostic imaging of breast: Secondary | ICD-10-CM | POA: Diagnosis not present

## 2023-01-14 HISTORY — PX: BREAST BIOPSY: SHX20

## 2023-01-26 ENCOUNTER — Other Ambulatory Visit: Payer: Self-pay

## 2023-01-26 ENCOUNTER — Emergency Department (HOSPITAL_COMMUNITY)
Admission: EM | Admit: 2023-01-26 | Discharge: 2023-01-27 | Disposition: A | Discharge status: Home / Self Care | Payer: 59 | Attending: Emergency Medicine | Admitting: Emergency Medicine

## 2023-01-26 DIAGNOSIS — L0889 Other specified local infections of the skin and subcutaneous tissue: Secondary | ICD-10-CM | POA: Diagnosis not present

## 2023-01-26 DIAGNOSIS — L089 Local infection of the skin and subcutaneous tissue, unspecified: Secondary | ICD-10-CM

## 2023-01-26 LAB — COMPREHENSIVE METABOLIC PANEL
ALT: 17 U/L (ref 0–44)
AST: 12 U/L — ABNORMAL LOW (ref 15–41)
Albumin: 3.4 g/dL — ABNORMAL LOW (ref 3.5–5.0)
Alkaline Phosphatase: 65 U/L (ref 38–126)
Anion gap: 12 (ref 5–15)
BUN: 14 mg/dL (ref 6–20)
CO2: 24 mmol/L (ref 22–32)
Calcium: 8.9 mg/dL (ref 8.9–10.3)
Chloride: 103 mmol/L (ref 98–111)
Creatinine, Ser: 1.01 mg/dL — ABNORMAL HIGH (ref 0.44–1.00)
GFR, Estimated: >60 mL/min (ref >60)
Glucose, Bld: 95 mg/dL (ref 70–99)
Potassium: 4.2 mmol/L (ref 3.5–5.1)
Sodium: 139 mmol/L (ref 135–145)
Total Bilirubin: 0.5 mg/dL (ref 0.3–1.2)
Total Protein: 6.3 g/dL — ABNORMAL LOW (ref 6.5–8.1)

## 2023-01-26 LAB — CBC WITH DIFFERENTIAL/PLATELET
Abs Immature Granulocytes: 0.02 10*3/uL (ref 0.00–0.07)
Basophils Absolute: 0 10*3/uL (ref 0.0–0.1)
Basophils Relative: 1 %
Eosinophils Absolute: 0.3 10*3/uL (ref 0.0–0.5)
Eosinophils Relative: 3 %
HCT: 34.3 % — ABNORMAL LOW (ref 36.0–46.0)
Hemoglobin: 10.9 g/dL — ABNORMAL LOW (ref 12.0–15.0)
Immature Granulocytes: 0 %
Lymphocytes Relative: 33 %
Lymphs Abs: 2.9 10*3/uL (ref 0.7–4.0)
MCH: 27.5 pg (ref 26.0–34.0)
MCHC: 31.8 g/dL (ref 30.0–36.0)
MCV: 86.4 fL (ref 80.0–100.0)
Monocytes Absolute: 0.4 10*3/uL (ref 0.1–1.0)
Monocytes Relative: 4 %
Neutro Abs: 5.2 10*3/uL (ref 1.7–7.7)
Neutrophils Relative %: 59 %
Platelets: 400 10*3/uL (ref 150–400)
RBC: 3.97 MIL/uL (ref 3.87–5.11)
RDW: 15.1 % (ref 11.5–15.5)
WBC: 8.8 10*3/uL (ref 4.0–10.5)
nRBC: 0 % (ref 0.0–0.2)

## 2023-01-26 NOTE — ED Triage Notes (Signed)
Patient reports infected skin abscess ar left breast today with swelling and drainage .

## 2023-01-27 ENCOUNTER — Other Ambulatory Visit: Payer: Self-pay | Admitting: Obstetrics & Gynecology

## 2023-01-27 ENCOUNTER — Ambulatory Visit
Admission: RE | Admit: 2023-01-27 | Discharge: 2023-01-27 | Disposition: A | Discharge status: Home / Self Care | Payer: 59 | Source: Ambulatory Visit | Attending: Obstetrics & Gynecology | Admitting: Obstetrics & Gynecology

## 2023-01-27 DIAGNOSIS — N611 Abscess of the breast and nipple: Secondary | ICD-10-CM

## 2023-01-27 DIAGNOSIS — T8189XA Other complications of procedures, not elsewhere classified, initial encounter: Secondary | ICD-10-CM | POA: Diagnosis not present

## 2023-01-27 DIAGNOSIS — L0889 Other specified local infections of the skin and subcutaneous tissue: Secondary | ICD-10-CM | POA: Diagnosis not present

## 2023-01-27 MED ORDER — DOXYCYCLINE HYCLATE 100 MG PO TABS
100.0000 mg | ORAL_TABLET | Freq: Once | ORAL | Status: AC
  Administered 2023-01-27: 100 mg via ORAL
  Filled 2023-01-27: qty 1

## 2023-01-27 MED ORDER — DOXYCYCLINE HYCLATE 100 MG PO CAPS: 100.0000 mg | ORAL_CAPSULE | Freq: Two times a day (BID) | ORAL | 0 refills | Status: DC

## 2023-01-27 NOTE — Discharge Instructions (Signed)
Please call the office where he had the biopsy taken first thing in the morning.  They will want to evaluate the wound.  Take antibiotics as prescribed.  If you develop fevers or other systemic symptoms, you should be reevaluated.

## 2023-01-27 NOTE — ED Provider Notes (Signed)
[place] EMERGENCY DEPARTMENT AT [facility] Provider Note   CSN: [id number] Arrival date & time: 01/26/23  1904     History  Chief Complaint  Patient presents with   Infected Wound     Left Breast    Gabriela Best is a 42 y.o. female.  HPI     This is a 42 year old female who presents with concern for drainage from her left breast.  She reports that she had a biopsy of the left breast approximately 2 weeks ago.  She had allowed the bandage to stay on.  She noted some bleeding under the bandage and removed the top bandage but left the Steri-Strips in place.  Since that time she has noted a foul smell.  No systemic symptoms or fevers.  She took off the [employer] and it appeared to be green and draining.  She was concerned for infection.  She cannot tell me where she had the biopsy and has not followed up.  Home Medications Prior to Admission medications   Medication Sig Start Date End Date Taking? Authorizing Provider  acetaminophen (TYLENOL) 325 MG tablet Take 2 tablets (650 mg total) by mouth every 6 (six) hours as needed. 08/04/22   Rising, Lurena Joiner, PA-C  cyclobenzaprine (FLEXERIL) 10 MG tablet Take 1 tablet (10 mg total) by mouth 2 (two) times daily as needed. 08/04/22   Rising, Lurena Joiner, PA-C  HYDROcodone-acetaminophen (NORCO/VICODIN) 5-325 MG tablet Take 1 tablet by mouth every 12 (twelve) hours. 01/23/21   Couture, Cortni S, PA-C  hydrocortisone cream 1 % Apply 1 application topically 4 (four) times daily as needed for itching (eczema).    [provider]  hyoscyamine (LEVSIN/SL) 0.125 MG SL tablet Place 1 tablet (0.125 mg total) under the tongue every 4 (four) hours as needed (pain). Up to 1.25 mg daily 12/25/22   Arthor Captain, PA-C  ibuprofen (ADVIL) 800 MG tablet Take 1 tablet (800 mg total) by mouth 3 (three) times daily. 01/05/23   Garrison, [place] N, FNP  naproxen (NAPROSYN) 375 MG tablet Take 1 tablet (375 mg total) by mouth 2 (two) times  daily with a meal. 12/25/22   Harris, Abigail, PA-C  ondansetron (ZOFRAN) 4 MG tablet Take 1 tablet (4 mg total) by mouth every 8 (eight) hours as needed for nausea or vomiting. 12/25/22   Harris, Abigail, PA-C  ondansetron (ZOFRAN-ODT) 4 MG disintegrating tablet Take 1 tablet (4 mg total) by mouth every 6 (six) hours as needed. 08/04/22   Rising, Lurena Joiner, PA-C  oxyCODONE (OXY IR/ROXICODONE) 5 MG immediate release tablet Take 1 tablet (5 mg total) by mouth every 6 (six) hours as needed for breakthrough pain. 11/14/20   Maczis, Elmer Sow, PA-C  traMADol (ULTRAM) 50 MG tablet Take 1 tablet (50 mg total) by mouth every 6 (six) hours as needed. 08/15/21   Charlynne Pander, MD      Allergies    Patient has no known allergies.    Review of Systems   Review of Systems  Constitutional:  Negative for fever.  Skin:  Positive for wound.  All other systems reviewed and are negative.   Physical Exam Updated Vital Signs BP (!) 167/91   Pulse 66   Temp 98.4 F (36.9 C) (Oral)   Resp 18   SpO2 100%  Physical Exam Vitals and nursing note reviewed.  Constitutional:      Appearance: She is well-developed. She is obese. She is not ill-appearing.  HENT:     Head: Normocephalic and  atraumatic.  Eyes:     Pupils: Pupils are equal, round, and reactive to light.  Cardiovascular:     Rate and Rhythm: Normal rate and regular rhythm.  Pulmonary:     Effort: Pulmonary effort is normal. No respiratory distress.  Chest:       Comments: Ulcerative lesion over the outer upper quadrant of the left breast with green purulent material overlying granulation tissue, no adjacent erythema, no significant tenderness Abdominal:     Palpations: Abdomen is soft.  Musculoskeletal:     Cervical back: Neck supple.  Skin:    General: Skin is warm and dry.  Neurological:     Mental Status: She is alert and oriented to person, place, and time.  Psychiatric:        Mood and Affect: Mood normal.     ED Results /  Procedures / Treatments   Labs (all labs ordered are listed, but only abnormal results are displayed) Labs Reviewed  CBC WITH DIFFERENTIAL/PLATELET - Abnormal; Notable for the following components:      Result Value   Hemoglobin 10.9 (*)    HCT 34.3 (*)    All other components within normal limits  COMPREHENSIVE METABOLIC PANEL - Abnormal; Notable for the following components:   Creatinine, Ser 1.01 (*)    Total Protein 6.3 (*)    Albumin 3.4 (*)    AST 12 (*)    All other components within normal limits    EKG None  Radiology No results found.  Procedures Procedures    Medications Ordered in ED Medications - No data to display  ED Course/ Medical Decision Making/ A&P                             Medical Decision Making Amount and/or Complexity of Data Reviewed Labs: ordered.  Risk Prescription drug management.   This patient presents to the ED for concern of wound infection, this involves an extensive number of treatment options, and is a complaint that carries with it a high risk of complications and morbidity.  I considered the following differential and admission for this acute, potentially life threatening condition.  The differential diagnosis includes infection, ulcerative mass, other complication  MDM:    This is a 42 year old female who presents with concern for infection of the left breast.  She describes having had biopsy although I cannot see any notes to confirm this.  She states that she has not had any systemic symptoms and is well-appearing overall.  There is no adjacent erythema.  She does have some purulent tissue on the top of the wound with what appears to be underlying granulation tissue.  Labs obtained and reviewed and reassuring without significant leukocytosis.  I attempted to debride the wound but the patient did not tolerate much of this.  Antibiotic ointment was placed.  Will start on doxycycline and I instructed her to call her breast center  first thing in the morning.  (Labs, imaging, consults)  Labs: I Ordered, and personally interpreted labs.  The pertinent results include: CBC, BMP  Imaging Studies ordered: I ordered imaging studies including none I independently visualized and interpreted imaging. I agree with the radiologist interpretation  Additional history obtained from chart review.  External records from outside source obtained and reviewed including prior evaluations  Cardiac Monitoring: The patient was not maintained on a cardiac monitor.  If on the cardiac monitor, I personally viewed and interpreted the cardiac  monitored which showed an underlying rhythm of: N/A  Reevaluation: After the interventions noted above, I reevaluated the patient and found that they have :stayed the same  Social Determinants of Health:  lives independently  Disposition: Discharge  Co morbidities that complicate the patient evaluation  Past Medical History:  Diagnosis Date   Abnormal menstrual cycle    Migraine    Ovarian cyst      Medicines Meds ordered this encounter  Medications   doxycycline (VIBRA-TABS) tablet 100 mg   doxycycline (VIBRAMYCIN) 100 MG capsule    Sig: Take 1 capsule (100 mg total) by mouth 2 (two) times daily.    Dispense:  20 capsule    Refill:  0    I have reviewed the patients home medicines and have made adjustments as needed  Problem List / ED Course: Problem List Items Addressed This Visit   None Visit Diagnoses     Wound infection    -  Primary                   Final Clinical Impression(s) / ED Diagnoses Final diagnoses:  None    Rx / DC Orders ED Discharge Orders     None         Shon Baton, MD 01/27/23 [phone]

## 2023-02-04 ENCOUNTER — Telehealth: Payer: Self-pay | Admitting: Licensed Clinical Social Worker

## 2023-02-07 NOTE — Patient Outreach (Signed)
  Care Coordination   Initial Visit Note   02/04/2023 Name: Gabriela Best MRN: [id number] DOB: 09-18-1980  Gabriela Best is a 42 y.o. year old female who sees Pcp, No for primary care. I spoke with  Trevor Iha by phone today.  What matters to the patients health and wellness today?  Medical Support    Goals Addressed             This Visit's Progress    Obtain Supportive Resources   On track    Activities and task to complete in order to accomplish goals.   Keep all upcoming appointments discussed today Continue with compliance of taking medication prescribed by Doctor Implement healthy coping skills discussed to assist with management of symptoms         SDOH assessments and interventions completed:  No     Care Coordination Interventions:  Yes, provided  Interventions Today    Flowsheet Row Most Recent Value  General Interventions   General Interventions Discussed/Reviewed General Interventions Discussed, Doctor Visits  [Patient reports concern for wound stating it is green/brown in color and has an odor. Pt has a f/up appt with [facility] to assess]  Doctor Visits Discussed/Reviewed Doctor Visits Discussed, PCP  [Discussed how to obtain a PCP]  Mental Health Interventions   Mental Health Discussed/Reviewed Mental Health Discussed, Coping Strategies  Nutrition Interventions   Nutrition Discussed/Reviewed Nutrition Discussed  Pharmacy Interventions   Pharmacy Dicussed/Reviewed Pharmacy Topics Discussed, Medication Adherence  [Pt reports that she has almost completed antibiotics]  Safety Interventions   Safety Discussed/Reviewed Safety Discussed       Follow up plan: Follow up call scheduled for 1-2 weeks    Encounter Outcome:  Pt. Visit Completed   Jenel Lucks, MSW, LCSW [facility] Care Management [facility]  [facility] [email or web address] Phone [phone] 11:55 PM

## 2023-02-07 NOTE — Patient Instructions (Signed)
Visit Information  Thank you for taking time to visit with me today. Please don't hesitate to contact me if I can be of assistance to you.   Following are the goals we discussed today:   Goals Addressed             This Visit's Progress    Obtain Supportive Resources   On track    Activities and task to complete in order to accomplish goals.   Keep all upcoming appointments discussed today Continue with compliance of taking medication prescribed by Doctor Implement healthy coping skills discussed to assist with management of symptoms         Our next appointment is by telephone on 07/30 at 3 PM  Please call the care guide team at [phone] if you need to cancel or reschedule your appointment.   If you are experiencing a Mental Health or Behavioral Health Crisis or need someone to talk to, please call the Suicide and Crisis Lifeline: 988 call 911   The patient verbalized understanding of instructions, educational materials, and care plan provided today and DECLINED offer to receive copy of patient instructions, educational materials, and care plan.   Jenel Lucks, MSW, LCSW [facility] Care Management [place]  [email or web address] Phone [phone] 11:56 PM

## 2023-02-15 ENCOUNTER — Ambulatory Visit: Payer: Self-pay | Admitting: Licensed Clinical Social Worker

## 2023-02-16 NOTE — Patient Outreach (Signed)
  Care Coordination   Follow Up Visit Note   02/15/2023 Name: Lainee Wishon MRN: [id number] DOB: 31-Dec-1980  April Gamba is a 42 y.o. year old female who sees Pcp, No for primary care. I spoke with  Trevor Iha by phone today.  What matters to the patients health and wellness today?  Symptom management    Goals Addressed             This Visit's Progress    Obtain Supportive Resources   On track    Activities and task to complete in order to accomplish goals.   Keep all upcoming appointments discussed today Continue with compliance of taking medication prescribed by Doctor Implement healthy coping skills discussed to assist with management of symptoms  Review supportive information on mobile clinic via email        SDOH assessments and interventions completed:  No     Care Coordination Interventions:  Yes, provided  Interventions Today    Flowsheet Row Most Recent Value  General Interventions   General Interventions Discussed/Reviewed General Interventions Reviewed, Doctor Visits, Community Resources  Doctor Visits Discussed/Reviewed Doctor Visits Reviewed  Mental Health Interventions   Mental Health Discussed/Reviewed Mental Health Reviewed, Coping Strategies  Pharmacy Interventions   Pharmacy Dicussed/Reviewed Medication Adherence, Pharmacy Topics Reviewed       Follow up plan: Follow up call scheduled for 2 weeks    Encounter Outcome:  Pt. Visit Completed   Jenel Lucks, MSW, LCSW [facility] Care Management [facility]  [facility] [email or web address] Phone [phone] 5:39 PM

## 2023-02-16 NOTE — Patient Instructions (Signed)
Visit Information  Thank you for taking time to visit with me today. Please don't hesitate to contact me if I can be of assistance to you.   Following are the goals we discussed today:   Goals Addressed             This Visit's Progress    Obtain Supportive Resources   On track    Activities and task to complete in order to accomplish goals.   Keep all upcoming appointments discussed today Continue with compliance of taking medication prescribed by Doctor Implement healthy coping skills discussed to assist with management of symptoms  Review supportive information on mobile clinic via email        Please call the care guide team at [phone] if you need to cancel or reschedule your appointment.   If you are experiencing a Mental Health or Behavioral Health Crisis or need someone to talk to, please call the Suicide and Crisis Lifeline: 988 call 911   Patient verbalizes understanding of instructions and care plan provided today and agrees to view in MyChart. Active MyChart status and patient understanding of how to access instructions and care plan via MyChart confirmed with patient.     Jenel Lucks, MSW, LCSW [facility] Care Management [place]  [facility] [email or web address] Phone [phone] 5:39 PM

## 2023-03-07 ENCOUNTER — Telehealth: Payer: Self-pay

## 2023-03-07 ENCOUNTER — Ambulatory Visit: Payer: Self-pay | Admitting: *Deleted
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
## 2023-03-07 NOTE — Telephone Encounter (Signed)
Pt would like to speak to Gabriela Best asap.  Pt spoke to triage and she said it did not help her.  Pt states she felt a connection w/ Gabriela Best. Please call.

## 2023-03-08 ENCOUNTER — Telehealth: Payer: Self-pay | Admitting: Licensed Clinical Social Worker

## 2023-03-09 NOTE — Patient Instructions (Signed)
Visit Information  Thank you for taking time to visit with me today. Please don't hesitate to contact me if I can be of assistance to you.   Following are the goals we discussed today:   Goals Addressed             This Visit's Progress    Obtain Supportive Resources   On track    Activities and task to complete in order to accomplish goals.   Keep all upcoming appointments discussed today Continue with compliance of taking medication prescribed by Doctor Implement healthy coping skills discussed to assist with management of symptoms  Review supportive information on crisis intervention resources        Our next appointment is by telephone on 08/29 at 3:30 pm  Please call the care guide team at [phone] if you need to cancel or reschedule your appointment.   If you are experiencing a Mental Health or Behavioral Health Crisis or need someone to talk to, please call the Suicide and Crisis Lifeline: 988 call 911   Patient verbalizes understanding of instructions and care plan provided today and agrees to view in MyChart. Active MyChart status and patient understanding of how to access instructions and care plan via MyChart confirmed with patient.     Jenel Lucks, MSW, LCSW [facility] Care Management [place]  [facility] [email or web address] Phone [phone] 6:55 AM

## 2023-03-09 NOTE — Patient Outreach (Signed)
  Care Coordination   Follow Up Visit Note   03/08/2023 Name: Trystyn Breyette MRN: [id number] DOB: 28-Feb-1981  Lazara Bienvenue is a 42 y.o. year old female who sees Pcp, No for primary care. I spoke with  Trevor Iha by phone today.  What matters to the patients health and wellness today?  Crisis intervention    Goals Addressed             This Visit's Progress    Obtain Supportive Resources   On track    Activities and task to complete in order to accomplish goals.   Keep all upcoming appointments discussed today Continue with compliance of taking medication prescribed by Doctor Implement healthy coping skills discussed to assist with management of symptoms  Review supportive information on crisis intervention resources        SDOH assessments and interventions completed:  No     Care Coordination Interventions:  Yes, provided  Interventions Today    Flowsheet Row Most Recent Value  General Interventions   General Interventions Discussed/Reviewed General Interventions Reviewed, Community Resources  Mental Health Interventions   Mental Health Discussed/Reviewed Mental Health Reviewed, Coping Strategies, Anxiety, Depression, Crisis  Safety Interventions   Safety Discussed/Reviewed Safety Reviewed, Home Safety       Follow up plan: Follow up call scheduled for 1-2 weeks    Encounter Outcome:  Pt. Visit Completed   Jenel Lucks, MSW, LCSW [facility] Care Management [facility] Health  [facility] [email or web address] Phone [phone] 6:53 AM

## 2023-03-17 ENCOUNTER — Encounter: Payer: Self-pay | Admitting: Licensed Clinical Social Worker

## 2023-03-25 ENCOUNTER — Ambulatory Visit (INDEPENDENT_AMBULATORY_CARE_PROVIDER_SITE_OTHER): Payer: 59 | Admitting: Obstetrics and Gynecology

## 2023-03-25 ENCOUNTER — Other Ambulatory Visit: Payer: Self-pay

## 2023-03-25 ENCOUNTER — Other Ambulatory Visit (HOSPITAL_COMMUNITY)
Admission: RE | Admit: 2023-03-25 | Discharge: 2023-03-25 | Disposition: A | Discharge status: Home / Self Care | Payer: 59 | Source: Ambulatory Visit | Attending: Obstetrics and Gynecology | Admitting: Obstetrics and Gynecology

## 2023-03-25 ENCOUNTER — Encounter: Payer: Self-pay | Admitting: Obstetrics and Gynecology

## 2023-03-25 VITALS — BP 160/103 | HR 69 | Ht 63.0 in | Wt 274.5 lb

## 2023-03-25 DIAGNOSIS — Z01419 Encounter for gynecological examination (general) (routine) without abnormal findings: Secondary | ICD-10-CM | POA: Insufficient documentation

## 2023-03-25 DIAGNOSIS — Z1151 Encounter for screening for human papillomavirus (HPV): Secondary | ICD-10-CM | POA: Diagnosis not present

## 2023-03-25 DIAGNOSIS — D219 Benign neoplasm of connective and other soft tissue, unspecified: Secondary | ICD-10-CM

## 2023-03-25 DIAGNOSIS — Z124 Encounter for screening for malignant neoplasm of cervix: Secondary | ICD-10-CM | POA: Diagnosis not present

## 2023-03-25 DIAGNOSIS — N760 Acute vaginitis: Secondary | ICD-10-CM | POA: Insufficient documentation

## 2023-03-25 DIAGNOSIS — B9689 Other specified bacterial agents as the cause of diseases classified elsewhere: Secondary | ICD-10-CM | POA: Diagnosis not present

## 2023-03-25 DIAGNOSIS — Z113 Encounter for screening for infections with a predominantly sexual mode of transmission: Secondary | ICD-10-CM

## 2023-03-25 MED ORDER — MEFENAMIC ACID 250 MG PO CAPS: ORAL_CAPSULE | ORAL | 2 refills | Status: DC

## 2023-03-25 MED ORDER — ONDANSETRON HCL 4 MG PO TABS: 4.0000 mg | ORAL_TABLET | Freq: Three times a day (TID) | ORAL | 1 refills | Status: DC | PRN

## 2023-03-25 NOTE — Progress Notes (Signed)
Obstetrics and Gynecology New Patient Evaluation  Appointment Date: 03/25/2023  OBGYN Clinic: Center for [facility]   Primary Care Provider: Pcp, No  Referring Provider: No ref. provider found  Chief Complaint:  Chief Complaint  Patient presents with   Gynecologic Exam    History of Present Illness: Gabriela Best is a 42 y.o.  G1P0010 (Patient's last menstrual period was 03/11/2023 (approximate).), seen for the above chief complaint. Her past medical history is significant for BMI 40s, HTN, h/o l/s chole, h/o perforated appendix   Patient with abdominal pain and had June 2024 ED CT scan that showed "exophytic uterine fibroid off the left uterine fundus. Bilateral adnexa are unremarkable."  No measurements given. Patient with Gabriela Best, regular periods that are 3-4days and are somewhat heavy and have been somewhat painful but this is not new. She had a hormone IUD that was placed at the [facility] and was in for about 3 years and this somewhat helped with her periods but she had this removed a year ago because she was trying to conceive; patient is in a same sex relationship and still interested in trying to conceive.   She had an u/s in January 2023 in the ED for pain and showed no fibroids with uterine size of 12 x 5.6 x 4.4cm and ES of 5mm and normal adnexa.   Review of Systems: Pertinent items noted in HPI and remainder of comprehensive ROS otherwise negative.   Past Medical History:  Past Medical History:  Diagnosis Date   Abnormal menstrual cycle    Acute perforated appendicitis 11/14/2020   Migraine    Ovarian cyst    Symptomatic cholelithiasis 11/13/2020    Past Surgical History:  Past Surgical History:  Procedure Laterality Date   BREAST BIOPSY Left 01/14/2023   MM LT BREAST BX W LOC DEV 1ST LESION IMAGE BX SPEC STEREO GUIDE 01/14/2023 [facility] MAMMOGRAPHY   CHOLECYSTECTOMY N/A 11/13/2020   Procedure: LAPAROSCOPIC CHOLECYSTECTOMY WITH INTRAOPERATIVE  CHOLANGIOGRAM;  Surgeon: Quentin Ore, MD;  Location: [facility];  Service: General;  Laterality: N/A;   KNEE SURGERY     KNEE SURGERY Left     Past Obstetrical History:  OB History  Gravida Para Term Preterm AB Living  1       1    SAB IAB Ectopic Multiple Live Births    1          # Outcome Date GA Lbr Len/2nd Weight Sex Type Anes PTL Lv  1 IAB             Obstetric Comments  Age 19 y/o EAB, d&c    Past Gynecological History: As per HPI.  Social History:  Social History   Socioeconomic History   Marital status: Single    Spouse name: Not on file   Number of children: Not on file   Years of education: Not on file   Highest education level: Not on file  Occupational History   Not on file  Tobacco Use   Smoking status: Every Day    Current packs/day: 0.15    Types: Cigarettes   Smokeless tobacco: Never  Vaping Use   Vaping status: Never Used  Substance and Sexual Activity   Alcohol use: Yes    Comment: occ   Drug use: Never   Sexual activity: Yes    Comment: same partner  Other Topics Concern   Not on file  Social History Narrative   Not on file   Social  Determinants of Health   Financial Resource Strain: Not on file  Food Insecurity: No Food Insecurity (12/28/2022)   Hunger Vital Sign    Worried About Running Out of Food in the Last Year: Never true    Ran Out of Food in the Last Year: Never true  Transportation Needs: No Transportation Needs (12/28/2022)   PRAPARE - Administrator, Civil Service (Medical): No    Lack of Transportation (Non-Medical): No  Physical Activity: Not on file  Stress: Not on file  Social Connections: Unknown (12/01/2021)   [facility]   Social Network    Social Network: Not on file  Intimate Partner Violence: Unknown (10/23/2021)   Received [facility]   HITS    Physically Hurt: Not on file    Insult or Talk Down To: Not on file    Threaten Physical Harm:  Not on file    Scream or Curse: Not on file    Family History:  Family History  Problem Relation Age of Onset   Breast cancer Maternal Grandmother     Medications Edia Cirigliano had no medications administered during this visit. Current Outpatient Medications  Medication Sig Dispense Refill   acetaminophen (TYLENOL) 325 MG tablet Take 2 tablets (650 mg total) by mouth every 6 (six) hours as needed. (Patient not taking: Reported on 03/25/2023) 30 tablet 0   cyclobenzaprine (FLEXERIL) 10 MG tablet Take 1 tablet (10 mg total) by mouth 2 (two) times daily as needed. (Patient not taking: Reported on 03/25/2023) 20 tablet 0   doxycycline (VIBRAMYCIN) 100 MG capsule Take 1 capsule (100 mg total) by mouth 2 (two) times daily. (Patient not taking: Reported on 03/25/2023) 20 capsule 0   HYDROcodone-acetaminophen (NORCO/VICODIN) 5-325 MG tablet Take 1 tablet by mouth every 12 (twelve) hours. (Patient not taking: Reported on 03/25/2023) 6 tablet 0   hydrocortisone cream 1 % Apply 1 application topically 4 (four) times daily as needed for itching (eczema). (Patient not taking: Reported on 03/25/2023)     hyoscyamine (LEVSIN/SL) 0.125 MG SL tablet Place 1 tablet (0.125 mg total) under the tongue every 4 (four) hours as needed (pain). Up to 1.25 mg daily (Patient not taking: Reported on 03/25/2023) 30 tablet 1   Mefenamic Acid 250 MG CAPS 1 capsule po q6h x 3 days starting the day before your period 30 capsule 2   ondansetron (ZOFRAN) 4 MG tablet Take 1 tablet (4 mg total) by mouth every 8 (eight) hours as needed for nausea or vomiting. 30 tablet 1   oxyCODONE (OXY IR/ROXICODONE) 5 MG immediate release tablet Take 1 tablet (5 mg total) by mouth every 6 (six) hours as needed for breakthrough pain. (Patient not taking: Reported on 03/25/2023) 15 tablet 0   traMADol (ULTRAM) 50 MG tablet Take 1 tablet (50 mg total) by mouth every 6 (six) hours as needed. (Patient not taking: Reported on 03/25/2023) 10 tablet 0   No current  facility-administered medications for this visit.    Allergies Patient has no known allergies.   Physical Exam:  BP (!) 160/103   Pulse 69   Ht 5\' 3"  (1.6 m)   Wt 274 lb 8 oz (124.5 kg)   LMP 03/11/2023 (Approximate)   BMI 48.63 kg/m  Body mass index is 48.63 kg/m. General appearance: Well nourished, well developed female in no acute distress.  Neck:  Supple, normal appearance, and no thyromegaly  Cardiovascular: normal s1 and s2.  No murmurs,  rubs or gallops. Respiratory:  Clear to auscultation bilateral. Normal respiratory effort Abdomen: obese, nttp, nd Neuro/Psych:  Normal mood and affect.  Skin:  Warm and dry.  Lymphatic:  No inguinal lymphadenopathy.   Cervical exam performed in the presence of a chaperone Pelvic exam: is limited by body habitus EGBUS: within normal limits Vagina: within normal limits and with no blood or discharge in the vault Cervix: normal appearing cervix without tenderness, discharge or lesions. Uterus:  difficult to palpate but non tender Adnexa:  normal adnexa and no mass, fullness, tenderness Rectovaginal: deferred  Laboratory: as per HPI  Radiology: as per HPI  Assessment: patient stable  Plan: 1. Cervical cancer screening - Cytology - PAP( [place])  2. Screening for STD (sexually transmitted disease) - Cytology - PAP( [place]) - RPR+HBsAg+HCVAb+...  3. Fibroid I looked at the CT images and overall uterine size about 13cm and it looks like there is a 3cm pedunculated fibroid present. I ordered another u/s. Rx strength NSAID for three days,  ideally to start the day before normal flow. I told her another hormone IUD would be an acceptable option assuming u/s is okay. I told her that her age is an obstacle to fertility, but I also recommend trying to get pregnant at this time given lack of a PCP and her co-morbidities that we know of such as her BMI and her HTN.  - US PELVIC COMPLETE WITH TRANSVAGINAL; Future  4. Screen for STD  (sexually transmitted disease) - Cervicovaginal ancillary only( [place])  5. HTN She does not have a PCP. I showed her on the [facility] homepage how to make her own PCP appointment and importance of this d/w her.   Orders Placed This Encounter  Procedures   US PELVIC COMPLETE WITH TRANSVAGINAL   RPR+HBsAg+HCVAb+...    RTC after u/s  No follow-ups on file.  Future Appointments  Date Time Provider Department Center  03/29/2023  4:00 PM [facility] 2 [facility]  04/11/2023  8:15 AM Darling Bing, MD [facility]    Cornelia Copa MD Attending Center for [facility])

## 2023-03-26 LAB — RPR+HBSAG+HCVAB+...
HIV Screen 4th Generation wRfx: NONREACTIVE
Hep C Virus Ab: NONREACTIVE
Hepatitis B Surface Ag: NEGATIVE
RPR Ser Ql: NONREACTIVE

## 2023-03-28 LAB — CERVICOVAGINAL ANCILLARY ONLY
Bacterial Vaginitis (gardnerella): POSITIVE — AB
Candida Glabrata: NEGATIVE
Candida Vaginitis: NEGATIVE
Chlamydia: NEGATIVE
Comment: NEGATIVE
Comment: NEGATIVE
Comment: NEGATIVE
Comment: NEGATIVE
Comment: NEGATIVE
Comment: NORMAL
Neisseria Gonorrhea: NEGATIVE
Trichomonas: NEGATIVE

## 2023-03-29 ENCOUNTER — Ambulatory Visit (HOSPITAL_COMMUNITY)
Admission: RE | Admit: 2023-03-29 | Discharge: 2023-03-29 | Disposition: A | Discharge status: Home / Self Care | Payer: 59 | Source: Ambulatory Visit | Attending: Obstetrics and Gynecology | Admitting: Obstetrics and Gynecology

## 2023-03-29 DIAGNOSIS — D219 Benign neoplasm of connective and other soft tissue, unspecified: Secondary | ICD-10-CM | POA: Insufficient documentation

## 2023-03-29 DIAGNOSIS — D259 Leiomyoma of uterus, unspecified: Secondary | ICD-10-CM | POA: Diagnosis not present

## 2023-03-29 LAB — CYTOLOGY - PAP
Chlamydia: NEGATIVE
Comment: NEGATIVE
Comment: NEGATIVE
Comment: NEGATIVE
Comment: NORMAL
Diagnosis: NEGATIVE
High risk HPV: NEGATIVE
Neisseria Gonorrhea: NEGATIVE
Trichomonas: NEGATIVE

## 2023-03-29 MED ORDER — METRONIDAZOLE 500 MG PO TABS: 500.0000 mg | ORAL_TABLET | Freq: Two times a day (BID) | ORAL | 0 refills | Status: AC

## 2023-04-07 ENCOUNTER — Telehealth: Payer: Self-pay | Admitting: Licensed Clinical Social Worker

## 2023-04-07 NOTE — Patient Outreach (Signed)
Care Coordination   Follow Up Visit Note   04/05/2023 Name: Teola Kitto MRN: [id number] DOB: 15-Jun-1981  Marvette Figuero is a 42 y.o. year old female who sees Pcp, No for primary care. I spoke with  Trevor Iha by phone today.  What matters to the patients health and wellness today?  Symptom Management    Goals Addressed             This Visit's Progress    Obtain Supportive Resources   On track    Activities and task to complete in order to accomplish goals.   Keep all upcoming appointments discussed today Continue with compliance of taking medication prescribed by Doctor Implement healthy coping skills discussed to assist with management of symptoms  Review supportive information on crisis intervention resources F/up with MyChart regarding local PCP resources        SDOH assessments and interventions completed:  No     Care Coordination Interventions:  Yes, provided  Interventions Today    Flowsheet Row Most Recent Value  General Interventions   General Interventions Discussed/Reviewed General Interventions Reviewed, Doctor Visits  [LCSW discussed how to establish with a local PCP office.]  Mental Health Interventions   Mental Health Discussed/Reviewed Mental Health Reviewed, Coping Strategies, Anxiety, Depression, Grief and Loss  [LCSW allowed pt to process strong emotions regarding current relationship, establishing boundaries, and strategies to improve self-care]       Follow up plan: Follow up call scheduled for 1-2 weeks    Encounter Outcome:  Patient Visit Completed   Jenel Lucks, MSW, LCSW [facility] Care Management [facility] Health  [facility] [email or web address] Phone [phone] 12:30 PM

## 2023-04-07 NOTE — Patient Instructions (Signed)
Visit Information  Thank you for taking time to visit with me today. Please don't hesitate to contact me if I can be of assistance to you.   Following are the goals we discussed today:   Goals Addressed             This Visit's Progress    Obtain Supportive Resources   On track    Activities and task to complete in order to accomplish goals.   Keep all upcoming appointments discussed today Continue with compliance of taking medication prescribed by Doctor Implement healthy coping skills discussed to assist with management of symptoms  Review supportive information on crisis intervention resources F/up with MyChart regarding local PCP resources        Our next appointment is by telephone on 09/20 at 1 PM  Please call the care guide team at [phone] if you need to cancel or reschedule your appointment.   If you are experiencing a Mental Health or Behavioral Health Crisis or need someone to talk to, please call the Suicide and Crisis Lifeline: 988 call 911   Patient verbalizes understanding of instructions and care plan provided today and agrees to view in MyChart. Active MyChart status and patient understanding of how to access instructions and care plan via MyChart confirmed with patient.     Jenel Lucks, MSW, LCSW [facility] Care Management [place]  [facility] [email or web address] Phone [phone] 12:30 PM

## 2023-04-08 ENCOUNTER — Ambulatory Visit: Payer: Self-pay | Admitting: Licensed Clinical Social Worker

## 2023-04-11 ENCOUNTER — Encounter: Payer: Self-pay | Admitting: Obstetrics and Gynecology

## 2023-04-11 ENCOUNTER — Ambulatory Visit (INDEPENDENT_AMBULATORY_CARE_PROVIDER_SITE_OTHER): Payer: 59 | Admitting: Obstetrics and Gynecology

## 2023-04-11 VITALS — BP 134/86 | HR 66 | Ht 63.0 in | Wt 270.1 lb

## 2023-04-11 DIAGNOSIS — I10 Essential (primary) hypertension: Secondary | ICD-10-CM | POA: Insufficient documentation

## 2023-04-11 DIAGNOSIS — Z0000 Encounter for general adult medical examination without abnormal findings: Secondary | ICD-10-CM | POA: Diagnosis not present

## 2023-04-11 DIAGNOSIS — D219 Benign neoplasm of connective and other soft tissue, unspecified: Secondary | ICD-10-CM | POA: Diagnosis not present

## 2023-04-11 DIAGNOSIS — Z6841 Body Mass Index (BMI) 40.0 and over, adult: Secondary | ICD-10-CM | POA: Diagnosis not present

## 2023-04-11 MED ORDER — ONDANSETRON HCL 4 MG PO TABS: 4.0000 mg | ORAL_TABLET | Freq: Three times a day (TID) | ORAL | 1 refills | Status: DC | PRN

## 2023-04-11 NOTE — Patient Outreach (Signed)
Care Coordination   Follow Up Visit Note   04/08/2023 Name: Gabriela Best MRN: [id number] DOB: [birth year]  Gabriela Best is a 42 y.o. year old female who sees Pcp, No for primary care. I spoke with  Trevor Iha by phone today.  What matters to the patients health and wellness today?  Symptom Management    Goals Addressed             This Visit's Progress    Obtain Supportive Resources   On track    Activities and task to complete in order to accomplish goals.   Keep all upcoming appointments discussed today Continue with compliance of taking medication prescribed by Doctor Implement healthy coping skills discussed to assist with management of symptoms  Review supportive information on crisis intervention resources F/up with MyChart regarding local PCP resources        SDOH assessments and interventions completed:  No     Care Coordination Interventions:  Yes, provided  Interventions Today    Flowsheet Row Most Recent Value  Chronic Disease   Chronic disease during today's visit Hypertension (HTN)  General Interventions   General Interventions Discussed/Reviewed General Interventions Reviewed, Doctor Visits  Doctor Visits Discussed/Reviewed Doctor Visits Reviewed, PCP  [Patient has not received listing of PCP offices from MyChart. Has been referred from specialist]  Mental Health Interventions   Mental Health Discussed/Reviewed Mental Health Reviewed, Coping Strategies, Anxiety, Depression  [Triggers to increase in mental health symptoms identified. Self-care and anger management strategies discussed. Pt is open to counseling and/or support group to manage symptoms]  Safety Interventions   Safety Discussed/Reviewed Safety Reviewed       Follow up plan: Follow up call scheduled for 1-2 weeks    Encounter Outcome:  Patient Visit Completed   Jenel Lucks, MSW, LCSW [facility] Care Management [facility] Health  [facility] [email or web address] Phone  [phone] 6:15 PM

## 2023-04-11 NOTE — Progress Notes (Signed)
Obstetrics and Gynecology Visit Return Patient Evaluation  Appointment Date: 04/11/2023  Primary Care Provider: Pcp, No  OBGYN Clinic: [facility] for [facility]  Chief Complaint: ultrasound follow up  History of Present Illness:  Gabriela Best is a 42 y.o. Her past medical history is significant for BMI 40s, HTN, h/o l/s chole, h/o perforated appendix   Patient seen for new patient consult on 9/6. At that time she had pain and nausea with fibroid uterus, at the time of her periods, that was somewhat helped with a mirena, which she had removed because she was thinking of conceiving.   Patient with abdominal pain and had June 2024 ED CT scan that showed "exophytic uterine fibroid off the left uterine fundus. Bilateral adnexa are unremarkable."  No measurements given. Patient with Gabriela Best, regular periods that are 3-4days and are somewhat heavy and have been somewhat painful but this is not new. She had a hormone IUD that was placed at the [facility] and was in for about 3 years and this somewhat helped with her periods but she had this removed a year ago because she was trying to conceive; patient is in a same sex relationship and still interested in trying to conceive.    She had an u/s in January 2023 in the ED for pain and showed no fibroids with uterine size of 12 x 5.6 x 4.4cm and ES of 5mm and normal adnexa.   When I saw her her I sent in mefenamic acid and zofran and she had a negative pap and hpv test, sti bloodwork and swab (+for BV)   Interval History: Since that time, she states that her period just stated and same s/s. mefenamic acid too not covered by insurance; she didn't know if the zofran was ready for pick up. She has not heard about PCP new patient visit yet. U/s today shows 5-6cm fundal, IM fibroid, normal endometrium  Review of Systems: as noted in the History of Present Illness.  Medications: None  Allergies: has No Known Allergies.  Physical Exam:  BP  134/86   Pulse 66   Ht 5\' 3"  (1.6 m)   Wt 270 lb 1.6 oz (122.5 kg)   LMP 04/10/2023 (Exact Date)   BMI 47.85 kg/m  Body mass index is 47.85 kg/m. General appearance: Well nourished, well developed female in no acute distress.  Neuro/Psych:  Normal mood and affect.    Radiology: images reviewed, see above. 9/10 u/s still not finalized by Radiology   Assessment: patient stable  Plan:  1. Well adult health check I went on mychart with her and set her up with a PCP for early next month. Baseline labs today - Comprehensive metabolic panel - CBC - TSH Rfx on Abnormal to Free T4 - Hemoglobin A1c - Lipid panel - VITAMIN D 25 Hydroxy (Vit-D Deficiency, Fractures)  2. Hypertension, unspecified type - Ambulatory referral to Internal Medicine - VITAMIN D 25 Hydroxy (Vit-D Deficiency, Fractures)  3. Fibroid Her s/s are pain related with nausea, vomiting and pain with her periods. I told her I recommend to continue with OTC NSAIDs and zofran sent in for her. I told her that the fact that her prior mirena helped some shows it's likely related to her hormone cycle and that a systemic method is the next best option, if she wanted to try something, since she likely needs cycle suppression; pt declines this currently. I also told her that it looks like she just has one fibroid and that  since the Mirena helped some that removal may help with the pain. I d/w her re: sonata and myomectomy and she is interested in the latter. I told her I would set her up with one of my partners to see if this seems worthwhile and if so, that she may be able to do it laparoscopically.   I'll call radiology to see if they can take a look at the images, too  4. BMI 45.0-49.9, adult [facility])  Return for needs consult visit with Dr. Briscoe Deutscher re: myomectomy.  Future Appointments  Date Time Provider Department Center  04/28/2023  2:40 PM Ngetich, Donalee Citrin, NP [facility] None  08/23/2023  8:50 AM Marcine Matar, MD [facility]  None    Cornelia Copa MD Attending Center for [facility])

## 2023-04-11 NOTE — Patient Instructions (Signed)
Visit Information  Thank you for taking time to visit with me today. Please don't hesitate to contact me if I can be of assistance to you.   Following are the goals we discussed today:   Goals Addressed             This Visit's Progress    Obtain Supportive Resources   On track    Activities and task to complete in order to accomplish goals.   Keep all upcoming appointments discussed today Continue with compliance of taking medication prescribed by Doctor Implement healthy coping skills discussed to assist with management of symptoms  Review supportive information on crisis intervention resources F/up with MyChart regarding local PCP resources        Our next appointment is by telephone on 10/1 at 1 PM  Please call the care guide team at [phone] if you need to cancel or reschedule your appointment.   If you are experiencing a Mental Health or Behavioral Health Crisis or need someone to talk to, please call the Suicide and Crisis Lifeline: 988 call 911   Patient verbalizes understanding of instructions and care plan provided today and agrees to view in MyChart. Active MyChart status and patient understanding of how to access instructions and care plan via MyChart confirmed with patient.     Jenel Lucks, MSW, LCSW [facility] Care Management [place]  [facility] [email or web address] Phone [phone] 6:17 PM

## 2023-04-12 LAB — COMPREHENSIVE METABOLIC PANEL
ALT: 8 IU/L (ref 0–32)
AST: 12 IU/L (ref 0–40)
Albumin: 3.9 g/dL (ref 3.9–4.9)
Alkaline Phosphatase: 66 IU/L (ref 44–121)
BUN/Creatinine Ratio: 13 (ref 9–23)
BUN: 12 mg/dL (ref 6–24)
Bilirubin Total: <0.2 mg/dL (ref 0.0–1.2)
CO2: 22 mmol/L (ref 20–29)
Calcium: 8.8 mg/dL (ref 8.7–10.2)
Chloride: 106 mmol/L (ref 96–106)
Creatinine, Ser: 0.94 mg/dL (ref 0.57–1.00)
Globulin, Total: 2.4 g/dL (ref 1.5–4.5)
Glucose: 101 mg/dL — ABNORMAL HIGH (ref 70–99)
Potassium: 4.5 mmol/L (ref 3.5–5.2)
Sodium: 139 mmol/L (ref 134–144)
Total Protein: 6.3 g/dL (ref 6.0–8.5)
eGFR: 79 mL/min/{1.73_m2} (ref >59)

## 2023-04-12 LAB — LIPID PANEL
Chol/HDL Ratio: 4.1 ratio (ref 0.0–4.4)
Cholesterol, Total: 155 mg/dL (ref 100–199)
HDL: 38 mg/dL — ABNORMAL LOW (ref >39)
LDL Chol Calc (NIH): 104 mg/dL — ABNORMAL HIGH (ref 0–99)
Triglycerides: 65 mg/dL (ref 0–149)
VLDL Cholesterol Cal: 13 mg/dL (ref 5–40)

## 2023-04-12 LAB — CBC
Hematocrit: 36.7 % (ref 34.0–46.6)
Hemoglobin: 11.8 g/dL (ref 11.1–15.9)
MCH: 27.4 pg (ref 26.6–33.0)
MCHC: 32.2 g/dL (ref 31.5–35.7)
MCV: 85 fL (ref 79–97)
Platelets: 396 10*3/uL (ref 150–450)
RBC: 4.31 x10E6/uL (ref 3.77–5.28)
RDW: 14.3 % (ref 11.7–15.4)
WBC: 7.7 10*3/uL (ref 3.4–10.8)

## 2023-04-12 LAB — VITAMIN D 25 HYDROXY (VIT D DEFICIENCY, FRACTURES): Vit D, 25-Hydroxy: 8.4 ng/mL — ABNORMAL LOW (ref 30.0–100.0)

## 2023-04-12 LAB — TSH RFX ON ABNORMAL TO FREE T4: TSH: 0.794 u[IU]/mL (ref 0.450–4.500)

## 2023-04-12 LAB — HEMOGLOBIN A1C
Est. average glucose Bld gHb Est-mCnc: 126 mg/dL
Hgb A1c MFr Bld: 6 % — ABNORMAL HIGH (ref 4.8–5.6)

## 2023-04-14 ENCOUNTER — Encounter: Payer: Self-pay | Admitting: Obstetrics and Gynecology

## 2023-04-14 DIAGNOSIS — R7989 Other specified abnormal findings of blood chemistry: Secondary | ICD-10-CM | POA: Insufficient documentation

## 2023-04-14 DIAGNOSIS — R7303 Prediabetes: Secondary | ICD-10-CM | POA: Insufficient documentation

## 2023-04-14 MED ORDER — VITAMIN D (ERGOCALCIFEROL) 1.25 MG (50000 UNIT) PO CAPS: 50000.0000 [IU] | ORAL_CAPSULE | ORAL | 0 refills | Status: AC

## 2023-04-14 NOTE — Addendum Note (Signed)
Addended by: Dubois Bing on: 04/14/2023 01:22 PM   Modules accepted: Orders

## 2023-04-19 ENCOUNTER — Telehealth: Payer: Self-pay | Admitting: Lactation Services

## 2023-04-19 ENCOUNTER — Ambulatory Visit: Payer: Self-pay | Admitting: Licensed Clinical Social Worker

## 2023-04-19 NOTE — Telephone Encounter (Signed)
PA request received from Pharmacy for Zofran. PA submitted, awaiting response.     Trevor Iha (Key: [id number]) PA Case ID #: [id number] Need Help? Call us at [phone] Status Sent to Plan today Drug Ondansetron 4MG  dispersible tablets ePA cloud Psychologist, educational Electronic PA Form [phone] NCPDP)

## 2023-04-19 NOTE — Telephone Encounter (Signed)
Gabriela Best (Key: [id number]) PA Case ID #: [id number] Need Help? Call us at [phone] Outcome Denied today by [facility] NCPDP 2017 Your PA request has been denied. Additional information will be provided in the denial communication. (Message 1140) Drug Ondansetron 4MG  dispersible tablets Form [employer] Electronic PA Form (2017 NCPDP)  Called and spoke with patient. She reports she no longer has nausea symptoms like she was having. She reports she is fine right now. She has tried several medications and nothing else seems to work.

## 2023-04-20 NOTE — Patient Instructions (Signed)
Visit Information  Thank you for taking time to visit with me today. Please don't hesitate to contact me if I can be of assistance to you.   Following are the goals we discussed today:   Goals Addressed             This Visit's Progress    Obtain Supportive Resources   On track    Activities and task to complete in order to accomplish goals.   Keep all upcoming appointments discussed today Continue with compliance of taking medication prescribed by Doctor Implement healthy coping skills discussed to assist with management of symptoms  Review supportive information on crisis intervention resources         Our next appointment is by telephone on 10/29 at 1 PM  Please call the care guide team at [phone] if you need to cancel or reschedule your appointment.   If you are experiencing a Mental Health or Behavioral Health Crisis or need someone to talk to, please call the Suicide and Crisis Lifeline: 988 call 911   Patient verbalizes understanding of instructions and care plan provided today and agrees to view in MyChart. Active MyChart status and patient understanding of how to access instructions and care plan via MyChart confirmed with patient.     Jenel Lucks, MSW, LCSW [facility] Management [place]  [facility] [email or web address] Phone [phone] 3:53 PM

## 2023-04-20 NOTE — Patient Outreach (Signed)
  Care Coordination   Follow Up Visit Note   04/19/2023 Name: Lynda Wanninger MRN: [id number] DOB: [birth year]  Chevella Pearce is a 42 y.o. year old female who sees Pcp, No for primary care. I spoke with  Trevor Iha by phone today.  What matters to the patients health and wellness today?  Symptom Management    Goals Addressed             This Visit's Progress    Obtain Supportive Resources   On track    Activities and task to complete in order to accomplish goals.   Keep all upcoming appointments discussed today Continue with compliance of taking medication prescribed by Doctor Implement healthy coping skills discussed to assist with management of symptoms  Review supportive information on crisis intervention resources         SDOH assessments and interventions completed:  No     Care Coordination Interventions:  Yes, provided  Interventions Today    Flowsheet Row Most Recent Value  Chronic Disease   Chronic disease during today's visit Hypertension (HTN)  General Interventions   General Interventions Discussed/Reviewed General Interventions Reviewed, Doctor Visits  [Pt has an upcoming appt to establish with a PCP. Has stable transportation to appt]  Doctor Visits Discussed/Reviewed Doctor Visits Reviewed  Mental Health Interventions   Mental Health Discussed/Reviewed Mental Health Reviewed, Coping Strategies, Anxiety, Depression  [Endorses feeling better and implementing self-care daily. Identified bandwith and is taking time for self after work, listen to music in car. Communicating better with partner and continues to establish boundaries with family/work]       Follow up plan: Follow up call scheduled for 2-4 weeks    Encounter Outcome:  Patient Visit Completed   Jenel Lucks, MSW, LCSW [facility] Care Management [facility]  [facility] [email or web address] Phone [phone] 3:52 PM

## 2023-04-28 ENCOUNTER — Ambulatory Visit (INDEPENDENT_AMBULATORY_CARE_PROVIDER_SITE_OTHER): Payer: 59 | Admitting: Family

## 2023-04-28 ENCOUNTER — Encounter: Payer: Self-pay | Admitting: Family

## 2023-04-28 VITALS — BP 138/86 | HR 76 | Temp 97.4°F | Resp 16 | Ht 63.0 in | Wt 268.4 lb

## 2023-04-28 DIAGNOSIS — R7303 Prediabetes: Secondary | ICD-10-CM | POA: Diagnosis not present

## 2023-04-28 DIAGNOSIS — E785 Hyperlipidemia, unspecified: Secondary | ICD-10-CM | POA: Diagnosis not present

## 2023-04-28 DIAGNOSIS — L309 Dermatitis, unspecified: Secondary | ICD-10-CM | POA: Diagnosis not present

## 2023-04-28 DIAGNOSIS — I10 Essential (primary) hypertension: Secondary | ICD-10-CM | POA: Diagnosis not present

## 2023-04-28 MED ORDER — TRIAMCINOLONE ACETONIDE 0.1 % EX CREA
1.0000 | TOPICAL_CREAM | Freq: Two times a day (BID) | CUTANEOUS | 0 refills | Status: DC
Start: 2023-04-28 — End: 2023-05-08

## 2023-04-28 NOTE — Progress Notes (Signed)
Provider: Richarda Blade FNP-C   Travis Mastel, Donalee Citrin, NP  Patient Care Team: Jaycie Kregel, Donalee Citrin, NP as PCP - General (Family Medicine) Bridgett Larsson, LCSW as Social Worker (Licensed Clinical Social Worker)  Extended Emergency Contact Information Primary Emergency Contact: Hopkins,Shaniqua Home Phone: [phone] Relation: Significant other  Code Status:  Full Code  Goals of care: Advanced Directive information    04/28/2023    2:41 PM  Advanced Directives  Does Patient Have a Medical Advance Directive? No     Chief Complaint  Patient presents with   Establish Care    HPI:  Pt is a 42 y.o. female seen today establish care here at [facility] and Adult  care for medical management of chronic diseases Has medical history of Fibroids managed by OB/GYN.Has been referred for Surgical evaluation 05/12/2023.states no heavy periods but usually has lots of pain.IUD removed 1-2 yrs ago.   Eczema  - has not used any medication.Has been using aloe vera lotion,coconut and Vaseline.      Past Medical History:  Diagnosis Date   Abnormal menstrual cycle    Acute perforated appendicitis 11/14/2020   Migraine    Ovarian cyst    Symptomatic cholelithiasis 11/13/2020   Past Surgical History:  Procedure Laterality Date   BREAST BIOPSY Left 01/14/2023   MM LT BREAST BX W LOC DEV 1ST LESION IMAGE BX SPEC STEREO GUIDE 01/14/2023 [facility] MAMMOGRAPHY   CHOLECYSTECTOMY N/A 11/13/2020   Procedure: LAPAROSCOPIC CHOLECYSTECTOMY WITH INTRAOPERATIVE CHOLANGIOGRAM;  Surgeon: Quentin Ore, MD;  Location: [facility] ORS;  Service: General;  Laterality: N/A;   KNEE SURGERY     KNEE SURGERY Left     No Known Allergies  Allergies as of 04/28/2023   No Known Allergies      Medication List        Accurate as of April 28, 2023  2:55 PM. If you have any questions, ask your nurse or doctor.          STOP taking these medications    acetaminophen 325 MG tablet Commonly known as:  Tylenol Stopped by: Donalee Citrin Addelynn Batte   cyclobenzaprine 10 MG tablet Commonly known as: FLEXERIL Stopped by: Donalee Citrin Caycee Wanat   doxycycline 100 MG capsule Commonly known as: VIBRAMYCIN Stopped by: Donalee Citrin Kiron Osmun   HYDROcodone-acetaminophen 5-325 MG tablet Commonly known as: NORCO/VICODIN Stopped by: Donalee Citrin Aiza Vollrath   hydrocortisone cream 1 % Stopped by: Donalee Citrin Suzanne Kho   hyoscyamine 0.125 MG SL tablet Commonly known as: Levsin/SL Stopped by: Donalee Citrin Latecia Miler   Mefenamic Acid 250 MG Caps Stopped by: Zionah Criswell C Hussein Macdougal   ondansetron 4 MG tablet Commonly known as: Zofran Stopped by: Donalee Citrin Maree Ainley   oxyCODONE 5 MG immediate release tablet Commonly known as: Oxy IR/ROXICODONE Stopped by: Donalee Citrin Ronny Korff   traMADol 50 MG tablet Commonly known as: ULTRAM Stopped by: Donalee Citrin Joanie Duprey       TAKE these medications    Vitamin D (Ergocalciferol) 1.25 MG (50000 UNIT) Caps capsule Commonly known as: DRISDOL Take 1 capsule (50,000 Units total) by mouth every 7 (seven) days for 12 doses.        Review of Systems  Constitutional:  Negative for appetite change, chills, fatigue, fever and unexpected weight change.  HENT:  Negative for congestion, dental problem, ear discharge, ear pain, facial swelling, hearing loss, nosebleeds, postnasal drip, rhinorrhea, sinus pressure, sinus pain, sneezing, sore throat, tinnitus and trouble swallowing.   Eyes:  Negative for pain, discharge, redness, itching  and visual disturbance.  Respiratory:  Negative for cough, chest tightness, shortness of breath and wheezing.   Cardiovascular:  Negative for chest pain, palpitations and leg swelling.  Gastrointestinal:  Negative for abdominal distention, abdominal pain, blood in stool, constipation, diarrhea, nausea and vomiting.  Endocrine: Negative for cold intolerance, heat intolerance, polydipsia, polyphagia and polyuria.  Genitourinary:  Negative for difficulty urinating, dysuria, flank pain, frequency  and urgency.  Musculoskeletal:  Negative for arthralgias, back pain, gait problem, joint swelling, myalgias, neck pain and neck stiffness.  Skin:  Negative for color change, pallor, rash and wound.       Eczema   Neurological:  Negative for dizziness, syncope, speech difficulty, weakness, light-headedness, numbness and headaches.  Hematological:  Does not bruise/bleed easily.  Psychiatric/Behavioral:  Negative for agitation, behavioral problems, confusion, hallucinations, self-injury, sleep disturbance and suicidal ideas. The patient is not nervous/anxious.     Immunization History  Administered Date(s) Administered   Tdap 04/27/2022   Pertinent  Health Maintenance Due  Topic Date Due   INFLUENZA VACCINE  Never done      01/16/2022    1:01 AM 02/02/2022    8:14 AM 04/26/2022   11:43 PM 04/27/2022    5:12 PM 04/28/2023    2:41 PM  Fall Risk  Falls in the past year?     0  (RETIRED) Patient Fall Risk Level Low fall risk Low fall risk Low fall risk Low fall risk    Functional Status Survey:    Vitals:   04/28/23 1443  BP: 138/86  Pulse: 76  Resp: 16  Temp: (!) 97.4 F (36.3 C)  SpO2: 98%  Weight: 268 lb 6.4 oz (121.7 kg)  Height: 5\' 3"  (1.6 m)   Body mass index is 47.54 kg/m. Physical Exam Vitals reviewed.  Constitutional:      General: She is not in acute distress.    Appearance: Normal appearance. She is morbidly obese. She is not ill-appearing or diaphoretic.  HENT:     Head: Normocephalic.     Right Ear: Tympanic membrane, ear canal and external ear normal. There is no impacted cerumen.     Left Ear: Tympanic membrane, ear canal and external ear normal. There is no impacted cerumen.     Nose: Nose normal. No congestion or rhinorrhea.     Mouth/Throat:     Mouth: Mucous membranes are moist.     Pharynx: Oropharynx is clear. No oropharyngeal exudate or posterior oropharyngeal erythema.  Eyes:     General: No scleral icterus.       Right eye: No discharge.         Left eye: No discharge.     Extraocular Movements: Extraocular movements intact.     Conjunctiva/sclera: Conjunctivae normal.     Pupils: Pupils are equal, round, and reactive to light.  Neck:     Vascular: No carotid bruit.  Cardiovascular:     Rate and Rhythm: Normal rate and regular rhythm.     Pulses: Normal pulses.     Heart sounds: Normal heart sounds. No murmur heard.    No friction rub. No gallop.  Pulmonary:     Effort: Pulmonary effort is normal. No respiratory distress.     Breath sounds: Normal breath sounds. No wheezing, rhonchi or rales.  Chest:     Chest wall: No tenderness.  Abdominal:     General: Bowel sounds are normal. There is no distension.     Palpations: Abdomen is soft. There is no mass.  Tenderness: There is no abdominal tenderness. There is no right CVA tenderness, left CVA tenderness, guarding or rebound.  Musculoskeletal:        General: No swelling or tenderness. Normal range of motion.     Cervical back: Normal range of motion. No rigidity or tenderness.     Right lower leg: No edema.     Left lower leg: No edema.  Lymphadenopathy:     Cervical: No cervical adenopathy.  Skin:    General: Skin is warm and dry.     Coloration: Skin is not pale.     Findings: No bruising, erythema, lesion or rash.  Neurological:     Mental Status: She is alert and oriented to person, place, and time.     Cranial Nerves: No cranial nerve deficit.     Sensory: No sensory deficit.     Motor: No weakness.     Coordination: Coordination normal.     Gait: Gait normal.  Psychiatric:        Mood and Affect: Mood normal.        Speech: Speech normal.        Behavior: Behavior normal.        Thought Content: Thought content normal.        Judgment: Judgment normal.     Labs reviewed: Recent Labs    12/25/22 1533 01/26/23 1935 04/11/23 1123  NA 139 139 139  K 3.9 4.2 4.5  CL 105 103 106  CO2 25 24 22   GLUCOSE 106* 95 101*  BUN 10 14 12   CREATININE 0.96  1.01* 0.94  CALCIUM 8.4* 8.9 8.8   Recent Labs    12/25/22 1533 01/26/23 1935 04/11/23 1123  AST 13* 12* 12  ALT 12 17 8   ALKPHOS 59 65 66  BILITOT 0.3 0.5 <0.2  PROT 6.3* 6.3* 6.3  ALBUMIN 3.3* 3.4* 3.9   Recent Labs    12/25/22 1533 01/26/23 1935 04/11/23 1123  WBC 8.4 8.8 7.7  NEUTROABS  --  5.2  --   HGB 10.7* 10.9* 11.8  HCT 33.8* 34.3* 36.7  MCV 85.4 86.4 85  PLT 406* 400 396   Lab Results  Component Value Date   TSH 0.794 04/11/2023   Lab Results  Component Value Date   HGBA1C 6.0 (H) 04/11/2023   Lab Results  Component Value Date   CHOL 155 04/11/2023   HDL 38 (L) 04/11/2023   LDLCALC 104 (H) 04/11/2023   TRIG 65 04/11/2023   CHOLHDL 4.1 04/11/2023    Significant Diagnostic Results in last 30 days:  US PELVIC COMPLETE WITH TRANSVAGINAL  Result Date: 04/11/2023 CLINICAL DATA:  Uterine fibroids. EXAM: TRANSABDOMINAL AND TRANSVAGINAL ULTRASOUND OF PELVIS TECHNIQUE: Both transabdominal and transvaginal ultrasound examinations of the pelvis were performed. Transabdominal technique was performed for global imaging of the pelvis including uterus, ovaries, adnexal regions, and pelvic cul-de-sac. It was necessary to proceed with endovaginal exam following the transabdominal exam to visualize the ovaries. COMPARISON:  August 15, 2021. FINDINGS: Uterus Measurements: 10.8 x 6.7 x 5.8 cm = volume: 219 mL. Two fibroids are noted in the uterine fundus, the largest measuring 6.7 cm. The other measures 3.1 cm. Endometrium Thickness: 11 mm which is within normal limits. No focal abnormality visualized. Right ovary Measurements: 3.2 x 2.6 x 2.6 cm = volume: 11 mL. Normal appearance/no adnexal mass. Left ovary Measurements: 2.6 x 2.1 x 1.6 cm = volume: 5 mL. Normal appearance/no adnexal mass. Other findings No abnormal free fluid. IMPRESSION:  Two uterine fibroids are noted, the largest measuring 6.7 cm in the fundus. Electronically Signed   By: Lupita Raider M.D.   On:  04/11/2023 14:24    Assessment/Plan 1. Prediabetes Lab Results  Component Value Date   HGBA1C 6.0 (H) 04/11/2023  -Dietary modification and exercise at least 3 times per week for 30 minutes - Hemoglobin A1c; Future  2. Primary hypertension Blood pressure well-controlled -Off medication -Continue dietary modification and exercise as above - TSH; Future - COMPLETE METABOLIC PANEL WITH GFR; Future - CBC with Differential/Platelet; Future  3. Hyperlipidemia LDL goal <100 Previous LDL 104 -Dietary modification and exercise at least 3 times per week for 30 minutes - Lipid panel; Future  4. Eczema, unspecified type Start on triamcinolone - triamcinolone cream (KENALOG) 0.1 %; Apply 1 Application topically 2 (two) times daily.  Dispense: 30 g; Refill: 0  Family/ staff Communication: Reviewed plan of care with patient verbalized understanding  Labs/tests ordered:  - TSH; Future - COMPLETE METABOLIC PANEL WITH GFR; Future - CBC with Differential/Platelet; Future - Lipid panel; Future - Hemoglobin A1c; Future  Next Appointment : Return in about 3 months (around 07/29/2023) for medical mangement of chronic issues., fasting labs prior to visit.   Caesar Bookman, NP

## 2023-04-28 NOTE — Patient Instructions (Addendum)
-  Depression way out by Brien Few  - SOS for anxiety and depression  - Telling yourself the truth

## 2023-05-04 ENCOUNTER — Telehealth: Payer: Self-pay | Admitting: Licensed Clinical Social Worker

## 2023-05-04 NOTE — Patient Outreach (Signed)
  Care Coordination   Follow Up Visit Note   05/03/2023 Name: Lucely Leard MRN: [id number] DOB: Dec 26, 1980  Oliviana Mcgahee is a 42 y.o. year old female who sees Ngetich, Donalee Citrin, NP for primary care. I spoke with  Trevor Iha by email today.  What matters to the patients health and wellness today?  Scheduling f/up appt with LCSW   SDOH assessments and interventions completed:  No     Care Coordination Interventions:  Yes, provided  Interventions Today    Flowsheet Row Most Recent Value  Chronic Disease   Chronic disease during today's visit Hypertension (HTN)  General Interventions   General Interventions Discussed/Reviewed General Interventions Reviewed  [Patient reported needing to schedule f/up appt with LCSW. LCSW provided a date and time to schedule for 10/16]  Mental Health Interventions   Mental Health Discussed/Reviewed Mental Health Discussed       Follow up plan: Follow up call scheduled for 05/04/23    Encounter Outcome:  Patient Scheduled   Jenel Lucks, MSW, LCSW [facility] Care Management [facility]  [facility] [email or web address] Phone [phone] 6:59 PM

## 2023-05-05 NOTE — Patient Instructions (Signed)
Visit Information  Thank you for taking time to visit with me today. Please don't hesitate to contact me if I can be of assistance to you.   Following are the goals we discussed today:   Goals Addressed             This Visit's Progress    Obtain Supportive Resources   On track    Activities and task to complete in order to accomplish goals.   Keep all upcoming appointments discussed today Continue with compliance of taking medication prescribed by Doctor Implement healthy coping skills discussed to assist with management of symptoms  Review supportive information on crisis intervention resources         Our next appointment is by telephone on 10/23 at 3:30 PM  Please call the care guide team at [phone] if you need to cancel or reschedule your appointment.   If you are experiencing a Mental Health or Behavioral Health Crisis or need someone to talk to, please call the Suicide and Crisis Lifeline: 988 call 911   Patient verbalizes understanding of instructions and care plan provided today and agrees to view in MyChart. Active MyChart status and patient understanding of how to access instructions and care plan via MyChart confirmed with patient.     Jenel Lucks, MSW, LCSW [facility] Care Management   [facility] [email or web address] Phone [phone] 4:30 PM

## 2023-05-05 NOTE — Patient Outreach (Signed)
  Care Coordination   Follow Up Visit Note   05/04/2023 Name: Gabriela Best MRN: [id number] DOB: 10-02-1980  Gabriela Best is a 42 y.o. year old female who sees Ngetich, Donalee Citrin, NP for primary care. I spoke with  Trevor Iha by phone today.  What matters to the patients health and wellness today?  Symptom Management and Supportive Resources    Goals Addressed             This Visit's Progress    Obtain Supportive Resources   On track    Activities and task to complete in order to accomplish goals.   Keep all upcoming appointments discussed today Continue with compliance of taking medication prescribed by Doctor Implement healthy coping skills discussed to assist with management of symptoms  Review supportive information on crisis intervention resources         SDOH assessments and interventions completed:  No     Care Coordination Interventions:  Yes, provided  Interventions Today    Flowsheet Row Most Recent Value  Chronic Disease   Chronic disease during today's visit Hypertension (HTN)  General Interventions   General Interventions Discussed/Reviewed General Interventions Reviewed, [facility], Doctor Visits  Doctor Visits Discussed/Reviewed Doctor Visits Reviewed  Mental Health Interventions   Mental Health Discussed/Reviewed Mental Health Reviewed, Coping Strategies, Anxiety, Depression, Grief and Loss  [LCSW discussed IPV cycles, healthy and unhealthy behavior in relationships. Pt successfully identified patterns/triggers that she is not wanting to repeat, discussed steps to reach personal goals that promote peace and safety]  Safety Interventions   Safety Discussed/Reviewed Safety Reviewed       Follow up plan: Follow up call scheduled for 1-2 weeks    Encounter Outcome:  Patient Visit Completed   Jenel Lucks, MSW, LCSW [facility] Care Management [facility]  [facility] [email or web address] Phone [phone] 4:29 PM

## 2023-05-08 ENCOUNTER — Encounter (HOSPITAL_COMMUNITY): Payer: Self-pay

## 2023-05-08 ENCOUNTER — Other Ambulatory Visit: Payer: Self-pay

## 2023-05-08 ENCOUNTER — Other Ambulatory Visit (HOSPITAL_COMMUNITY): Payer: 59

## 2023-05-08 ENCOUNTER — Emergency Department (HOSPITAL_COMMUNITY)
Admission: EM | Admit: 2023-05-08 | Discharge: 2023-05-08 | Disposition: A | Discharge status: Home / Self Care | Payer: 59 | Attending: Emergency Medicine | Admitting: Emergency Medicine

## 2023-05-08 ENCOUNTER — Emergency Department (HOSPITAL_COMMUNITY): Payer: 59

## 2023-05-08 DIAGNOSIS — R0989 Other specified symptoms and signs involving the circulatory and respiratory systems: Secondary | ICD-10-CM | POA: Diagnosis not present

## 2023-05-08 DIAGNOSIS — R1110 Vomiting, unspecified: Secondary | ICD-10-CM | POA: Diagnosis not present

## 2023-05-08 DIAGNOSIS — D259 Leiomyoma of uterus, unspecified: Secondary | ICD-10-CM | POA: Diagnosis not present

## 2023-05-08 DIAGNOSIS — R1032 Left lower quadrant pain: Secondary | ICD-10-CM | POA: Diagnosis not present

## 2023-05-08 LAB — COMPREHENSIVE METABOLIC PANEL
ALT: 11 U/L (ref 0–44)
AST: 13 U/L — ABNORMAL LOW (ref 15–41)
Albumin: 3.5 g/dL (ref 3.5–5.0)
Alkaline Phosphatase: 59 U/L (ref 38–126)
Anion gap: 12 (ref 5–15)
BUN: 6 mg/dL (ref 6–20)
CO2: 22 mmol/L (ref 22–32)
Calcium: 9.1 mg/dL (ref 8.9–10.3)
Chloride: 104 mmol/L (ref 98–111)
Creatinine, Ser: 0.85 mg/dL (ref 0.44–1.00)
GFR, Estimated: >60 mL/min (ref >60)
Glucose, Bld: 95 mg/dL (ref 70–99)
Potassium: 4.3 mmol/L (ref 3.5–5.1)
Sodium: 138 mmol/L (ref 135–145)
Total Bilirubin: 0.8 mg/dL (ref 0.3–1.2)
Total Protein: 6.6 g/dL (ref 6.5–8.1)

## 2023-05-08 LAB — URINALYSIS, ROUTINE W REFLEX MICROSCOPIC
Bilirubin Urine: NEGATIVE
Glucose, UA: NEGATIVE mg/dL
Ketones, ur: 20 mg/dL — AB
Nitrite: NEGATIVE
Protein, ur: 100 mg/dL — AB
RBC / HPF: >50 RBC/hpf (ref 0–5)
Specific Gravity, Urine: 1.017 (ref 1.005–1.030)
pH: 5 (ref 5.0–8.0)

## 2023-05-08 LAB — CBC
HCT: 37.5 % (ref 36.0–46.0)
Hemoglobin: 11.4 g/dL — ABNORMAL LOW (ref 12.0–15.0)
MCH: 26.6 pg (ref 26.0–34.0)
MCHC: 30.4 g/dL (ref 30.0–36.0)
MCV: 87.6 fL (ref 80.0–100.0)
Platelets: 350 10*3/uL (ref 150–400)
RBC: 4.28 MIL/uL (ref 3.87–5.11)
RDW: 16.4 % — ABNORMAL HIGH (ref 11.5–15.5)
WBC: 8.5 10*3/uL (ref 4.0–10.5)
nRBC: 0 % (ref 0.0–0.2)

## 2023-05-08 LAB — LIPASE, BLOOD: Lipase: 29 U/L (ref 11–51)

## 2023-05-08 LAB — HCG, SERUM, QUALITATIVE: Preg, Serum: NEGATIVE

## 2023-05-08 MED ORDER — ONDANSETRON 4 MG PO TBDP
4.0000 mg | ORAL_TABLET | Freq: Once | ORAL | Status: AC
  Administered 2023-05-08: 4 mg via ORAL
  Filled 2023-05-08: qty 1

## 2023-05-08 MED ORDER — KETOROLAC TROMETHAMINE 15 MG/ML IJ SOLN
15.0000 mg | Freq: Once | INTRAMUSCULAR | Status: AC
Start: 2023-05-08 — End: 2023-05-08
  Administered 2023-05-08: 15 mg via INTRAVENOUS
  Filled 2023-05-08: qty 1

## 2023-05-08 MED ORDER — NAPROXEN 500 MG PO TABS: 500.0000 mg | ORAL_TABLET | Freq: Two times a day (BID) | ORAL | 0 refills | Status: DC

## 2023-05-08 MED ORDER — ONDANSETRON HCL 4 MG/2ML IJ SOLN
4.0000 mg | Freq: Once | INTRAMUSCULAR | Status: AC
Start: 2023-05-08 — End: 2023-05-08
  Administered 2023-05-08: 4 mg via INTRAVENOUS
  Filled 2023-05-08: qty 2

## 2023-05-08 MED ORDER — ACETAMINOPHEN 325 MG PO TABS
650.0000 mg | ORAL_TABLET | Freq: Once | ORAL | Status: AC
  Administered 2023-05-08: 650 mg via ORAL
  Filled 2023-05-08: qty 2

## 2023-05-08 MED ORDER — HYDROCODONE-ACETAMINOPHEN 5-325 MG PO TABS: 1.0000 | ORAL_TABLET | Freq: Four times a day (QID) | ORAL | 0 refills | Status: DC | PRN

## 2023-05-08 NOTE — ED Triage Notes (Signed)
Patient complains of loer abdominal pain with nausea and vomiting since Thursday, complains of flank pain with same

## 2023-05-08 NOTE — ED Provider Notes (Signed)
[place] EMERGENCY DEPARTMENT AT [facility] Provider Note   CSN: [id number] Arrival date & time: 05/08/23  1013     History  No chief complaint on file.   Gabriela Best is a 41 y.o. female.  Patient is a 42 year old female with no significant past medical history presenting to the emergency department with abdominal pain and nausea.  The patient states that she started her period on Thursday and has been having left lower quadrant and left low back pain since then.  She states that she has had similar pain like this during her menstrual cycles but usually does not feel the pain in her back as well.  She states that she has had associated nausea and vomiting.  She denies any diarrhea or constipation, dysuria or hematuria or abnormal discharge.  The patient states that she has had similar episodes like this in the past but states "no one has figured out what is causing it" she states that she has been told that she has uterine fibroids.  The history is provided by the patient and medical records.       Home Medications Prior to Admission medications   Medication Sig Start Date End Date Taking? Authorizing Provider  triamcinolone cream (KENALOG) 0.1 % Apply 1 Application topically 2 (two) times daily. 04/28/23   Ngetich, Dinah C, NP  Vitamin D, Ergocalciferol, (DRISDOL) 1.25 MG (50000 UNIT) CAPS capsule Take 1 capsule (50,000 Units total) by mouth every 7 (seven) days for 12 doses. 04/14/23 07/01/23  Sonoita Bing, MD      Allergies    Lemon juice [lemon oil], Lactose intolerance (gi), and Lime oil    Review of Systems   Review of Systems  Physical Exam Updated Vital Signs BP 139/64   Pulse 68   Temp 98.3 F (36.8 C)   Resp 18   Ht 5\' 3"  (1.6 m)   Wt 121.6 kg   LMP 04/10/2023 (Exact Date)   SpO2 100%   BMI 47.47 kg/m  Physical Exam Vitals and nursing note reviewed.  Constitutional:      General: She is not in acute distress.    Appearance: Normal  appearance. She is obese.  HENT:     Head: Normocephalic and atraumatic.     Nose: Nose normal.     Mouth/Throat:     Mouth: Mucous membranes are moist.     Pharynx: Oropharynx is clear.  Eyes:     Extraocular Movements: Extraocular movements intact.     Conjunctiva/sclera: Conjunctivae normal.  Cardiovascular:     Rate and Rhythm: Normal rate and regular rhythm.     Heart sounds: Normal heart sounds.  Pulmonary:     Effort: Pulmonary effort is normal.     Breath sounds: Normal breath sounds.  Abdominal:     General: Abdomen is flat.     Palpations: Abdomen is soft.     Tenderness: There is abdominal tenderness (Mild left lower quadrant). There is no right CVA tenderness, left CVA tenderness, guarding or rebound.  Musculoskeletal:     Cervical back: Normal range of motion.     Comments: No midline back tenderness Mild left-sided lumbar paraspinal muscle tenderness to palpation  Skin:    General: Skin is warm and dry.  Neurological:     General: No focal deficit present.     Mental Status: She is alert and oriented to person, place, and time.  Psychiatric:        Mood and Affect: Mood normal.  Behavior: Behavior normal.     ED Results / Procedures / Treatments   Labs (all labs ordered are listed, but only abnormal results are displayed) Labs Reviewed  COMPREHENSIVE METABOLIC PANEL - Abnormal; Notable for the following components:      Result Value   AST 13 (*)    All other components within normal limits  CBC - Abnormal; Notable for the following components:   Hemoglobin 11.4 (*)    RDW 16.4 (*)    All other components within normal limits  URINALYSIS, ROUTINE W REFLEX MICROSCOPIC - Abnormal; Notable for the following components:   Color, Urine AMBER (*)    APPearance CLOUDY (*)    Hgb urine dipstick LARGE (*)    Ketones, ur 20 (*)    Protein, ur 100 (*)    Leukocytes,Ua TRACE (*)    Bacteria, UA RARE (*)    All other components within normal limits   LIPASE, BLOOD  HCG, SERUM, QUALITATIVE    EKG None  Radiology DG Chest 2 View  Result Date: 05/08/2023 CLINICAL DATA:  vomiting blood EXAM: CHEST - 2 VIEW COMPARISON:  None Available. FINDINGS: Low lung volume. Bilateral lung fields are clear. Bilateral costophrenic angles are clear. Normal cardio-mediastinal silhouette. No acute osseous abnormalities. The soft tissues are within normal limits. IMPRESSION: *No active cardiopulmonary disease. Electronically Signed   By: Jules Schick M.D.   On: 05/08/2023 13:35    Procedures Procedures    Medications Ordered in ED Medications  ondansetron (ZOFRAN-ODT) disintegrating tablet 4 mg (4 mg Oral Given 05/08/23 1151)  acetaminophen (TYLENOL) tablet 650 mg (650 mg Oral Given 05/08/23 1334)  ketorolac (TORADOL) 15 MG/ML injection 15 mg (15 mg Intravenous Given 05/08/23 1335)  ondansetron (ZOFRAN) injection 4 mg (4 mg Intravenous Given 05/08/23 1334)    ED Course/ Medical Decision Making/ A&P Clinical Course as of 05/08/23 1527  Sun May 08, 2023  1527 Patient signed out to Dr. Anitra Lauth pending TVUS. Plan for discharge with outpatient GYN follow up if ultrasound is negative. [VK]    Clinical Course User Index [VK] Rexford Maus, DO                                 Medical Decision Making This patient presents to the ED with chief complaint(s) of LLQ/pelvic pain, nausea with pertinent past medical history of fibroids which further complicates the presenting complaint. The complaint involves an extensive differential diagnosis and also carries with it a high risk of complications and morbidity.    The differential diagnosis includes UTI, pregnancy, ectopic, ovarian cyst, fibroids, considering ovarian torsion though less likely with several days of pain, diverticulitis or other intra-abdominal infection less likely as no point abdominal tenderness thumbs, no urinary symptoms making a pyelonephritis or nephrolithiasis less likely, no  abnormal discharge or recent STD exposures making STI less likely  Additional history obtained: Additional history obtained from N/A Records reviewed outpatient GYN records  ED Course and Reassessment: On patient's arrival she is hemodynamically stable in no acute distress.  The patient had labs performed in triage that were within normal range, had some RBCs in her urine but likely related to being on her menstrual cycle at this time, no other signs of infection.  Patient will have pelvic ultrasound performed.  She will be given Tylenol, Toradol and Zofran and will be reassessed.  Independent labs interpretation:  The following labs were independently interpreted: hematuria likely  related to being on menstrual period, otherwise within normal range  Independent visualization of imaging: - I independently visualized the following imaging with scope of interpretation limited to determining acute life threatening conditions related to emergency care: CXR, which revealed no acute disease     Amount and/or Complexity of Data Reviewed Labs: ordered. Radiology: ordered.  Risk Prescription drug management.          Final Clinical Impression(s) / ED Diagnoses Final diagnoses:  None    Rx / DC Orders ED Discharge Orders     None         Rexford Maus, DO 05/08/23 1528

## 2023-05-08 NOTE — ED Provider Triage Note (Signed)
Emergency Medicine Provider Triage Evaluation Note  Gabriela Best , a 42 y.o. female  was evaluated in triage.  Pt complains of abdominal pain since 4 days ago since menstruating. Reports reduced PO intake since then. Has been vomiting and is now vomiting bloodx1 day. Feels very faint. Hx of fibroids. Denies chest pain.   Review of Systems  Positive: Abdominal pain, N/V Negative: fevers  Physical Exam  BP (!) 147/107   Pulse 69   Temp 98.3 F (36.8 C)   Resp 18   LMP 04/10/2023 (Exact Date)   SpO2 98%  Gen:   Awake, no distress   Resp:  Normal effort  MSK:   Moves extremities without difficulty  Other:  +bilateral lower abd pain w/o guarding.  Medical Decision Making  Medically screening exam initiated at 11:29 AM.  Appropriate orders placed.  Gabriela Best was informed that the remainder of the evaluation will be completed by another provider, this initial triage assessment does not replace that evaluation, and the importance of remaining in the ED until their evaluation is complete.    Gabriela Best, [place] 05/08/23 1131

## 2023-05-08 NOTE — ED Provider Notes (Signed)
I have independently visualized and interpreted pt's images today.  Ultrasound shows evidence of a fibroid but no acute ovarian pathology.  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  Findings discussed with the patient.  At this time appears stable for discharge home.  Tolerating p.o.'s and pain has improved.    Gwyneth Sprout, MD 05/08/23 [phone]

## 2023-05-11 ENCOUNTER — Ambulatory Visit: Payer: Self-pay | Admitting: Licensed Clinical Social Worker

## 2023-05-11 ENCOUNTER — Telehealth: Payer: Self-pay | Admitting: Licensed Clinical Social Worker

## 2023-05-11 NOTE — Patient Outreach (Signed)
  Care Coordination   05/11/2023 Name: Gabriela Best MRN: [id number] DOB: April 01, 1981   Care Coordination Outreach Attempts:  An unsuccessful telephone outreach was attempted for a scheduled appointment today.  Follow Up Plan:  Additional outreach attempts will be made to offer the patient care coordination information and services.   Encounter Outcome:  No Answer   Care Coordination Interventions:  No, not indicated    Jenel Lucks, MSW, LCSW [facility] Care Management [place]  [facility] [email or web address] Phone [phone] 4:02 PM

## 2023-05-12 ENCOUNTER — Other Ambulatory Visit: Payer: Self-pay

## 2023-05-12 ENCOUNTER — Ambulatory Visit (INDEPENDENT_AMBULATORY_CARE_PROVIDER_SITE_OTHER): Payer: 59 | Admitting: Obstetrics and Gynecology

## 2023-05-12 ENCOUNTER — Encounter: Payer: Self-pay | Admitting: Obstetrics and Gynecology

## 2023-05-12 VITALS — BP 137/83 | HR 73 | Wt 269.5 lb

## 2023-05-12 DIAGNOSIS — N946 Dysmenorrhea, unspecified: Secondary | ICD-10-CM

## 2023-05-12 DIAGNOSIS — Z1330 Encounter for screening examination for mental health and behavioral disorders, unspecified: Secondary | ICD-10-CM

## 2023-05-12 DIAGNOSIS — D219 Benign neoplasm of connective and other soft tissue, unspecified: Secondary | ICD-10-CM | POA: Diagnosis not present

## 2023-05-12 DIAGNOSIS — R110 Nausea: Secondary | ICD-10-CM | POA: Diagnosis not present

## 2023-05-12 MED ORDER — NORETHINDRONE ACETATE 5 MG PO TABS: 5.0000 mg | ORAL_TABLET | Freq: Every day | ORAL | 2 refills | Status: DC

## 2023-05-12 MED ORDER — ONDANSETRON HCL 4 MG PO TABS: 4.0000 mg | ORAL_TABLET | Freq: Three times a day (TID) | ORAL | 1 refills | Status: DC | PRN

## 2023-05-12 NOTE — Patient Outreach (Signed)
  Care Coordination   Follow Up Visit Note   05/11/2023 Name: Gabriela Best MRN: [id number] DOB: September 26, 1980  Gabriela Best is a 42 y.o. year old female who sees Ngetich, Donalee Citrin, NP for primary care. I spoke with  Trevor Iha by phone today.  What matters to the patients health and wellness today?  Symptom Management    Goals Addressed             This Visit's Progress    Obtain Supportive Resources   On track    Activities and task to complete in order to accomplish goals.   Keep all upcoming appointments discussed today Continue with compliance of taking medication prescribed by Doctor Implement healthy coping skills discussed to assist with management of symptoms  Review supportive information on crisis intervention resources         SDOH assessments and interventions completed:  No     Care Coordination Interventions:  No, not indicated  Interventions Today    Flowsheet Row Most Recent Value  Chronic Disease   Chronic disease during today's visit Hypertension (HTN)  General Interventions   General Interventions Discussed/Reviewed General Interventions Reviewed, Doctor Visits  Doctor Visits Discussed/Reviewed Doctor Visits Reviewed  Mental Health Interventions   Mental Health Discussed/Reviewed Mental Health Reviewed, Coping Strategies, Anxiety, Depression  [Decrease in arguments in the last week. Discussed strategies to assist with navigating old habits and prioritizing self. Discussed aspects of co-dependent relationships and how it can negatively impact MH. Increasing socialization]       Follow up plan: Follow up call scheduled for 1-2 weeks    Encounter Outcome:  Patient Visit Completed   Jenel Lucks, MSW, LCSW [facility] Care Management [facility]  [facility] [email or web address] Phone [phone] 6:00 PM

## 2023-05-12 NOTE — Patient Instructions (Signed)
Visit Information  Thank you for taking time to visit with me today. Please don't hesitate to contact me if I can be of assistance to you.   Following are the goals we discussed today:   Goals Addressed             This Visit's Progress    Obtain Supportive Resources   On track    Activities and task to complete in order to accomplish goals.   Keep all upcoming appointments discussed today Continue with compliance of taking medication prescribed by Doctor Implement healthy coping skills discussed to assist with management of symptoms  Review supportive information on crisis intervention resources         Our next appointment is by telephone on 10/29 at 1 PM  Please call the care guide team at [phone] if you need to cancel or reschedule your appointment.   If you are experiencing a Mental Health or Behavioral Health Crisis or need someone to talk to, please call the Suicide and Crisis Lifeline: 988 call 911   Patient verbalizes understanding of instructions and care plan provided today and agrees to view in MyChart. Active MyChart status and patient understanding of how to access instructions and care plan via MyChart confirmed with patient.     Jenel Lucks, MSW, LCSW [facility] Care Management [place]  [facility] [email or web address] Phone [phone] 6:01 PM

## 2023-05-12 NOTE — Progress Notes (Signed)
GYNECOLOGY VISIT  Patient name: Gabriela Best MRN [id number]  Date of birth: [birth year] Chief Complaint:   Gynecologic Exam   History:  Gabriela Best is a 42 y.o. G1P0010 being seen today for dysmenorrhea. Discussed the use of AI scribe software for clinical note transcription with the patient, who gave verbal consent to proceed.  History of Present Illness   The patient, with a known diagnosis of uterine fibroids, presents with a longstanding history of painful menstruation. The pain, described as cramp-like, is experienced both during and between menstrual cycles. The patient reports that the pain is so severe that it leads to nausea, vomiting, and an inability to perform daily activities. The patient also noticed blood in her vomit during a recent episode of severe pain, which prompted a visit to the hospital.  The patient's menstrual cycle lasts for two to three days, and the pain is experienced throughout this duration. The patient also reports feeling unwell before the onset of her period. Previously, the patient was on birth control and had an IUD inserted to manage her periods. However, the patient found the IUD to be painful and it was subsequently removed.  The patient has also been trying to conceive but has been unsuccessful so far. She has attempted to conceive three times but has been met with disappointment each time. The patient expresses frustration and disappointment with her unsuccessful attempts at conception and is considering giving up on the idea of having a child.  She has previously used Zofran and found it to be effective in managing her nausea.          Past Medical History:  Diagnosis Date   Abnormal menstrual cycle    Acute perforated appendicitis 11/14/2020   Migraine    Ovarian cyst    Symptomatic cholelithiasis 11/13/2020    Past Surgical History:  Procedure Laterality Date   BREAST BIOPSY Left 01/14/2023   MM LT BREAST BX W LOC DEV 1ST LESION  IMAGE BX SPEC STEREO GUIDE 01/14/2023 [facility]   CHOLECYSTECTOMY N/A 11/13/2020   Procedure: LAPAROSCOPIC CHOLECYSTECTOMY WITH INTRAOPERATIVE CHOLANGIOGRAM;  Surgeon: Quentin Ore, MD;  Location: [facility];  Service: General;  Laterality: N/A;   KNEE SURGERY     KNEE SURGERY Left     The following portions of the patient's history were reviewed and updated as appropriate: allergies, current medications, past family history, past medical history, past social history, past surgical history and problem list.   Health Maintenance:   Last pap     Component Value Date/Time   DIAGPAP  03/25/2023 0931    - Negative for intraepithelial lesion or malignancy (NILM)   HPVHIGH Negative 03/25/2023 0931   ADEQPAP  03/25/2023 0931    Satisfactory for evaluation; transformation zone component PRESENT.    High Risk HPV: Positive  Adequacy:  Satisfactory for evaluation, transformation zone component PRESENT  Diagnosis:  Atypical squamous cells of undetermined significance (ASC-US)  Last mammogram: 12/2022 [facility] 4, Diagnosis Breast, left, needle core biopsy PSEUDOANGIOMATOUS STROMAL HYPERPLASIA (PASH). NEGATIVE FOR MALIGNANCY.   Review of Systems:  Pertinent items are noted in HPI. Comprehensive review of systems was otherwise negative.   Objective:  Physical Exam BP 137/83   Pulse 73   Wt 269 lb 8 oz (122.2 kg)   BMI 47.74 kg/m    Physical Exam Vitals and nursing note reviewed.  Constitutional:      Appearance: Normal appearance.  HENT:     Head: Normocephalic and atraumatic.  Pulmonary:  Effort: Pulmonary effort is normal.  Skin:    General: Skin is warm and dry.  Neurological:     General: No focal deficit present.     Mental Status: She is alert.  Psychiatric:        Mood and Affect: Mood normal.        Behavior: Behavior normal.        Thought Content: Thought content normal.        Judgment: Judgment normal.      Labs and Imaging US Pelvis  Complete  Result Date: 05/08/2023 CLINICAL DATA:  LLQ pelvic pain EXAM: TRANSABDOMINAL AND TRANSVAGINAL ULTRASOUND OF PELVIS DOPPLER ULTRASOUND OF OVARIES TECHNIQUE: Both transabdominal and transvaginal ultrasound examinations of the pelvis were performed. Transabdominal technique was performed for global imaging of the pelvis including uterus, ovaries, adnexal regions, and pelvic cul-de-sac. It was necessary to proceed with endovaginal exam following the transabdominal exam to visualize the endometrium. Color and duplex Doppler ultrasound was utilized to evaluate blood flow to the ovaries. COMPARISON:  None Available. FINDINGS: Uterus Measurements: 4.3 x 6.5 x 8.9 cm. = volume: 159.1 mL. There is a pedunculated subserosal leiomyoma in the fundal region measuring 3.2 x 3.6 x 4.0 cm. Endometrium Thickness: Measuring up to 8-10 mm. Not well evaluated on the current exam. No discrete focal mass seen on the provided images. Right ovary Measurements: 1.6 x 3.4 x 3.4 cm. Normal appearance/no adnexal mass. Left ovary Measurements: 1.7 x 2.2 x 3.1 cm. Normal appearance/no adnexal mass. Pulsed Doppler evaluation of both ovaries demonstrates normal low-resistance arterial and venous waveforms. Other findings No abnormal free fluid. IMPRESSION: 1. No acute abnormality identified. 2. There is a 4.0 cm pedunculated subserosal leiomyoma in the fundal region of the uterus. Electronically Signed   By: Jules Schick M.D.   On: 05/08/2023 16:10   US Transvaginal Non-OB  Result Date: 05/08/2023 CLINICAL DATA:  LLQ pelvic pain EXAM: TRANSABDOMINAL AND TRANSVAGINAL ULTRASOUND OF PELVIS DOPPLER ULTRASOUND OF OVARIES TECHNIQUE: Both transabdominal and transvaginal ultrasound examinations of the pelvis were performed. Transabdominal technique was performed for global imaging of the pelvis including uterus, ovaries, adnexal regions, and pelvic cul-de-sac. It was necessary to proceed with endovaginal exam following the  transabdominal exam to visualize the endometrium. Color and duplex Doppler ultrasound was utilized to evaluate blood flow to the ovaries. COMPARISON:  None Available. FINDINGS: Uterus Measurements: 4.3 x 6.5 x 8.9 cm. = volume: 159.1 mL. There is a pedunculated subserosal leiomyoma in the fundal region measuring 3.2 x 3.6 x 4.0 cm. Endometrium Thickness: Measuring up to 8-10 mm. Not well evaluated on the current exam. No discrete focal mass seen on the provided images. Right ovary Measurements: 1.6 x 3.4 x 3.4 cm. Normal appearance/no adnexal mass. Left ovary Measurements: 1.7 x 2.2 x 3.1 cm. Normal appearance/no adnexal mass. Pulsed Doppler evaluation of both ovaries demonstrates normal low-resistance arterial and venous waveforms. Other findings No abnormal free fluid. IMPRESSION: 1. No acute abnormality identified. 2. There is a 4.0 cm pedunculated subserosal leiomyoma in the fundal region of the uterus. Electronically Signed   By: Jules Schick M.D.   On: 05/08/2023 16:10   [place] Art/Ven Flow Abd Pelv Doppler  Result Date: 05/08/2023 CLINICAL DATA:  LLQ pelvic pain EXAM: TRANSABDOMINAL AND TRANSVAGINAL ULTRASOUND OF PELVIS DOPPLER ULTRASOUND OF OVARIES TECHNIQUE: Both transabdominal and transvaginal ultrasound examinations of the pelvis were performed. Transabdominal technique was performed for global imaging of the pelvis including uterus, ovaries, adnexal regions, and pelvic cul-de-sac. It was necessary  to proceed with endovaginal exam following the transabdominal exam to visualize the endometrium. Color and duplex Doppler ultrasound was utilized to evaluate blood flow to the ovaries. COMPARISON:  None Available. FINDINGS: Uterus Measurements: 4.3 x 6.5 x 8.9 cm. = volume: 159.1 mL. There is a pedunculated subserosal leiomyoma in the fundal region measuring 3.2 x 3.6 x 4.0 cm. Endometrium Thickness: Measuring up to 8-10 mm. Not well evaluated on the current exam. No discrete focal mass seen on the provided  images. Right ovary Measurements: 1.6 x 3.4 x 3.4 cm. Normal appearance/no adnexal mass. Left ovary Measurements: 1.7 x 2.2 x 3.1 cm. Normal appearance/no adnexal mass. Pulsed Doppler evaluation of both ovaries demonstrates normal low-resistance arterial and venous waveforms. Other findings No abnormal free fluid. IMPRESSION: 1. No acute abnormality identified. 2. There is a 4.0 cm pedunculated subserosal leiomyoma in the fundal region of the uterus. Electronically Signed   By: Jules Schick M.D.   On: 05/08/2023 16:10   DG Chest 2 View  Result Date: 05/08/2023 CLINICAL DATA:  vomiting blood EXAM: CHEST - 2 VIEW COMPARISON:  None Available. FINDINGS: Low lung volume. Bilateral lung fields are clear. Bilateral costophrenic angles are clear. Normal cardio-mediastinal silhouette. No acute osseous abnormalities. The soft tissues are within normal limits. IMPRESSION: *No active cardiopulmonary disease. Electronically Signed   By: Jules Schick M.D.   On: 05/08/2023 13:35       Assessment & Plan:   1. Dysmenorrhea Assessment and Plan    Uterine Fibroids   She experiences chronic pelvic pain, dysmenorrhea, and nausea, with a history of treatment using an IUD. The correlation between her fibroids and symptoms remains unclear due to inconsistent ultrasound findings. We will start Aygestin (norethindrone) to decrease bleeding and order an MRI to better delineate the fibroid's location and size.  Nausea with Menses   She suffers from severe nausea during menses, leading to vomiting and an inability to eat or drink. We will prescribe Zofran as needed for nausea.  Fertility Considerations   She has expressed ambivalence about future pregnancy. We discussed the potential impact of various treatments on fertility and will continue this discussion after the MRI results to determine the best course of action.      - norethindrone (AYGESTIN) 5 MG tablet; Take 1 tablet (5 mg total) by mouth daily.  Dispense:  30 tablet; Refill: 2 - ondansetron (ZOFRAN) 4 MG tablet; Take 1 tablet (4 mg total) by mouth every 8 (eight) hours as needed for nausea or vomiting.  Dispense: 20 tablet; Refill: 1 - MR PELVIS W WO CONTRAST; Future   Routine preventative health maintenance measures emphasized.  Lorriane Shire, MD [facility] In[facility], [facility]

## 2023-05-17 ENCOUNTER — Ambulatory Visit: Payer: Self-pay | Admitting: Licensed Clinical Social Worker

## 2023-05-18 ENCOUNTER — Encounter: Payer: 59 | Admitting: Licensed Clinical Social Worker

## 2023-05-18 NOTE — Patient Outreach (Signed)
  Care Coordination   Follow Up Visit Note   05/17/2023 Name: Gabriela Best MRN: [id number] DOB: 1980/12/13  Gabriela Best is a 42 y.o. year old female who sees Ngetich, Donalee Citrin, NP for primary care. I spoke with  Gabriela Best by phone today.  What matters to the patients health and wellness today?  Symptom Management    Goals Addressed             This Visit's Progress    Obtain Supportive Resources   On track    Activities and task to complete in order to accomplish goals.   Keep all upcoming appointments discussed today Continue with compliance of taking medication prescribed by Doctor Implement healthy coping skills discussed to assist with management of symptoms  Review supportive information on crisis intervention resources         SDOH assessments and interventions completed:  No     Care Coordination Interventions:  Yes, provided  Interventions Today    Flowsheet Row Most Recent Value  Chronic Disease   Chronic disease during today's visit Hypertension (HTN)  General Interventions   General Interventions Discussed/Reviewed General Interventions Reviewed, Doctor Visits  [Patient has transportation to upcoming MRI appt. Endorses ongoing pain, discussed strategies to assist with management]  Doctor Visits Discussed/Reviewed Doctor Visits Reviewed  Mental Health Interventions   Mental Health Discussed/Reviewed Mental Health Reviewed, Coping Strategies, Anxiety, Depression  [Strategies to assist with anxiety discussed. Discussed fair fighting tips to improve communication. LCSW commended pt for implementing boundaries with those close with her. Pt has identified personal goals to assist with prioritizing herself and family]  Safety Interventions   Safety Discussed/Reviewed Safety Reviewed       Follow up plan: Follow up call scheduled for 2 weeks    Encounter Outcome:  Patient Visit Completed   Jenel Lucks, MSW, LCSW [facility] Care Management [facility]   [facility] [email or web address] Phone [phone] 12:50 PM

## 2023-05-18 NOTE — Patient Instructions (Signed)
Visit Information  Thank you for taking time to visit with me today. Please don't hesitate to contact me if I can be of assistance to you.   Following are the goals we discussed today:   Goals Addressed             This Visit's Progress    Obtain Supportive Resources   On track    Activities and task to complete in order to accomplish goals.   Keep all upcoming appointments discussed today Continue with compliance of taking medication prescribed by Doctor Implement healthy coping skills discussed to assist with management of symptoms  Review supportive information on crisis intervention resources         Our next appointment is by telephone on 11/12 at 1 PM  Please call the care guide team at [phone] if you need to cancel or reschedule your appointment.   If you are experiencing a Mental Health or Behavioral Health Crisis or need someone to talk to, please call the Suicide and Crisis Lifeline: 988 call 911   Patient verbalizes understanding of instructions and care plan provided today and agrees to view in MyChart. Active MyChart status and patient understanding of how to access instructions and care plan via MyChart confirmed with patient.     Jenel Lucks, MSW, LCSW [facility] Care Management [place]  [facility] [email or web address] Phone [phone] 12:51 PM

## 2023-05-20 ENCOUNTER — Ambulatory Visit (HOSPITAL_COMMUNITY)
Admission: RE | Admit: 2023-05-20 | Discharge: 2023-05-20 | Disposition: A | Discharge status: Home / Self Care | Payer: 59 | Source: Ambulatory Visit | Attending: Obstetrics and Gynecology | Admitting: Obstetrics and Gynecology

## 2023-05-20 DIAGNOSIS — K5730 Diverticulosis of large intestine without perforation or abscess without bleeding: Secondary | ICD-10-CM | POA: Diagnosis not present

## 2023-05-20 DIAGNOSIS — N946 Dysmenorrhea, unspecified: Secondary | ICD-10-CM | POA: Diagnosis not present

## 2023-05-20 MED ORDER — GADOBUTROL 1 MMOL/ML IV SOLN
10.0000 mL | Freq: Once | INTRAVENOUS | Status: AC | PRN
  Administered 2023-05-20: 10 mL via INTRAVENOUS

## 2023-05-21 DIAGNOSIS — Z8249 Family history of ischemic heart disease and other diseases of the circulatory system: Secondary | ICD-10-CM | POA: Diagnosis not present

## 2023-05-21 DIAGNOSIS — E559 Vitamin D deficiency, unspecified: Secondary | ICD-10-CM | POA: Diagnosis not present

## 2023-05-21 DIAGNOSIS — F324 Major depressive disorder, single episode, in partial remission: Secondary | ICD-10-CM | POA: Diagnosis not present

## 2023-05-21 DIAGNOSIS — Z720 Tobacco use: Secondary | ICD-10-CM | POA: Diagnosis not present

## 2023-05-21 DIAGNOSIS — E785 Hyperlipidemia, unspecified: Secondary | ICD-10-CM | POA: Diagnosis not present

## 2023-05-21 DIAGNOSIS — Z809 Family history of malignant neoplasm, unspecified: Secondary | ICD-10-CM | POA: Diagnosis not present

## 2023-05-21 DIAGNOSIS — L309 Dermatitis, unspecified: Secondary | ICD-10-CM | POA: Diagnosis not present

## 2023-05-21 DIAGNOSIS — N182 Chronic kidney disease, stage 2 (mild): Secondary | ICD-10-CM | POA: Diagnosis not present

## 2023-05-21 DIAGNOSIS — Z833 Family history of diabetes mellitus: Secondary | ICD-10-CM | POA: Diagnosis not present

## 2023-05-21 DIAGNOSIS — G8929 Other chronic pain: Secondary | ICD-10-CM | POA: Diagnosis not present

## 2023-05-21 DIAGNOSIS — R7303 Prediabetes: Secondary | ICD-10-CM | POA: Diagnosis not present

## 2023-05-31 ENCOUNTER — Ambulatory Visit: Payer: Self-pay | Admitting: Licensed Clinical Social Worker

## 2023-06-03 NOTE — Patient Instructions (Signed)
Visit Information  Thank you for taking time to visit with me today. Please don't hesitate to contact me if I can be of assistance to you.   Following are the goals we discussed today:   Goals Addressed             This Visit's Progress    Obtain Supportive Resources   On track    Activities and task to complete in order to accomplish goals.   Keep all upcoming appointments discussed today Continue with compliance of taking medication prescribed by Doctor Implement healthy coping skills discussed to assist with management of symptoms  Review supportive information on crisis intervention resources         Our next appointment is by telephone on 12/13 at 1 PM  Please call the care guide team at [phone] if you need to cancel or reschedule your appointment.   If you are experiencing a Mental Health or Behavioral Health Crisis or need someone to talk to, please call the Suicide and Crisis Lifeline: 988 call 911   Patient verbalizes understanding of instructions and care plan provided today and agrees to view in MyChart. Active MyChart status and patient understanding of how to access instructions and care plan via MyChart confirmed with patient.     Jenel Lucks, MSW, LCSW [facility] Care Management [place]  [facility] [email or web address] Phone [phone] 5:50 AM

## 2023-06-03 NOTE — Patient Outreach (Signed)
  Care Coordination   Follow Up Visit Note   05/31/2023 Name: Gabriela Best MRN: [id number] DOB: Oct 10, 1980  Gabriela Best is a 42 y.o. year old female who sees Ngetich, Donalee Citrin, NP for primary care. I spoke with  Trevor Iha by phone today.  What matters to the patients health and wellness today?  Symptom Management    Goals Addressed             This Visit's Progress    Obtain Supportive Resources   On track    Activities and task to complete in order to accomplish goals.   Keep all upcoming appointments discussed today Continue with compliance of taking medication prescribed by Doctor Implement healthy coping skills discussed to assist with management of symptoms  Review supportive information on crisis intervention resources         SDOH assessments and interventions completed:  No     Care Coordination Interventions:  Yes, provided  Interventions Today    Flowsheet Row Most Recent Value  Chronic Disease   Chronic disease during today's visit Hypertension (HTN)  General Interventions   General Interventions Discussed/Reviewed General Interventions Reviewed, Doctor Visits  Doctor Visits Discussed/Reviewed Doctor Visits Reviewed  Mental Health Interventions   Mental Health Discussed/Reviewed Mental Health Reviewed, Coping Strategies, Anxiety, Depression  Pharmacy Interventions   Pharmacy Dicussed/Reviewed Pharmacy Topics Reviewed, Medication Adherence  Safety Interventions   Safety Discussed/Reviewed Safety Reviewed       Follow up plan: Follow up call scheduled for 2-4 weeks    Encounter Outcome:  Patient Visit Completed   Jenel Lucks, MSW, LCSW [facility] Care Management [facility]  [facility] [email or web address] Phone [phone] 5:50 AM

## 2023-06-04 ENCOUNTER — Telehealth: Payer: 59 | Admitting: Family Medicine

## 2023-06-04 DIAGNOSIS — R112 Nausea with vomiting, unspecified: Secondary | ICD-10-CM | POA: Diagnosis not present

## 2023-06-04 DIAGNOSIS — R059 Cough, unspecified: Secondary | ICD-10-CM

## 2023-06-04 DIAGNOSIS — R040 Epistaxis: Secondary | ICD-10-CM

## 2023-06-04 MED ORDER — BENZONATATE 100 MG PO CAPS: 100.0000 mg | ORAL_CAPSULE | Freq: Three times a day (TID) | ORAL | 0 refills | Status: AC

## 2023-06-04 MED ORDER — OXYMETAZOLINE HCL 0.05 % NA SOLN: 1.0000 | Freq: Two times a day (BID) | NASAL | 0 refills | Status: AC

## 2023-06-04 MED ORDER — PREDNISONE 10 MG (21) PO TBPK: ORAL_TABLET | ORAL | 0 refills | Status: DC

## 2023-06-04 MED ORDER — DM-GUAIFENESIN ER 30-600 MG PO TB12: 1.0000 | ORAL_TABLET | Freq: Two times a day (BID) | ORAL | 0 refills | Status: AC

## 2023-06-04 NOTE — Patient Instructions (Signed)
Trevor Iha, thank you for joining Reed Pandy, PA-C for today's virtual visit.  While this provider is not your primary care provider (PCP), if your PCP is located in our provider database this encounter information will be shared with them immediately following your visit.   A [facility] MyChart account gives you access to today's visit and all your visits, tests, and labs performed at [facility] " click here if you don't have a [facility] MyChart account or go to [email or web address]  Consent: (Patient) Gabriela Best provided verbal consent for this virtual visit at the beginning of the encounter.  Current Medications:  Current Outpatient Medications:    benzonatate (TESSALON) 100 MG capsule, Take 1 capsule (100 mg total) by mouth 3 (three) times daily for 7 days., Disp: 21 capsule, Rfl: 0   dextromethorphan-guaiFENesin (MUCINEX DM) 30-600 MG 12hr tablet, Take 1 tablet by mouth 2 (two) times daily for 7 days., Disp: 14 tablet, Rfl: 0   oxymetazoline (AFRIN NASAL SPRAY) 0.05 % nasal spray, Place 1 spray into both nostrils 2 (two) times daily for 5 days., Disp: 1 mL, Rfl: 0   predniSONE (STERAPRED UNI-PAK 21 TAB) 10 MG (21) TBPK tablet, Take following package directions., Disp: 21 tablet, Rfl: 0   HYDROcodone-acetaminophen (NORCO/VICODIN) 5-325 MG tablet, Take 1 tablet by mouth every 6 (six) hours as needed for severe pain (pain score 7-10)., Disp: 6 tablet, Rfl: 0   naproxen (NAPROSYN) 500 MG tablet, Take 1 tablet (500 mg total) by mouth 2 (two) times daily., Disp: 20 tablet, Rfl: 0   norethindrone (AYGESTIN) 5 MG tablet, Take 1 tablet (5 mg total) by mouth daily., Disp: 30 tablet, Rfl: 2   ondansetron (ZOFRAN) 4 MG tablet, Take 1 tablet (4 mg total) by mouth every 8 (eight) hours as needed for nausea or vomiting., Disp: 20 tablet, Rfl: 1   Vitamin D, Ergocalciferol, (DRISDOL) 1.25 MG (50000 UNIT) CAPS capsule, Take 1 capsule (50,000 Units total) by mouth every 7 (seven)  days for 12 doses., Disp: 12 capsule, Rfl: 0   Medications ordered in this encounter:  Meds ordered this encounter  Medications   oxymetazoline (AFRIN NASAL SPRAY) 0.05 % nasal spray    Sig: Place 1 spray into both nostrils 2 (two) times daily for 5 days.    Dispense:  1 mL    Refill:  0   predniSONE (STERAPRED UNI-PAK 21 TAB) 10 MG (21) TBPK tablet    Sig: Take following package directions.    Dispense:  21 tablet    Refill:  0    Please dispense one standard blister pack taper.   dextromethorphan-guaiFENesin (MUCINEX DM) 30-600 MG 12hr tablet    Sig: Take 1 tablet by mouth 2 (two) times daily for 7 days.    Dispense:  14 tablet    Refill:  0   benzonatate (TESSALON) 100 MG capsule    Sig: Take 1 capsule (100 mg total) by mouth 3 (three) times daily for 7 days.    Dispense:  21 capsule    Refill:  0     *If you need refills on other medications prior to your next appointment, please contact your pharmacy*  Follow-Up: Call back or seek an in-person evaluation if the symptoms worsen or if the condition fails to improve as anticipated.  [facility] [phone]  Other Instructions Nosebleed, Adult A nosebleed is when blood comes out of the nose. Nosebleeds are common. Usually, they are not a sign of a serious  condition. Nosebleeds can happen if a blood vessel in your nose starts to bleed or if the lining of your nose (mucous membrane) cracks. They are commonly caused by: Allergies. Colds. Picking your nose. Blowing your nose too hard. An injury from sticking an object into your nose or getting hit in the nose. Dry or cold air. Less common causes of nosebleeds include: Toxic fumes. Something abnormal in the nose or in the air-filled spaces in the bones of the face (sinuses). Growths in the nose, such as polyps. Blood thinners or conditions that cause blood to clot slowly. Certain illnesses or procedures that irritate or dry out the nasal passages. Follow  these instructions at home: When you have a nosebleed:  Sit down and tilt your head slightly forward. Use a clean towel or tissue to pinch your nostrils under the bony part of your nose. After 5 minutes, let go of your nose and see if bleeding starts again. Do not release pressure before that time. If there is still bleeding, repeat the pinching and holding for 5 minutes or until the bleeding stops. Do not place tissues or gauze in the nose to stop the bleeding. Avoid lying down and avoid tilting your head backward. That may make blood collect in the throat and cause gagging or coughing. Use a nasal spray decongestant to help with a nosebleed as told by your health care provider. After a nosebleed: Avoid blowing your nose or sniffing for a number of hours. Avoid straining, lifting, or bending at the waist for several days. You may go back to other normal activities as you are able. If you are taking aspirin or blood thinners and you have nosebleeds, talk to your health care provider. These medicines make bleeding more likely. Ask your health care provider if you should stop taking the medicines or if you should adjust the dose. Do not stop taking medicines that your health care provider has recommended unless he or she tells you to stop taking them. If your nosebleed was caused by dry mucous membranes, use over-the-counter saline nasal spray or gel and a humidifier as told by your health care provider. This will keep the mucous membranes moist and allow them to heal. If you need to use one of these products: Choose one that is water-soluble. Use only as much as you need and use it only as often as needed. Do not lie down right after you use it. If you get nosebleeds often, talk with your health care provider about medical treatments. Options may include: Nasal cautery. This treatment stops and prevents nosebleeds by using a chemical swab or electrical device to lightly burn tiny blood vessels  inside the nose. Nasal packing. A gauze or other material is placed in the nose to keep constant pressure on the bleeding area. Contact a health care provider if you: Have a fever. Get nosebleeds often or more often than usual. Bruise very easily. Have a nosebleed from having something stuck in your nose. Have bleeding in your mouth. Vomit or cough up brown material. Have a nosebleed after you start a new medicine. Get help right away if: You have a nosebleed after a fall or a head injury. Your nosebleed does not go away after 20 minutes. You feel dizzy or weak. You have unusual bleeding from other parts of your body. You have unusual bruising on other parts of your body. You become sweaty. You vomit blood. Summary A nosebleed is when blood comes out of the nose. Common  causes include allergies, an injury to the nose, or cold or dry air. Initial treatment includes applying pressure for 5 minutes. Moisturizing the nose with saline nasal spray or gel after a nosebleed may help prevent future bleeding. Get help right away if your nosebleed does not go away after 20 minutes. This information is not intended to replace advice given to you by your health care provider. Make sure you discuss any questions you have with your health care provider. Document Revised: 05/03/2019 Document Reviewed: 05/03/2019 Elsevier Patient Education  2022 Elsevier Inc.  Cough, Adult Coughing is a reflex that clears your throat and airways (respiratory system). It helps heal and protect your lungs. It is normal to cough from time to time. A cough that happens with other symptoms or that lasts a long time may be a sign of a condition that needs treatment. A short-term (acute) cough may only last 2-3 weeks. A long-term (chronic) cough may last 8 or more weeks. Coughing is often caused by: Diseases, such as: An infection of the respiratory system. Asthma or other heart or lung diseases. Gastroesophageal reflux.  This is when acid comes back up from the stomach. Breathing in things that irritate your lungs. Allergies. Postnasal drip. This is when mucus runs down the back of your throat. Smoking. Some medicines. Follow these instructions at home: Medicines Take over-the-counter and prescription medicines only as told by your health care provider. Talk with your provider before you take cough medicine (cough suppressants). Eating and drinking Do not drink alcohol. Avoid caffeine. Drink enough fluid to keep your pee (urine) pale yellow. Lifestyle Avoid cigarette smoke. Do not use any products that contain nicotine or tobacco. These products include cigarettes, chewing tobacco, and vaping devices, such as e-cigarettes. If you need help quitting, ask your provider. Avoid things that make you cough. These may include perfumes, candles, cleaning products, or campfire smoke. General instructions  Watch for any changes to your cough. Tell your provider about them. Always cover your mouth when you cough. If the air is dry in your bedroom or home, use a cool mist vaporizer or humidifier. If your cough is worse at night, try to sleep in a semi-upright position. Rest as needed. Contact a health care provider if: You have new symptoms, or your symptoms get worse. You cough up pus. You have a fever that does not go away or a cough that does not get better after 2-3 weeks. You cannot control your cough with medicine, and you are losing sleep. You have pain that gets worse or is not helped with medicine. You lose weight for no clear reason. You have night sweats. Get help right away if: You cough up blood. You have trouble breathing. Your heart is beating very fast. These symptoms may be an emergency. Get help right away. Call 911. Do not wait to see if the symptoms will go away. Do not drive yourself to the hospital. This information is not intended to replace advice given to you by your health care  provider. Make sure you discuss any questions you have with your health care provider. Document Revised: 03/05/2022 Document Reviewed: 03/05/2022 Elsevier Patient Education  2024 Elsevier Inc.  Nausea and Vomiting, Adult Nausea is the feeling that you have an upset stomach or that you are about to vomit. As nausea gets worse, it can lead to vomiting. Vomiting is when stomach contents forcefully come out of your mouth as a result of nausea. Vomiting can make you feel weak and  cause you to become dehydrated. Dehydration can make you feel tired and thirsty, cause you to have a dry mouth, and decrease how often you urinate. Older adults and people with other diseases or a weak disease-fighting system (immune system) are at higher risk for dehydration. It is important to treat your nausea and vomiting as told by your health care provider. Follow these instructions at home: Watch your symptoms for any changes. Tell your health care provider about them. Eating and drinking     Take an oral rehydration solution (ORS). This is a drink that is sold at pharmacies and retail stores. Drink clear fluids slowly and in small amounts as you are able. Clear fluids include water, ice chips, low-calorie sports drinks, and fruit juice that has water added (diluted fruit juice). Eat bland, easy-to-digest foods in small amounts as you are able. These foods include bananas, applesauce, rice, lean meats, toast, and crackers. Avoid fluids that contain a lot of sugar or caffeine, such as energy drinks, sports drinks, and soda. Avoid alcohol. Avoid spicy or fatty foods. General instructions Take over-the-counter and prescription medicines only as told by your health care provider. Drink enough fluid to keep your urine pale yellow. Wash your hands often using soap and water for at least 20 seconds. If soap and water are not available, use hand sanitizer. Make sure that everyone in your household washes their hands well  and often. Rest at home while you recover. Watch your condition for any changes. Take slow and deep breaths when you feel nauseous. Keep all follow-up visits. This is important. Contact a health care provider if: Your symptoms get worse. You have new symptoms. You have a fever. You cannot drink fluids without vomiting. Your nausea does not go away after 2 days. You feel light-headed or dizzy. You have a headache. You have muscle cramps. You have a rash. You have pain while urinating. Get help right away if: You have pain in your chest, neck, arm, or jaw. You feel extremely weak or you faint. You have persistent vomiting. You have vomit that is bright red or looks like black coffee grounds. You have bloody or black stools (feces) or stools that look like tar. You have a severe headache, a stiff neck, or both. You have severe pain, cramping, or bloating in your abdomen. You have difficulty breathing, or you are breathing very quickly. Your heart is beating very quickly. Your skin feels cold and clammy. You feel confused. You have signs of dehydration, such as: Dark urine, very little urine, or no urine. Cracked lips. Dry mouth. Sunken eyes. Sleepiness. Weakness. These symptoms may be an emergency. Get help right away. Call 911. Do not wait to see if the symptoms will go away. Do not drive yourself to the hospital. Summary Nausea is the feeling that you have an upset stomach or that you are about to vomit. As nausea gets worse, it can lead to vomiting. Vomiting can make you feel weak and cause you to become dehydrated. Follow instructions from your health care provider about eating and drinking to prevent dehydration. Take over-the-counter and prescription medicines only as told by your health care provider. Contact your health care provider if your symptoms get worse, or you have new symptoms. Keep all follow-up visits. This is important. This information is not intended to  replace advice given to you by your health care provider. Make sure you discuss any questions you have with your health care provider. Document Revised: 01/09/2021 Document Reviewed:  01/09/2021 Elsevier Patient Education  2024 Elsevier Inc.   If you have been instructed to have an in-person evaluation today at a local Urgent Care facility, please use the link below. It will take you to a list of all of our available [facility], including address, phone number and hours of operation. Please do not delay care.  [facility]  If you or a family member do not have a primary care provider, use the link below to schedule a visit and establish care. When you choose a [facility] primary care physician or advanced practice provider, you gain a long-term partner in health. Find a Primary Care Provider  Learn more about [facility] in-office and virtual care options: [facility] Get Care Now

## 2023-06-04 NOTE — Progress Notes (Signed)
Virtual Visit Consent   Gabriela Best, you are scheduled for a virtual visit with a [facility] provider today. Just as with appointments in the office, your consent must be obtained to participate. Your consent will be active for this visit and any virtual visit you may have with one of our providers in the next 365 days. If you have a MyChart account, a copy of this consent can be sent to you electronically.  As this is a virtual visit, video technology does not allow for your provider to perform a traditional examination. This may limit your provider's ability to fully assess your condition. If your provider identifies any concerns that need to be evaluated in person or the need to arrange testing (such as labs, EKG, etc.), we will make arrangements to do so. Although advances in technology are sophisticated, we cannot ensure that it will always work on either your end or our end. If the connection with a video visit is poor, the visit may have to be switched to a telephone visit. With either a video or telephone visit, we are not always able to ensure that we have a secure connection.  By engaging in this virtual visit, you consent to the provision of healthcare and authorize for your insurance to be billed (if applicable) for the services provided during this visit. Depending on your insurance coverage, you may receive a charge related to this service.  I need to obtain your verbal consent now. Are you willing to proceed with your visit today? Edgar Giove has provided verbal consent on 06/04/2023 for a virtual visit (video or telephone). Gabriela Best, [place]  Date: 06/04/2023 3:15 PM  Virtual Visit via Video Note   I, Gabriela Best, connected with  Gabriela Best  ([id number], 12-Jun-1981) on 06/04/23 at  3:15 PM EST by a video-enabled telemedicine application and verified that I am speaking with the correct person using two identifiers.  Location: Patient: Virtual Visit Location Patient:  Home Provider: Virtual Visit Location Provider: Home Office   I discussed the limitations of evaluation and management by telemedicine and the availability of in person appointments. The patient expressed understanding and agreed to proceed.    History of Present Illness: Gabriela Best is a 42 y.o. who identifies as a female who was assigned female at birth, and is being seen today for c/o She has a cold.  Pt states she has been coughing up blood for a couple of days.  Pt states taking nyquil.  Pt states she has wheezing and her chest hurts with coughing.  Pt states one of her kids came home with a cold.  Pt states yesterday when she was at work, her nose began to bleed.  Pt states she knows when she is about to have a nose bleed because her nose becomes itchy. Pt states she puts a cold rag on her nose to help with the bleeding. Pt states when coughing she has mucus come up sometimes and noticed one occasion with some blood in it.  Pt reports chills and has nausea but is taking pills to prevent the vomiting.  Pt states symptoms on going for about 3 days.   HPI: HPI  Problems:  Patient Active Problem List   Diagnosis Date Noted   Low vitamin D level 04/14/2023   Prediabetes 04/14/2023   Hypertension 04/11/2023   BMI 45.0-49.9, adult (HCC) 04/11/2023    Allergies:  Allergies  Allergen Reactions   Lemon Juice [Lemon Oil] Swelling    Throat  closes up   Lactose Intolerance (Gi)    Lime Oil Swelling    Throat closes up    Medications:  Current Outpatient Medications:    benzonatate (TESSALON) 100 MG capsule, Take 1 capsule (100 mg total) by mouth 3 (three) times daily for 7 days., Disp: 21 capsule, Rfl: 0   dextromethorphan-guaiFENesin (MUCINEX DM) 30-600 MG 12hr tablet, Take 1 tablet by mouth 2 (two) times daily for 7 days., Disp: 14 tablet, Rfl: 0   oxymetazoline (AFRIN NASAL SPRAY) 0.05 % nasal spray, Place 1 spray into both nostrils 2 (two) times daily for 5 days., Disp: 1 mL, Rfl: 0    predniSONE (STERAPRED UNI-PAK 21 TAB) 10 MG (21) TBPK tablet, Take following package directions., Disp: 21 tablet, Rfl: 0   HYDROcodone-acetaminophen (NORCO/VICODIN) 5-325 MG tablet, Take 1 tablet by mouth every 6 (six) hours as needed for severe pain (pain score 7-10)., Disp: 6 tablet, Rfl: 0   naproxen (NAPROSYN) 500 MG tablet, Take 1 tablet (500 mg total) by mouth 2 (two) times daily., Disp: 20 tablet, Rfl: 0   norethindrone (AYGESTIN) 5 MG tablet, Take 1 tablet (5 mg total) by mouth daily., Disp: 30 tablet, Rfl: 2   ondansetron (ZOFRAN) 4 MG tablet, Take 1 tablet (4 mg total) by mouth every 8 (eight) hours as needed for nausea or vomiting., Disp: 20 tablet, Rfl: 1   Vitamin D, Ergocalciferol, (DRISDOL) 1.25 MG (50000 UNIT) CAPS capsule, Take 1 capsule (50,000 Units total) by mouth every 7 (seven) days for 12 doses., Disp: 12 capsule, Rfl: 0  Observations/Objective: Patient is well-developed, well-nourished in no acute distress.  Resting comfortably at home.  Head is normocephalic, atraumatic.  No labored breathing.  Speech is clear and coherent with logical content.  Patient is alert and oriented at baseline.  Pt noted to have a bout of nausea and vomiting during visit.   Assessment and Plan: 1. Bleeding nose - oxymetazoline (AFRIN NASAL SPRAY) 0.05 % nasal spray; Place 1 spray into both nostrils 2 (two) times daily for 5 days.  Dispense: 1 mL; Refill: 0  2. Cough in adult - predniSONE (STERAPRED UNI-PAK 21 TAB) 10 MG (21) TBPK tablet; Take following package directions.  Dispense: 21 tablet; Refill: 0 - dextromethorphan-guaiFENesin (MUCINEX DM) 30-600 MG 12hr tablet; Take 1 tablet by mouth 2 (two) times daily for 7 days.  Dispense: 14 tablet; Refill: 0 - benzonatate (TESSALON) 100 MG capsule; Take 1 capsule (100 mg total) by mouth 3 (three) times daily for 7 days.  Dispense: 21 capsule; Refill: 0  3. Nausea and vomiting, unspecified vomiting type  -Advised Pt if vomiting continues, to  proceed to the urgent care or emergency hospital for further evaluation. -Pt verbalized understanding  -Pt to start Afrin for nose bleeds, Pt to F/U with PCP or ENT if bleeding persists -Start Prednisone, mucinex and Tessalon for cough, F/U with PCP or urgent care for worsening symptoms.   Follow Up Instructions: I discussed the assessment and treatment plan with the patient. The patient was provided an opportunity to ask questions and all were answered. The patient agreed with the plan and demonstrated an understanding of the instructions.  A copy of instructions were sent to the patient via MyChart unless otherwise noted below.     The patient was advised to call back or seek an in-person evaluation if the symptoms worsen or if the condition fails to improve as anticipated.    Gabriela Pandy, PA-C

## 2023-06-24 ENCOUNTER — Encounter (HOSPITAL_COMMUNITY): Payer: Self-pay

## 2023-06-24 ENCOUNTER — Ambulatory Visit (HOSPITAL_COMMUNITY)
Admission: EM | Admit: 2023-06-24 | Discharge: 2023-06-24 | Disposition: A | Discharge status: Home / Self Care | Payer: 59 | Attending: Emergency Medicine | Admitting: Emergency Medicine

## 2023-06-24 DIAGNOSIS — N760 Acute vaginitis: Secondary | ICD-10-CM | POA: Insufficient documentation

## 2023-06-24 LAB — POCT URINALYSIS DIP (MANUAL ENTRY)
Bilirubin, UA: NEGATIVE
Blood, UA: NEGATIVE
Glucose, UA: NEGATIVE mg/dL
Ketones, POC UA: NEGATIVE mg/dL
Leukocytes, UA: NEGATIVE
Nitrite, UA: NEGATIVE
Protein Ur, POC: NEGATIVE mg/dL
Spec Grav, UA: 1.025 (ref 1.010–1.025)
Urobilinogen, UA: 0.2 U/dL
pH, UA: 5.5 (ref 5.0–8.0)

## 2023-06-24 LAB — POCT URINE PREGNANCY: Preg Test, Ur: NEGATIVE

## 2023-06-24 NOTE — ED Provider Notes (Signed)
[facility]    CSN: [id number] Arrival date & time: 06/24/23  2130      History   Chief Complaint Chief Complaint  Patient presents with   SEXUALLY TRANSMITTED DISEASE    HPI Gabriela Best is a 42 y.o. female.   Patient presents to clinic for lower abdominal pain and right flank pain after unprotected intercourse. Reports vaginal irritation and a slight odor.  No dysuria.  She has not taken any medications or tried any interventions for her symptoms.   She does have a history of uterine fibroids.   The history is provided by the patient and medical records.    Past Medical History:  Diagnosis Date   Abnormal menstrual cycle    Acute perforated appendicitis 11/14/2020   Migraine    Ovarian cyst    Symptomatic cholelithiasis 11/13/2020    Patient Active Problem List   Diagnosis Date Noted   Low vitamin D level 04/14/2023   Prediabetes 04/14/2023   Hypertension 04/11/2023   BMI 45.0-49.9, adult (HCC) 04/11/2023    Past Surgical History:  Procedure Laterality Date   BREAST BIOPSY Left 01/14/2023   MM LT BREAST BX W LOC DEV 1ST LESION IMAGE BX SPEC STEREO GUIDE 01/14/2023 [facility] M[facility]MO[facility]AP[facility]   CHOLECYSTECTOMY N/A 11/13/2020   Procedure: LAPAROSCOPIC CHOLECYSTECTOMY WITH INTRAOPERATIVE CHOLANGIOGRAM;  Surgeon: Quentin Ore, MD;  Location: [facility];  Service: General;  Laterality: N/A;   KNEE SURGERY     KNEE SURGERY Left     OB History     Gravida  1   Para      Term      Preterm      AB  1   Living         SAB      IAB  1   Ectopic      Multiple      Live Births           Obstetric Comments  Age 9 y/o EAB, d&c          Home Medications    Prior to Admission medications   Medication Sig Start Date End Date Taking? Authorizing Provider  HYDROcodone-acetaminophen (NORCO/VICODIN) 5-325 MG tablet Take 1 tablet by mouth every 6 (six) hours as needed for severe pain (pain score 7-10). 05/08/23   Gwyneth Sprout, MD   naproxen (NAPROSYN) 500 MG tablet Take 1 tablet (500 mg total) by mouth 2 (two) times daily. 05/08/23   Gwyneth Sprout, MD  norethindrone (AYGESTIN) 5 MG tablet Take 1 tablet (5 mg total) by mouth daily. 05/12/23   Lorriane Shire, MD  ondansetron (ZOFRAN) 4 MG tablet Take 1 tablet (4 mg total) by mouth every 8 (eight) hours as needed for nausea or vomiting. 05/12/23   Lorriane Shire, MD  predniSONE (STERAPRED UNI-PAK 21 TAB) 10 MG (21) TBPK tablet Take following package directions. 06/04/23   Reed Pandy, PA-C  Vitamin D, Ergocalciferol, (DRISDOL) 1.25 MG (50000 UNIT) CAPS capsule Take 1 capsule (50,000 Units total) by mouth every 7 (seven) days for 12 doses. 04/14/23 07/01/23  [place] Bing, MD    Family History Family History  Problem Relation Age of Onset   Breast cancer Maternal Grandmother     Social History Social History   Tobacco Use   Smoking status: Every Day    Current packs/day: 0.15    Types: Cigarettes   Smokeless tobacco: Never  Vaping Use   Vaping status: Never Used  Substance Use Topics   Alcohol  use: Yes    Comment: occ   Drug use: Never     Allergies   Lemon juice [lemon oil], Lactose intolerance (gi), and Lime oil   Review of Systems Review of Systems  Per HPI   Physical Exam Triage Vital Signs ED Triage Vitals  Encounter Vitals Group     BP 06/24/23 1012 123/80     Systolic BP Percentile --      Diastolic BP Percentile --      Pulse Rate 06/24/23 1012 78     Resp 06/24/23 1012 18     Temp 06/24/23 1012 98.5 F (36.9 C)     Temp Source 06/24/23 1012 Oral     SpO2 06/24/23 1012 99 %     Weight --      Height --      Head Circumference --      Peak Flow --      Pain Score 06/24/23 1014 7     Pain Loc --      Pain Education --      Exclude from Growth Chart --    No data found.  Updated Vital Signs BP 123/80 (BP Location: Left Arm)   Pulse 78   Temp 98.5 F (36.9 C) (Oral)   Resp 18   LMP 06/11/2023 (Exact Date)    SpO2 99%   Visual Acuity Right Eye Distance:   Left Eye Distance:   Bilateral Distance:    Right Eye Near:   Left Eye Near:    Bilateral Near:     Physical Exam Vitals and nursing note reviewed.  Constitutional:      Appearance: Normal appearance.  HENT:     Head: Normocephalic and atraumatic.     Right Ear: External ear normal.     Left Ear: External ear normal.     Nose: Nose normal.     Mouth/Throat:     Mouth: Mucous membranes are moist.  Eyes:     Conjunctiva/sclera: Conjunctivae normal.  Cardiovascular:     Rate and Rhythm: Normal rate.  Pulmonary:     Effort: Pulmonary effort is normal. No respiratory distress.  Abdominal:     Tenderness: There is right CVA tenderness. There is no left CVA tenderness.  Musculoskeletal:        General: Normal range of motion.  Skin:    General: Skin is warm and dry.  Neurological:     General: No focal deficit present.     Mental Status: She is alert.      UC Treatments / Results  Labs (all labs ordered are listed, but only abnormal results are displayed) Labs Reviewed  POCT URINALYSIS DIP (MANUAL ENTRY)  POCT URINE PREGNANCY  CERVICOVAGINAL ANCILLARY ONLY    EKG   Radiology No results found.  Procedures Procedures (including critical care time)  Medications Ordered in UC Medications - No data to display  Initial Impression / Assessment and Plan / UC Course  I have reviewed the triage vital signs and the nursing notes.  Pertinent labs & imaging results that were available during my care of the patient were reviewed by me and considered in my medical decision making (see chart for details).  Vitals and triage reviewed, patient is hemodynamically stable.  Is having right CVA tenderness, but no dysuria and urinalysis is unremarkable.  No fevers, nausea or vomiting.  Concern for STI due to recent unprotected sex with a new partner and changes to vaginal discharge, cytology swab obtained.  Staff will contact with  follow-up if anything results is abnormal.  Plan of care, follow-up care and return precautions given, no questions at this time.     Final Clinical Impressions(s) / UC Diagnoses   Final diagnoses:  Acute vaginitis     Discharge Instructions      Your urine did not show any signs of infection.  You have been screened for bacterial vaginosis, yeast vaginitis, gonorrhea, chlamydia and trichomoniasis with your vaginal swab.  This should result in the next day or so and our staff will contact you if any treatment is indicated.  Abstain from intercourse until all results have been received.  Return to clinic or follow-up with your primary care provider if your symptoms persist.     ED Prescriptions   None    PDMP not reviewed this encounter.   Rashida Ladouceur, [place], [place] 06/24/23 1036

## 2023-06-24 NOTE — ED Triage Notes (Signed)
Pt presents with "a burning sensation in my lower abdomen for about three days." Pt states she had unprotected sex with a friend a few days prior to symptoms starting. Pt denies taking medications for her symptoms/pain.

## 2023-06-24 NOTE — Discharge Instructions (Addendum)
Your urine did not show any signs of infection.  You have been screened for bacterial vaginosis, yeast vaginitis, gonorrhea, chlamydia and trichomoniasis with your vaginal swab.  This should result in the next day or so and our staff will contact you if any treatment is indicated.  Abstain from intercourse until all results have been received.  Return to clinic or follow-up with your primary care provider if your symptoms persist.

## 2023-06-27 ENCOUNTER — Telehealth (HOSPITAL_COMMUNITY): Payer: Self-pay

## 2023-06-27 LAB — CERVICOVAGINAL ANCILLARY ONLY
Bacterial Vaginitis (gardnerella): NEGATIVE
Candida Glabrata: POSITIVE — AB
Candida Vaginitis: NEGATIVE
Chlamydia: NEGATIVE
Comment: NEGATIVE
Comment: NEGATIVE
Comment: NEGATIVE
Comment: NEGATIVE
Comment: NEGATIVE
Comment: NORMAL
Neisseria Gonorrhea: NEGATIVE
Trichomonas: POSITIVE — AB

## 2023-06-27 MED ORDER — METRONIDAZOLE 500 MG PO TABS: 500.0000 mg | ORAL_TABLET | Freq: Two times a day (BID) | ORAL | 0 refills | Status: DC

## 2023-06-27 NOTE — Telephone Encounter (Signed)
Per protocol, pt requires tx with metronidazole. Reviewed with patient, verified pharmacy, prescription sent.

## 2023-06-29 ENCOUNTER — Other Ambulatory Visit: Payer: Self-pay | Admitting: Obstetrics and Gynecology

## 2023-06-30 ENCOUNTER — Ambulatory Visit (INDEPENDENT_AMBULATORY_CARE_PROVIDER_SITE_OTHER): Payer: 59 | Admitting: Obstetrics and Gynecology

## 2023-06-30 ENCOUNTER — Encounter: Payer: Self-pay | Admitting: Obstetrics and Gynecology

## 2023-06-30 ENCOUNTER — Other Ambulatory Visit: Payer: Self-pay

## 2023-06-30 VITALS — BP 113/64 | HR 84 | Wt 263.5 lb

## 2023-06-30 DIAGNOSIS — Z3169 Encounter for other general counseling and advice on procreation: Secondary | ICD-10-CM

## 2023-06-30 DIAGNOSIS — Z1331 Encounter for screening for depression: Secondary | ICD-10-CM

## 2023-06-30 DIAGNOSIS — N946 Dysmenorrhea, unspecified: Secondary | ICD-10-CM

## 2023-06-30 DIAGNOSIS — D219 Benign neoplasm of connective and other soft tissue, unspecified: Secondary | ICD-10-CM | POA: Diagnosis not present

## 2023-06-30 MED ORDER — NORETHINDRONE ACETATE 5 MG PO TABS: 10.0000 mg | ORAL_TABLET | Freq: Every day | ORAL | 2 refills | Status: DC

## 2023-06-30 NOTE — Patient Instructions (Addendum)
Endometrial ablation - burning of the lining of the uterus to decrease flow of the bleeding  Myomectomy - surgical removal of fibroids from the uterus  Hysterectomy - removal of the uterus, cervix, and fallopian tubes

## 2023-06-30 NOTE — Progress Notes (Signed)
GYNECOLOGY VISIT  Patient name: Gabriela Best MRN [id number]  Date of birth: 04-May-1981 Chief Complaint:   results follow up  History:  Analecia Gabriela Best is a 42 y.o. G1P0010 being seen today for folow up of MRI results.  Want the pain to go away. Pain has not improved. Currently on her period and feels the pain she is haivng now is hte pain of her normal period  Previously had an IUD and had it removed. Was very uncomfortable. Worked for the periods but was still having symptoms with her menses including nausea and physically felt uncomfortable with the IUD in place.   Notes feeling "over it" regarding the desire to conceive as well as her painful periods. She is interested in surgical management including myomectomy and hysterectomy. Wants ot have a hysterectomy but will send a message when she is absolutely sure. Offered referral to REI. She sates she previously tried to have a consultation and was not able to afford it. She also notes attempting to conceive "a couple of times" and feels that it is not going to happen.  She feels very sad about not having had a child and likely giving up that opportunity and pursuing a hysterectomy. She does point to having some fulfillment in having helped raised nieces and nephews.   Past Medical History:  Diagnosis Date   Abnormal menstrual cycle    Acute perforated appendicitis 11/14/2020   Migraine    Ovarian cyst    Symptomatic cholelithiasis 11/13/2020    Past Surgical History:  Procedure Laterality Date   BREAST BIOPSY Left 01/14/2023   MM LT BREAST BX W LOC DEV 1ST LESION IMAGE BX SPEC STEREO GUIDE 01/14/2023 [facility]AMMO[facility]   CHOLECYSTECTOMY N/A 11/13/2020   Procedure: LAPAROSCOPIC CHOLECYSTECTOMY WITH INTRAOPERATIVE CHOLANGIOGRAM;  Surgeon: Quentin Ore, MD;  Location: [facility];  Service: General;  Laterality: N/A;   KNEE SURGERY     KNEE SURGERY Left     The following portions of the patient's history were reviewed and updated  as appropriate: allergies, current medications, past family history, past medical history, past social history, past surgical history and problem list.   Health Maintenance:   Last pap     Component Value Date/Time   DIAGPAP  03/25/2023 0931    - Negative for intraepithelial lesion or malignancy (NILM)   HPVHIGH Negative 03/25/2023 0931   ADEQPAP  03/25/2023 0931    Satisfactory for evaluation; transformation zone component PRESENT.    High Risk HPV: Positive  Adequacy:  Satisfactory for evaluation, transformation zone component PRESENT  Diagnosis:  Atypical squamous cells of undetermined significance (ASC-US)   Last mammogram: 12/2022 BIRADS 4 > s/p bipsy on 12/2022   Review of Systems:  Pertinent items are noted in HPI. Comprehensive review of systems was otherwise negative.   Objective:  Physical Exam BP 113/64   Pulse 84   Wt 263 lb 8 oz (119.5 kg)   LMP 06/11/2023 (Exact Date)   BMI 46.68 kg/m    Physical Exam Vitals and nursing note reviewed.  Constitutional:      Appearance: Normal appearance.  HENT:     Head: Normocephalic and atraumatic.  Pulmonary:     Effort: Pulmonary effort is normal.  Skin:    General: Skin is warm and dry.  Neurological:     General: No focal deficit present.     Mental Status: She is alert.  Psychiatric:        Mood and Affect: Mood normal.  Behavior: Behavior normal.        Thought Content: Thought content normal.        Judgment: Judgment normal.      Labs and Imaging Narrative & Impression  CLINICAL DATA:  Fibroids, treatment planning   EXAM: MRI PELVIS WITHOUT AND WITH CONTRAST   TECHNIQUE: Multiplanar multisequence MR imaging of the pelvis was performed both before and after administration of intravenous contrast.   CONTRAST:  10mL GADAVIST GADOBUTROL 1 MMOL/ML IV SOLN   COMPARISON:  CT abdomen pelvis, 12/25/2022   FINDINGS: Urinary Tract: Incidental small urethral diverticulum of the posterior left  aspect of the urethra measuring 0.8 x 0.3 cm (series 3, image 32). No other abnormality visualized.   Bowel:  Occasional sigmoid diverticula.   Vascular/Lymphatic: No pathologically enlarged lymph nodes. No significant vascular abnormality seen.   Reproductive: Large fibroid with a submucosal component of the anterior uterine fundus measuring 6.9 x 5.8 x 4.9 cm (series 3, image 18, series 5, image 18). Mild mass effect on the underlying endometrial stripe with otherwise normal secretory appearance of the endometrium. Additional small exophytic subserosal arising from the apex of the uterine fundus measuring 3.3 x 3.3 x 3.0 cm (series 3, image 13, series 5, image 22). Small benign bilateral functional ovarian cysts and follicles, requiring no further follow-up or characterization.   Other:  None.   Musculoskeletal: No suspicious bone lesions identified.   IMPRESSION: 1. Large fibroid with a submucosal component of the anterior uterine fundus measuring 6.9 x 5.8 x 4.9 cm. Mild mass effect on the underlying endometrial stripe with otherwise normal secretory appearance of the endometrium. 2. Additional small exophytic subserosal fibroid arising from the apex of the uterine fundus measuring 3.3 x 3.3 x 3.0 cm. 3. Incidental small urethral diverticulum.     Electronically Signed   By: Jearld Lesch M.D.   On: 05/22/2023 22:05       Assessment & Plan:   1. Fibroid (Primary) Discussed medical and surgical management options of bleeding and fibroids. - increase dose of aygestin  - will message if/when she is ready to schedule hysterectomy - encouraged to pick up medications to treat trich infection - Ambulatory referral to Infertility  2. Infertility counseling Offered referral to REI to discuss possible IUI and IVF. Offered referral to [facility] as well and noted that it can be normal to grieve the loss of fertility/opporutnity - Ambulatory referral to Infertility   Routine  preventative health maintenance measures emphasized.  Lorriane Shire, MD Minimally Invasive Gynecologic Surgery Center for [facility], [facility]

## 2023-07-01 ENCOUNTER — Ambulatory Visit: Payer: Self-pay | Admitting: Licensed Clinical Social Worker

## 2023-07-01 DIAGNOSIS — F419 Anxiety disorder, unspecified: Secondary | ICD-10-CM

## 2023-07-05 NOTE — Patient Instructions (Signed)
Visit Information  Thank you for taking time to visit with me today. Please don't hesitate to contact me if I can be of assistance to you.   Following are the goals we discussed today:   Goals Addressed             This Visit's Progress    Obtain Supportive Resources   On track    Activities and task to complete in order to accomplish goals.   Keep all upcoming appointments discussed today Continue with compliance of taking medication prescribed by Doctor Implement healthy coping skills discussed to assist with management of symptoms  Review supportive information on crisis intervention resources Follow up on utility assistance resources provided through 211         Our next appointment is by telephone on 1/6 at 1:30 pm  Please call the care guide team at [phone] if you need to cancel or reschedule your appointment.   If you are experiencing a Mental Health or Behavioral Health Crisis or need someone to talk to, please call the Suicide and Crisis Lifeline: 988 call 911   Patient verbalizes understanding of instructions and care plan provided today and agrees to view in MyChart. Active MyChart status and patient understanding of how to access instructions and care plan via MyChart confirmed with patient.     Jenel Lucks, MSW, LCSW [facility] Care Management [place]  [facility] [email or web address] Phone [phone] 11:17 PM

## 2023-07-05 NOTE — Patient Outreach (Signed)
  Care Coordination   Follow Up Visit Note   07/01/2023 Name: Gabriela Best MRN: [id number] DOB: 04/25/1981  Gabriela Best is a 42 y.o. year old female who sees Ngetich, Donalee Citrin, NP for primary care. I spoke with  Gabriela Best by phone today.  What matters to the patients health and wellness today?  Symptom Management and Supportive Resources    Goals Addressed             This Visit's Progress    Obtain Supportive Resources   On track    Activities and task to complete in order to accomplish goals.   Keep all upcoming appointments discussed today Continue with compliance of taking medication prescribed by Doctor Implement healthy coping skills discussed to assist with management of symptoms  Review supportive information on crisis intervention resources Follow up on utility assistance resources provided through 211         SDOH assessments and interventions completed:  No     Care Coordination Interventions:  Yes, provided  Interventions Today    Flowsheet Row Most Recent Value  Chronic Disease   Chronic disease during today's visit Hypertension (HTN)  General Interventions   General Interventions Discussed/Reviewed General Interventions Reviewed, [facility], Doctor Visits, Communication with  Doctor Visits Discussed/Reviewed Doctor Visits Reviewed  Communication with Social Work  Conservator, museum/gallery Guide referral completed]  Mental Health Interventions   Mental Health Discussed/Reviewed Mental Health Reviewed, Coping Strategies, Anxiety, Depression       Follow up plan: Follow up call scheduled for 2-6 weeks    Encounter Outcome:  Patient Visit Completed   Jenel Lucks, MSW, LCSW [facility] Care Management [facility]  [facility] [email or web address] Phone [phone] 11:17 PM

## 2023-07-06 ENCOUNTER — Telehealth: Payer: Self-pay

## 2023-07-06 ENCOUNTER — Other Ambulatory Visit: Payer: Self-pay

## 2023-07-06 DIAGNOSIS — R7303 Prediabetes: Secondary | ICD-10-CM

## 2023-07-06 DIAGNOSIS — I10 Essential (primary) hypertension: Secondary | ICD-10-CM

## 2023-07-06 DIAGNOSIS — E785 Hyperlipidemia, unspecified: Secondary | ICD-10-CM

## 2023-07-06 NOTE — Progress Notes (Signed)
   Telephone encounter was:  Successful.  07/06/2023 Name: Gabriela Best MRN: [id number] DOB: [birth year]  Gabriela Best is a 41 y.o. year old female who is a primary care patient of Ngetich, Dinah C, NP . The community resource team was consulted for assistance with Financial Difficulties related to Utilities  SDOH screenings and interventions completed:  Yes  SDOH Interventions Today    Flowsheet Row Most Recent Value  SDOH Interventions   Utilities Interventions Community Resources Provided        Care guide performed the following interventions: Patient provided with information about care guide support team and interviewed to confirm resource needs.Patient requested resources for Financial Assistance for her Utilities. I was about to get her started Filling out application online for the LIE[employer], I have mailed and emailed resources to the patient and PT has my contact information for any concerns. Patient stated she is waiting for a letter from her employer to be approved  for assistance with [employer]   Follow Up Plan:  No further follow up planned at this time. The patient has been provided with needed resources.  Encounter Outcome:  Patient Visit Completed   Lenard Forth [facility]  [facility], [facility] Guide, Phone: [phone] Website: [email or web address]

## 2023-07-08 NOTE — BH Specialist Note (Signed)
 Integrated Behavioral Health via Telemedicine Visit  07/22/2023 Gabriela Best [id number]  Number of Integrated Behavioral Health Clinician visits: 1- Initial Visit  Session Start time: 0919   Session End time: 1008  Total time in minutes: 49   Referring Provider: Carter Quarry, MD Patient/Family location: Home [facility] Provider location: Center for [facility] at [facility]  All persons participating in visit: Patient Gabriela Best and [facility] Gabriela Best   Types of Service: Individual psychotherapy and Video visit  I connected with Gabriela Best and/or Gabriela Best's  n/a  via  Telephone or Video Enabled Telemedicine Application  (Video is Caregility application) and verified that I am speaking with the correct person using two identifiers. Discussed confidentiality: Yes   I discussed the limitations of telemedicine and the availability of in person appointments.  Discussed there is a possibility of technology failure and discussed alternative modes of communication if that failure occurs.  I discussed that engaging in this telemedicine visit, they consent to the provision of behavioral healthcare and the services will be billed under their insurance.  Patient and/or legal guardian expressed understanding and consented to Telemedicine visit: Yes   Presenting Concerns: Patient and/or family reports the following symptoms/concerns: Processing decision to have a hysterectomy and IVF consult; managing mixed emotions of wanting to not continue pain of fibroids, as well as wanting to try to have a baby of her own. Pt also experienced financial stress after being out of work grieving loss of client; noticing increase in blood pressure when stress level goes up; open to implementing self-coping strategy today.  Duration of problem: Increasing over time; Severity of problem: moderate  Patient and/or Family's Strengths/Protective Factors: Social connections,  Concrete supports in place (healthy food, safe environments, etc.), and Sense of purpose  Goals Addressed: Patient will:  Reduce symptoms of: anxiety, depression, and stress   Increase knowledge and/or ability of: self-management skills   Demonstrate ability to: Increase healthy adjustment to current life circumstances and Increase adequate support systems for patient/family  Progress towards Goals: Ongoing  Interventions: Interventions utilized:  Mindfulness or Management Consultant, Psychoeducation and/or Health Education, Link to [facility], and Supportive Reflection Standardized Assessments completed: Not Needed  Patient and/or Family Response: Patient agrees with treatment plan.    Assessment: Patient currently experiencing Adjustment disorder with mixed anxious and depressed mood; Psychosocial stress  Patient may benefit from psychoeducation and brief therapeutic interventions regarding coping with symptoms of depression and anxiety; life stress .  Plan: Follow up with behavioral health clinician on : Two weeks Behavioral recommendations:  -Continue plan to attend all upcoming medical appointments -CALM relaxation breathing exercise twice daily (morning; at bedtime); as needed throughout the day. -Read through resources on After Visit Summary; use as needed  Referral(s): Integrated Art Gallery Manager (In Clinic) and [facility]:  Infertility support group; possible IVF scholarship options  I discussed the assessment and treatment plan with the patient and/or parent/guardian. They were provided an opportunity to ask questions and all were answered. They agreed with the plan and demonstrated an understanding of the instructions.   They were advised to call back or seek an in-person evaluation if the symptoms worsen or if the condition fails to improve as anticipated.  Gabriela Best Gabriela Roughton, LCSW     06/30/2023    3:01 PM 05/12/2023    3:55 PM 04/28/2023     2:41 PM 03/25/2023   11:36 AM  Depression screen PHQ 2/9  Decreased Interest 2 1 0 1  Down, Depressed, Hopeless 2 1 0 1  PHQ - 2 Score 4 2 0 2  Altered sleeping 0 1  1  Tired, decreased energy 1 1  1   Change in appetite 1 1  1   Feeling bad or failure about yourself  1 1  1   Trouble concentrating 1 0  0  Moving slowly or fidgety/restless 0 0  0  Suicidal thoughts 0 0  1  PHQ-9 Score 8 6  7       06/30/2023    3:01 PM 05/12/2023    3:55 PM 03/25/2023   11:36 AM  GAD 7 : Generalized Anxiety Score  Nervous, Anxious, on Edge 1 0 0  Control/stop worrying 1 1 1   Worry too much - different things 3 1 1   Trouble relaxing 0 1 0  Restless 0 1 0  Easily annoyed or irritable 3 1 1   Afraid - awful might happen 1 0 0  Total GAD 7 Score 9 5 3

## 2023-07-22 ENCOUNTER — Ambulatory Visit (INDEPENDENT_AMBULATORY_CARE_PROVIDER_SITE_OTHER): Payer: Medicaid Other | Admitting: Clinical

## 2023-07-22 DIAGNOSIS — F4323 Adjustment disorder with mixed anxiety and depressed mood: Secondary | ICD-10-CM

## 2023-07-22 NOTE — Patient Instructions (Addendum)
 Center for [facility] at [facility] [address] [place], [place] [address] [phone] (main office) [phone] ([name]'s office)  [employer]  www.postpartum.net *Infertility Support Group   -------------------------------                         Possible IVF scholarship options:   [facility] https://cadefoundation.org/grants  [employer] Scholarships wealthtoys.de  [employer] https://www.patel.com/  Gift of Parenthood https://giftofparenthood.org/  Journey to Parenthood https://journeytoparenthood.org/faq.html/  Hope for Fertility maxblogs.de  [facility] https://starfishinfertilityfoundation.org/                   [place]  :  Pay it [employer] promotionalloans.[email or web address]

## 2023-07-25 ENCOUNTER — Ambulatory Visit: Payer: Self-pay | Admitting: Licensed Clinical Social Worker

## 2023-07-25 NOTE — BH Specialist Note (Signed)
Pt did not arrive to video visit and did not answer the phone; Left HIPPA-compliant message to call back Aryahna Spagna from [facility]  [facility] at  [phone] (Ilir Mahrt's office).  ?; left MyChart message for patient.  ? ?

## 2023-07-26 ENCOUNTER — Other Ambulatory Visit: Payer: 59

## 2023-07-27 LAB — COMPLETE METABOLIC PANEL WITH GFR
AG Ratio: 1.7 (calc) (ref 1.0–2.5)
ALT: 8 U/L (ref 6–29)
AST: 10 U/L (ref 10–30)
Albumin: 3.9 g/dL (ref 3.6–5.1)
Alkaline phosphatase (APISO): 61 U/L (ref 31–125)
BUN: 8 mg/dL (ref 7–25)
CO2: 27 mmol/L (ref 20–32)
Calcium: 8.7 mg/dL (ref 8.6–10.2)
Chloride: 106 mmol/L (ref 98–110)
Creat: 0.85 mg/dL (ref 0.50–0.99)
Globulin: 2.3 g/dL (ref 1.9–3.7)
Glucose, Bld: 94 mg/dL (ref 65–99)
Potassium: 4.7 mmol/L (ref 3.5–5.3)
Sodium: 138 mmol/L (ref 135–146)
Total Bilirubin: 0.4 mg/dL (ref 0.2–1.2)
Total Protein: 6.2 g/dL (ref 6.1–8.1)
eGFR: 89 mL/min/{1.73_m2} (ref >=60)

## 2023-07-27 LAB — CBC WITH DIFFERENTIAL/PLATELET
Absolute Lymphocytes: 2656 {cells}/uL (ref 850–3900)
Absolute Monocytes: 336 {cells}/uL (ref 200–950)
Basophils Absolute: 48 {cells}/uL (ref 0–200)
Basophils Relative: 0.6 %
Eosinophils Absolute: 448 {cells}/uL (ref 15–500)
Eosinophils Relative: 5.6 %
HCT: 35.6 % (ref 35.0–45.0)
Hemoglobin: 11.3 g/dL — ABNORMAL LOW (ref 11.7–15.5)
MCH: 26.5 pg — ABNORMAL LOW (ref 27.0–33.0)
MCHC: 31.7 g/dL — ABNORMAL LOW (ref 32.0–36.0)
MCV: 83.6 fL (ref 80.0–100.0)
MPV: 11.3 fL (ref 7.5–12.5)
Monocytes Relative: 4.2 %
Neutro Abs: 4512 {cells}/uL (ref 1500–7800)
Neutrophils Relative %: 56.4 %
Platelets: 381 10*3/uL (ref 140–400)
RBC: 4.26 10*6/uL (ref 3.80–5.10)
RDW: 15.4 % — ABNORMAL HIGH (ref 11.0–15.0)
Total Lymphocyte: 33.2 %
WBC: 8 10*3/uL (ref 3.8–10.8)

## 2023-07-27 LAB — HEMOGLOBIN A1C
Hgb A1c MFr Bld: 5.8 %{Hb} — ABNORMAL HIGH (ref <5.7)
Mean Plasma Glucose: 120 mg/dL
eAG (mmol/L): 6.6 mmol/L

## 2023-07-27 LAB — LIPID PANEL
Cholesterol: 161 mg/dL (ref <200)
HDL: 30 mg/dL — ABNORMAL LOW (ref >=50)
LDL Cholesterol (Calc): 117 mg/dL — ABNORMAL HIGH
Non-HDL Cholesterol (Calc): 131 mg/dL — ABNORMAL HIGH (ref <130)
Total CHOL/HDL Ratio: 5.4 (calc) — ABNORMAL HIGH (ref <5.0)
Triglycerides: 52 mg/dL (ref <150)

## 2023-07-27 LAB — TSH: TSH: 0.98 m[IU]/L

## 2023-07-28 NOTE — Patient Outreach (Signed)
  Care Coordination   Follow Up Visit Note   07/25/2023 Name: Loetta Connelley MRN: [id number] DOB: [birth year]  Leanor Voris is a 43 y.o. year old female who sees Ngetich, Roxan BROCKS, NP for primary care. I spoke with  Steen Shove by phone today.  What matters to the patients health and wellness today?  Symptom Management    Goals Addressed             This Visit's Progress    Obtain Supportive Resources   On track    Activities and task to complete in order to accomplish goals.   Keep all upcoming appointments discussed today Continue with compliance of taking medication prescribed by Doctor Implement healthy coping skills discussed to assist with management of symptoms  Review supportive information on crisis intervention resources Follow up on utility assistance resources provided through 211 Continue working with IBH LCSW to assist with management of symptoms         SDOH assessments and interventions completed:  No     Care Coordination Interventions:  Yes, provided  Interventions Today    Flowsheet Row Most Recent Value  Chronic Disease   Chronic disease during today's visit Hypertension (HTN), Other  [Stress]  General Interventions   General Interventions Discussed/Reviewed General Interventions Reviewed, Doctor Visits  Doctor Visits Discussed/Reviewed Doctor Visits Reviewed  Mental Health Interventions   Mental Health Discussed/Reviewed Mental Health Reviewed, Coping Strategies       Follow up plan: Follow up call scheduled for 2-4 weeks    Encounter Outcome:  Patient Visit Completed   Rolin Kerns, MSW, LCSW [facility] Care Management [facility]  [facility] [email or web address] Phone [phone] 5:31 PM

## 2023-07-28 NOTE — Patient Instructions (Signed)
 Visit Information  Thank you for taking time to visit with me today. Please don't hesitate to contact me if I can be of assistance to you.   Following are the goals we discussed today:   Goals Addressed             This Visit's Progress    Obtain Supportive Resources   On track    Activities and task to complete in order to accomplish goals.   Keep all upcoming appointments discussed today Continue with compliance of taking medication prescribed by Doctor Implement healthy coping skills discussed to assist with management of symptoms  Review supportive information on crisis intervention resources Follow up on utility assistance resources provided through 211 Continue working with IBH LCSW to assist with management of symptoms         Our next appointment is by telephone on 1/27 at 1:30 PM  Please call the care guide team at [phone] if you need to cancel or reschedule your appointment.   If you are experiencing a Mental Health or Behavioral Health Crisis or need someone to talk to, please call the Suicide and Crisis Lifeline: 988 call 911   Patient verbalizes understanding of instructions and care plan provided today and agrees to view in MyChart. Active MyChart status and patient understanding of how to access instructions and care plan via MyChart confirmed with patient.     Kasey Ewings, MSW, LCSW [facility] Care Management [place]  [facility] [email or web address] Phone [phone] 5:31 PM

## 2023-07-29 ENCOUNTER — Other Ambulatory Visit: Payer: Self-pay

## 2023-07-29 ENCOUNTER — Ambulatory Visit: Payer: 59 | Admitting: Family

## 2023-07-29 DIAGNOSIS — E785 Hyperlipidemia, unspecified: Secondary | ICD-10-CM

## 2023-07-29 DIAGNOSIS — R7303 Prediabetes: Secondary | ICD-10-CM

## 2023-08-05 ENCOUNTER — Ambulatory Visit: Payer: Medicaid Other | Admitting: Family

## 2023-08-05 ENCOUNTER — Encounter: Payer: Self-pay | Admitting: Family

## 2023-08-05 VITALS — BP 128/72 | HR 75 | Temp 97.7°F | Resp 20 | Ht 63.0 in | Wt 265.8 lb

## 2023-08-05 DIAGNOSIS — M25642 Stiffness of left hand, not elsewhere classified: Secondary | ICD-10-CM

## 2023-08-05 DIAGNOSIS — N946 Dysmenorrhea, unspecified: Secondary | ICD-10-CM

## 2023-08-05 DIAGNOSIS — L309 Dermatitis, unspecified: Secondary | ICD-10-CM

## 2023-08-05 DIAGNOSIS — E559 Vitamin D deficiency, unspecified: Secondary | ICD-10-CM

## 2023-08-05 DIAGNOSIS — E785 Hyperlipidemia, unspecified: Secondary | ICD-10-CM | POA: Diagnosis not present

## 2023-08-05 DIAGNOSIS — I10 Essential (primary) hypertension: Secondary | ICD-10-CM

## 2023-08-05 DIAGNOSIS — R7303 Prediabetes: Secondary | ICD-10-CM | POA: Diagnosis not present

## 2023-08-05 MED ORDER — NORETHINDRONE ACETATE 5 MG PO TABS: 10.0000 mg | ORAL_TABLET | Freq: Every day | ORAL | 2 refills | Status: DC

## 2023-08-05 NOTE — Progress Notes (Signed)
Provider: Richarda Blade FNP-C   Kemani Heidel, Donalee Citrin, NP  Patient Care Team: Colby Reels, Donalee Citrin, NP as PCP - General (Family Medicine) Bridgett Larsson, LCSW as Social Worker (Licensed Clinical Social Worker)  Extended Emergency Contact Information Primary Emergency Contact: Hopkins,Shaniqua Home Phone: [phone] Relation: Significant other  Code Status:  Full Code  Goals of care: Advanced Directive information    08/05/2023    1:53 PM  Advanced Directives  Does Patient Have a Medical Advance Directive? No  Would patient like information on creating a medical advance directive? No - Patient declined     Chief Complaint  Patient presents with   Medical Management of Chronic Issues    3 month follow up and discuss Pneumococcal and covid vaccines.    HPI:  Pt is a 43 y.o. female seen today for 3 months follow up for  medical management of chronic diseases.   Prediabetes - has changed diet and snacking.A1C down to 5.8 from 6.0   Hyperlipidemia - LDL 117,HDL 30    Ezcema - states has improved with triamcinolone cream.gets better during the winter and spring but worst with summer.   She complains of intermittent stiffness of left fingers unable to straighten finger.fingers are usually painful " frozen in bend position.     Past Medical History:  Diagnosis Date   Abnormal menstrual cycle    Acute perforated appendicitis 11/14/2020   Migraine    Ovarian cyst    Symptomatic cholelithiasis 11/13/2020   Past Surgical History:  Procedure Laterality Date   BREAST BIOPSY Left 01/14/2023   MM LT BREAST BX W LOC DEV 1ST LESION IMAGE BX SPEC STEREO GUIDE 01/14/2023 [facility] MAMMOGRAP[facility]   CHOLECYSTECTOMY N/A 11/13/2020   Procedure: LAPAROSCOPIC CHOLECYSTECTOMY WITH INTRAOPERATIVE CHOLANGIOGRAM;  Surgeon: Quentin Ore, MD;  Location: [facility];  Service: General;  Laterality: N/A;   KNEE SURGERY     KNEE SURGERY Left     Allergies  Allergen Reactions   Lemon Juice [Lemon  Oil] Swelling    Throat closes up   Lactose Intolerance (Gi)    Lime Oil Swelling    Throat closes up     Allergies as of 08/05/2023       Reactions   Lemon Juice [lemon Oil] Swelling   Throat closes up   Lactose Intolerance (gi)    Lime Oil Swelling   Throat closes up         Medication List        Accurate as of August 05, 2023  2:18 PM. If you have any questions, ask your nurse or doctor.          HYDROcodone-acetaminophen 5-325 MG tablet Commonly known as: NORCO/VICODIN Take 1 tablet by mouth every 6 (six) hours as needed for severe pain (pain score 7-10).   metroNIDAZOLE 500 MG tablet Commonly known as: FLAGYL Take 1 tablet (500 mg total) by mouth 2 (two) times daily.   naproxen 500 MG tablet Commonly known as: NAPROSYN Take 1 tablet (500 mg total) by mouth 2 (two) times daily.   norethindrone 5 MG tablet Commonly known as: AYGESTIN Take 2 tablets (10 mg total) by mouth daily.   ondansetron 4 MG tablet Commonly known as: Zofran Take 1 tablet (4 mg total) by mouth every 8 (eight) hours as needed for nausea or vomiting.        Review of Systems  Constitutional:  Negative for appetite change, chills, fatigue, fever and unexpected weight change.  HENT:  Negative  for congestion, dental problem, ear discharge, ear pain, facial swelling, hearing loss, nosebleeds, postnasal drip, rhinorrhea, sinus pressure, sinus pain, sneezing, sore throat, tinnitus and trouble swallowing.   Eyes:  Negative for pain, discharge, redness, itching and visual disturbance.  Respiratory:  Negative for cough, chest tightness, shortness of breath and wheezing.   Cardiovascular:  Negative for chest pain, palpitations and leg swelling.  Gastrointestinal:  Negative for abdominal distention, abdominal pain, blood in stool, constipation, diarrhea, nausea and vomiting.  Endocrine: Negative for cold intolerance, heat intolerance, polydipsia, polyphagia and polyuria.  Genitourinary:   Negative for difficulty urinating, dysuria, flank pain, frequency and urgency.  Musculoskeletal:  Negative for arthralgias, back pain, gait problem, joint swelling, myalgias, neck pain and neck stiffness.  Skin:  Negative for color change, pallor, rash and wound.       Eczema has improved   Neurological:  Negative for dizziness, syncope, speech difficulty, weakness, light-headedness, numbness and headaches.  Hematological:  Does not bruise/bleed easily.  Psychiatric/Behavioral:  Negative for agitation, behavioral problems, confusion, hallucinations, self-injury, sleep disturbance and suicidal ideas. The patient is not nervous/anxious.     Immunization History  Administered Date(s) Administered   Tdap 04/27/2022   Pertinent  Health Maintenance Due  Topic Date Due   INFLUENZA VACCINE  10/17/2023 (Originally 02/17/2023)      02/02/2022    8:14 AM 04/26/2022   11:43 PM 04/27/2022    5:12 PM 04/28/2023    2:41 PM 08/05/2023    1:53 PM  Fall Risk  Falls in the past year?    0 0  Was there an injury with Fall?     0  Fall Risk Category Calculator     0  (RETIRED) Patient Fall Risk Level Low fall risk Low fall risk Low fall risk     Functional Status Survey:    Vitals:   08/05/23 1359  BP: 128/72  Pulse: 75  Resp: 20  Temp: 97.7 F (36.5 C)  SpO2: 99%  Weight: 265 lb 12.8 oz (120.6 kg)  Height: 5\' 3"  (1.6 m)   Body mass index is 47.08 kg/m. Physical Exam Vitals reviewed.  Constitutional:      General: She is not in acute distress.    Appearance: Normal appearance. She is obese. She is not ill-appearing or diaphoretic.  HENT:     Head: Normocephalic.     Right Ear: Tympanic membrane, ear canal and external ear normal. There is no impacted cerumen.     Left Ear: Tympanic membrane, ear canal and external ear normal. There is no impacted cerumen.     Nose: Nose normal. No congestion or rhinorrhea.     Mouth/Throat:     Mouth: Mucous membranes are moist.     Pharynx:  Oropharynx is clear. No oropharyngeal exudate or posterior oropharyngeal erythema.  Eyes:     General: No scleral icterus.       Right eye: No discharge.        Left eye: No discharge.     Extraocular Movements: Extraocular movements intact.     Conjunctiva/sclera: Conjunctivae normal.     Pupils: Pupils are equal, round, and reactive to light.  Neck:     Vascular: No carotid bruit.  Cardiovascular:     Rate and Rhythm: Normal rate and regular rhythm.     Pulses: Normal pulses.     Heart sounds: Normal heart sounds. No murmur heard.    No friction rub. No gallop.  Pulmonary:     Effort:  Pulmonary effort is normal. No respiratory distress.     Breath sounds: Normal breath sounds. No wheezing, rhonchi or rales.  Chest:     Chest wall: No tenderness.  Abdominal:     General: Bowel sounds are normal. There is no distension.     Palpations: Abdomen is soft. There is no mass.     Tenderness: There is no abdominal tenderness. There is no right CVA tenderness, left CVA tenderness, guarding or rebound.  Musculoskeletal:        General: No swelling or tenderness. Normal range of motion.     Cervical back: Normal range of motion. No rigidity or tenderness.     Right lower leg: No edema.     Left lower leg: No edema.  Lymphadenopathy:     Cervical: No cervical adenopathy.  Skin:    General: Skin is warm and dry.     Coloration: Skin is not pale.     Findings: No bruising, erythema, lesion or rash.  Neurological:     Mental Status: She is alert and oriented to person, place, and time.     Cranial Nerves: No cranial nerve deficit.     Sensory: No sensory deficit.     Motor: No weakness.     Coordination: Coordination normal.     Gait: Gait normal.  Psychiatric:        Mood and Affect: Mood normal.        Speech: Speech normal.        Behavior: Behavior normal.        Thought Content: Thought content normal.        Judgment: Judgment normal.     Labs reviewed: Recent Labs     04/11/23 1123 05/08/23 1116 07/26/23 0804  NA 139 138 138  K 4.5 4.3 4.7  CL 106 104 106  CO2 22 22 27   GLUCOSE 101* 95 94  BUN 12 6 8   CREATININE 0.94 0.85 0.85  CALCIUM 8.8 9.1 8.7   Recent Labs    01/26/23 1935 04/11/23 1123 05/08/23 1116 07/26/23 0804  AST 12* 12 13* 10  ALT 17 8 11 8   ALKPHOS 65 66 59  --   BILITOT 0.5 <0.2 0.8 0.4  PROT 6.3* 6.3 6.6 6.2  ALBUMIN 3.4* 3.9 3.5  --    Recent Labs    01/26/23 1935 04/11/23 1123 05/08/23 1116 07/26/23 0804  WBC 8.8 7.7 8.5 8.0  NEUTROABS 5.2  --   --  4,512  HGB 10.9* 11.8 11.4* 11.3*  HCT 34.3* 36.7 37.5 35.6  MCV 86.4 85 87.6 83.6  PLT 400 396 350 381   Lab Results  Component Value Date   TSH 0.98 07/26/2023   Lab Results  Component Value Date   HGBA1C 5.8 (H) 07/26/2023   Lab Results  Component Value Date   CHOL 161 07/26/2023   HDL 30 (L) 07/26/2023   LDLCALC 117 (H) 07/26/2023   TRIG 52 07/26/2023   CHOLHDL 5.4 (H) 07/26/2023    Significant Diagnostic Results in last 30 days:  No results found.  Assessment/Plan  1. Hyperlipidemia LDL goal <100 (Primary) LDL 117  -Dietary modification and exercise at least 30 minutes 3 times per week - Lipid panel; Future  2. Prediabetes Lab Results  Component Value Date   HGBA1C 5.8 (H) 07/26/2023  -Dietary modification and exercise advised - TSH; Future - COMPLETE METABOLIC PANEL WITH GFR; Future - CBC with Differential/Platelet; Future - Hemoglobin A1c; Future  3. Primary  hypertension B/p well controlled  - continue dietary modification and exercise at least three times per week for 30 minutes.  - TSH; Future - COMPLETE METABOLIC PANEL WITH GFR; Future - CBC with Differential/Platelet; Future  4. Eczema, unspecified type Has improved on triamcinolone oh  5. Vitamin D deficiency Previous low level we will recheck vitamin D level - Vitamin D, 1,25-dihydroxy; Future  6. Dysmenorrhea Recommend follow-up with a gynecologist. She will call  to book appointment since she is already established  7. Stiffness of finger joint of left hand Over-the-counter analgesic advised Will refer to orthopedic for further evaluation - Ambulatory referral to Orthopedic Surgery   Family/ staff Communication: Reviewed plan of care with patient verbalized understanding  Labs/tests ordered:  - Lipid panel; Future - Hemoglobin A1c; Future - TSH; Future - COMPLETE METABOLIC PANEL WITH GFR; Future - CBC with Differential/Platelet; Future - Vitamin D, 1,25-dihydroxy; Future  Next Appointment : Return in about 4 months (around 12/03/2023) for medical mangement of chronic issues., fasting labs prior to visit.   Caesar Bookman, NP

## 2023-08-05 NOTE — Patient Instructions (Signed)
Recommend a low saturated fats, low carbohydrates and high vegetable diet also exercise at least 3 times per week for 30 minutes.

## 2023-08-08 ENCOUNTER — Ambulatory Visit: Payer: Medicaid Other | Admitting: Clinical

## 2023-08-10 ENCOUNTER — Encounter: Payer: Self-pay | Admitting: Physician Assistant

## 2023-08-10 ENCOUNTER — Ambulatory Visit: Payer: Medicaid Other | Admitting: Physician Assistant

## 2023-08-10 DIAGNOSIS — M79642 Pain in left hand: Secondary | ICD-10-CM

## 2023-08-10 NOTE — Progress Notes (Signed)
Office Visit Note   Patient: Gabriela Best           Date of Birth: December 19, 1980           MRN: [id number] Visit Date: 08/10/2023              Requested by: Caesar Bookman, NP [address] [employer],  [place] [address] PCP: Caesar Bookman, NP   Assessment & Plan: Visit Diagnoses:  1. 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  She has a 3-month history of intermittent pain and what she describes as locking in her left long finger.  She originally presented to the emergency room last June and she thought she may have had an injury at the night before.  X-rays there did not reveal any abnormalities or fractures.  She does not have any pain or locking today over the A1 pulley.  She said sometimes it gets stuck in extension and she has more pain dorsally.  Her exam today is benign however given the long time the symptoms of going on I recommended working with a hand therapist.  Follow-Up Instructions: No follow-ups on file.   Orders:  No orders of the defined types were placed in this encounter.  No orders of the defined types were placed in this encounter.     Procedures: No procedures performed   Clinical Data: No additional findings.   Subjective: Chief Complaint  Patient presents with   Left Hand - Pain    Patient is a pleasant 43 year old woman with a 37-month history of left long finger pain.  She may have had an injury and was seen in the emergency room.  Exam other than swelling of the finger was benign as were the x-rays.  She still has occasional pain in this finger and a get stuck in extension.  She has pain at the [facility] joint dorsally not volar.  Review of Systems  All other systems reviewed and are negative.    Objective: Vital Signs: There were no vitals taken for this visit.  Physical Exam Constitutional:      Appearance: Normal appearance.  Pulmonary:     Effort: Pulmonary effort is  normal.  Neurological:     General: No focal deficit present.     Mental Status: She is alert and oriented to person, place, and time.  Psychiatric:        Mood and Affect: Mood normal.        Behavior: Behavior normal.     Ortho Exam Examination left hand she is neurovascular intact brisk capillary refill pulses are intact no redness no erythema no swelling.  She has no tenderness to palpation she has active and passive motion of her finger.  No tenderness over the A1 pulley Specialty Comments:  No specialty comments available.  Imaging: No results found.   PMFS History: Patient Active Problem List   Diagnosis Date Noted   Pain in left hand 08/10/2023   Low vitamin D level 04/14/2023   Prediabetes 04/14/2023   Hypertension 04/11/2023   BMI 45.0-49.9, adult (HCC) 04/11/2023   Past Medical History:  Diagnosis Date   Abnormal menstrual cycle    Acute perforated appendicitis 11/14/2020   Migraine    Ovarian cyst    Symptomatic cholelithiasis 11/13/2020    Family History  Problem Relation Age of Onset   Breast cancer Maternal Grandmother  Past Surgical History:  Procedure Laterality Date   BREAST BIOPSY Left 01/14/2023   MM LT BREAST BX W LOC DEV 1ST LESION IMAGE BX SPEC STEREO GUIDE 01/14/2023 [facility]   CHOLECYSTECTOMY N/A 11/13/2020   Procedure: LAPAROSCOPIC CHOLECYSTECTOMY WITH INTRAOPERATIVE CHOLANGIOGRAM;  Surgeon: Quentin Ore, MD;  Location: [facility];  Service: General;  Laterality: N/A;   KNEE SURGERY     KNEE SURGERY Left    Social History   Occupational History   Not on file  Tobacco Use   Smoking status: Every Day    Current packs/day: 0.15    Types: Cigarettes   Smokeless tobacco: Never  Vaping Use   Vaping status: Never Used  Substance and Sexual Activity   Alcohol use: Yes    Comment: occ   Drug use: Never   Sexual activity: Yes    Comment: same partner

## 2023-08-10 NOTE — Progress Notes (Signed)
GYNECOLOGY VISIT  Patient name: Gabriela Best MRN [id number]  Date of birth: [birth year] Chief Complaint:   uterine bleeding  History:  Bruce Mayers is a 43 y.o. G1P0010 being seen today for AUB-L follow up.  Had appointment to see REI and due to change in insurance unable to be seen.   Not taking the aygestin - ran out a few weeks ago. Had 2 menses the month prior and no menses as of yet . Felt the bleeding was better with the aygestin Would prefer sonata first before underoing any laparoscopic procedure due to concern for surgical risks; mostly ok with the idea of not carrying a pregnancy   06/2023: previously had IUD that was removed; prevoiusly referred to [facility] and increased dose of aygestin  Past Medical History:  Diagnosis Date   Abnormal menstrual cycle    Acute perforated appendicitis 11/14/2020   Migraine    Ovarian cyst    Symptomatic cholelithiasis 11/13/2020    Past Surgical History:  Procedure Laterality Date   BREAST BIOPSY Left 01/14/2023   MM LT BREAST BX W LOC DEV 1ST LESION IMAGE BX SPEC STEREO GUIDE 01/14/2023 [facility]AMMO[facility]   CHOLECYSTECTOMY N/A 11/13/2020   Procedure: LAPAROSCOPIC CHOLECYSTECTOMY WITH INTRAOPERATIVE CHOLANGIOGRAM;  Surgeon: Quentin Ore, MD;  Location: [facility];  Service: General;  Laterality: N/A;   KNEE SURGERY     KNEE SURGERY Left     The following portions of the patient's history were reviewed and updated as appropriate: allergies, current medications, past family history, past medical history, past social history, past surgical history and problem list.   Health Maintenance:   Last pap     Component Value Date/Time   DIAGPAP  03/25/2023 0931    - Negative for intraepithelial lesion or malignancy (NILM)   HPVHIGH Negative 03/25/2023 0931   ADEQPAP  03/25/2023 0931    Satisfactory for evaluation; transformation zone component PRESENT.    High Risk HPV: Positive  Adequacy:  Satisfactory for evaluation, transformation  zone component PRESENT  Diagnosis:  Atypical squamous cells of undetermined significance (ASC-US)  Last mammogram: 12/2022 BIRADS 3   Review of Systems:  Pertinent items are noted in HPI. Comprehensive review of systems was otherwise negative.   Objective:  Physical Exam BP (!) 162/93   Pulse 68   Wt 267 lb (121.1 kg)   LMP 07/07/2023 (Exact Date)   BMI 47.30 kg/m    Physical Exam Vitals and nursing note reviewed. Exam conducted with a chaperone present.  Constitutional:      Appearance: Normal appearance.  HENT:     Head: Normocephalic and atraumatic.  Pulmonary:     Effort: Pulmonary effort is normal.     Breath sounds: Normal breath sounds.  Genitourinary:    General: Normal vulva.     Exam position: Lithotomy position.     Vagina: Normal.     Cervix: Normal.  Skin:    General: Skin is warm and dry.  Neurological:     General: No focal deficit present.     Mental Status: She is alert.  Psychiatric:        Mood and Affect: Mood normal.        Behavior: Behavior normal.        Thought Content: Thought content normal.        Judgment: Judgment normal.      Endometrial Biopsy Procedure  Patient identified, informed consent performed,  indication reviewed, consent signed.  Reviewed risk of perforation, pain, bleeding,  insufficient sample, etc were reviewd. Time out was performed.  Urine pregnancy test negative.  Speculum placed in the vagina.  Cervix visualized.  Cleaned with Betadine x 2.  Anterior cervix grasped anteriorly with a single tooth tenaculum.  Paracervical block was administered.  Endometrial pipelle was used to draw up 1cc of 1% lidocaine, introduced into the cervical os and instilled into the endometrial cavity.  The pipelle was passed twice without difficulty and sample obtained. Tenaculum was removed, good hemostasis noted.  Patient tolerated procedure well.  Patient was given post-procedure instructions.      Assessment & Plan:   1.  Dysmenorrhea 2. Abnormal uterine bleeding (AUB) Will resume aygestin for management of ABU and after discussion of surgical options, will plan for Sonata procedure. Now s/p uncomplicated EMB.  - Pregnancy, urine POC - norethindrone (AYGESTIN) 5 MG tablet; Take 1 tablet (5 mg total) by mouth daily.  Dispense: 60 tablet; Refill: 6 - ibuprofen (ADVIL) tablet 800 mg - Surgical pathology - Ambulatory Referral For Surgery Scheduling  Patient desires surgical management with Sonata.  The risks of surgery were discussed in detail with the patient including but not limited to: bleeding which may require transfusion or reoperation; infection which may require prolonged hospitalization or re-hospitalization and antibiotic therapy; injury to bowel, bladder, ureters and major vessels or other surrounding organs which may lead to other procedures; formation of adhesions; need for additional procedures including laparotomy or subsequent procedures secondary to intraoperative injury or abnormal pathology; thromboembolic phenomenon; incisional problems and other postoperative or anesthesia complications.  Patient was told that the likelihood that her condition and symptoms will be treated effectively with this surgical management was moderate-high; the postoperative expectations were also discussed in detail. The patient also understands the alternative treatment options which were discussed in full. All questions were answered.  She was told that she will be contacted by our surgical scheduler regarding the time and date of her surgery; routine preoperative instructions will be given to her by the preoperative nursing team.    Printed patient education handouts about the procedure were given to the patient to review at home.   Routine preventative health maintenance measures emphasized.  Lorriane Shire, MD Minimally Invasive Gynecologic Surgery [facility], [facility]

## 2023-08-11 ENCOUNTER — Ambulatory Visit: Payer: Medicaid Other | Admitting: Obstetrics and Gynecology

## 2023-08-11 ENCOUNTER — Other Ambulatory Visit (HOSPITAL_COMMUNITY)
Admission: RE | Admit: 2023-08-11 | Discharge: 2023-08-11 | Disposition: A | Discharge status: Home / Self Care | Payer: Medicaid Other | Source: Ambulatory Visit | Attending: Obstetrics and Gynecology | Admitting: Obstetrics and Gynecology

## 2023-08-11 DIAGNOSIS — N946 Dysmenorrhea, unspecified: Secondary | ICD-10-CM | POA: Diagnosis not present

## 2023-08-11 DIAGNOSIS — Z1331 Encounter for screening for depression: Secondary | ICD-10-CM | POA: Diagnosis not present

## 2023-08-11 DIAGNOSIS — N939 Abnormal uterine and vaginal bleeding, unspecified: Secondary | ICD-10-CM | POA: Diagnosis not present

## 2023-08-11 DIAGNOSIS — Z3202 Encounter for pregnancy test, result negative: Secondary | ICD-10-CM | POA: Diagnosis not present

## 2023-08-11 LAB — POCT PREGNANCY, URINE: Preg Test, Ur: NEGATIVE

## 2023-08-11 MED ORDER — IBUPROFEN 800 MG PO TABS
800.0000 mg | ORAL_TABLET | Freq: Once | ORAL | Status: AC
  Administered 2023-08-11: 800 mg via ORAL

## 2023-08-11 MED ORDER — NORETHINDRONE ACETATE 5 MG PO TABS: 5.0000 mg | ORAL_TABLET | Freq: Every day | ORAL | 6 refills | Status: DC

## 2023-08-11 NOTE — Patient Instructions (Signed)
Sonata proceudre

## 2023-08-15 ENCOUNTER — Ambulatory Visit: Payer: Self-pay | Admitting: Licensed Clinical Social Worker

## 2023-08-15 ENCOUNTER — Other Ambulatory Visit: Payer: Self-pay | Admitting: Family

## 2023-08-15 DIAGNOSIS — Z1231 Encounter for screening mammogram for malignant neoplasm of breast: Secondary | ICD-10-CM

## 2023-08-15 LAB — SURGICAL PATHOLOGY

## 2023-08-16 ENCOUNTER — Ambulatory Visit: Payer: Medicaid Other | Admitting: Occupational Therapy

## 2023-08-16 ENCOUNTER — Encounter: Payer: Self-pay | Admitting: Obstetrics and Gynecology

## 2023-08-16 NOTE — Patient Outreach (Signed)
  Care Coordination   Follow Up Visit Note   08/15/2023 Name: Gabriela Best MRN: [id number] DOB: [birth year]  Gabriela Best is a 43 y.o. year old female who sees Ngetich, Donalee Citrin, NP for primary care. I spoke with  Trevor Iha by phone today.  What matters to the patients health and wellness today?  Symptom Management    Goals Addressed             This Visit's Progress    Obtain Supportive Resources   On track    Activities and task to complete in order to accomplish goals.   Keep all upcoming appointments discussed today Continue with compliance of taking medication prescribed by Doctor Implement healthy coping skills discussed to assist with management of symptoms  Review supportive information on crisis intervention resources Continue working with IBH LCSW to assist with management of symptoms         SDOH assessments and interventions completed:  No     Care Coordination Interventions:  Yes, provided  Interventions Today    Flowsheet Row Most Recent Value  Chronic Disease   Chronic disease during today's visit Hypertension (HTN), Other  [Stress]  General Interventions   General Interventions Discussed/Reviewed General Interventions Reviewed, Doctor Visits  Doctor Visits Discussed/Reviewed Doctor Visits Reviewed  Mental Health Interventions   Mental Health Discussed/Reviewed Mental Health Reviewed, Coping Strategies       Follow up plan: Follow up call scheduled for 2-4 weeks    Encounter Outcome:  Patient Visit Completed   Jenel Lucks, LCSW [place]  [facility], [facility] Clinical Social Worker Direct Dial: [phone]  Fax: [phone] Website: [email or web address] 3:52 PM

## 2023-08-16 NOTE — Patient Instructions (Signed)
Visit Information  Thank you for taking time to visit with me today. Please don't hesitate to contact me if I can be of assistance to you.   Following are the goals we discussed today:   Goals Addressed             This Visit's Progress    Obtain Supportive Resources   On track    Activities and task to complete in order to accomplish goals.   Keep all upcoming appointments discussed today Continue with compliance of taking medication prescribed by Doctor Implement healthy coping skills discussed to assist with management of symptoms  Review supportive information on crisis intervention resources Continue working with IBH LCSW to assist with management of symptoms         Our next appointment is by telephone on 2/17 at 3:30 PM  Please call the care guide team at [phone] if you need to cancel or reschedule your appointment.   If you are experiencing a Mental Health or Behavioral Health Crisis or need someone to talk to, please call the Suicide and Crisis Lifeline: 988 call 911   Patient verbalizes understanding of instructions and care plan provided today and agrees to view in MyChart. Active MyChart status and patient understanding of how to access instructions and care plan via MyChart confirmed with patient.     Gabriela Best [facility]  [facility], [facility] Clinical Social Worker Direct Dial: [phone]  Fax: [phone] Website: [email or web address] 3:52 PM

## 2023-08-17 ENCOUNTER — Other Ambulatory Visit: Payer: Self-pay

## 2023-08-17 ENCOUNTER — Encounter: Payer: Self-pay | Admitting: Occupational Therapy

## 2023-08-17 ENCOUNTER — Ambulatory Visit: Payer: Medicaid Other | Attending: Family | Admitting: Occupational Therapy

## 2023-08-17 DIAGNOSIS — M6281 Muscle weakness (generalized): Secondary | ICD-10-CM | POA: Diagnosis present

## 2023-08-17 DIAGNOSIS — R278 Other lack of coordination: Secondary | ICD-10-CM | POA: Diagnosis present

## 2023-08-17 DIAGNOSIS — M79642 Pain in left hand: Secondary | ICD-10-CM | POA: Diagnosis present

## 2023-08-17 DIAGNOSIS — R208 Other disturbances of skin sensation: Secondary | ICD-10-CM | POA: Diagnosis present

## 2023-08-17 NOTE — Therapy (Unsigned)
OUTPATIENT OCCUPATIONAL THERAPY ORTHO EVALUATION  Patient Name: Gabriela Best MRN: [id number] DOB:[birth year], 43 y.o., female Today's Date: 08/17/2023  PCP: Caesar Bookman, NP REFERRING PROVIDER: Persons, [place], [place]  END OF SESSION:  OT End of Session - 08/17/23 1404     Visit Number 1    Number of Visits 5   including evaluation   Authorization Type [employer] Medicaid 2025    OT Start Time 1403    OT Stop Time 1445    OT Time Calculation (min) 42 min    Equipment Utilized During Treatment Testing Material    Activity Tolerance Patient tolerated treatment well    Behavior During Therapy [facility] for tasks assessed/performed             Past Medical History:  Diagnosis Date   Abnormal menstrual cycle    Acute perforated appendicitis 11/14/2020   Migraine    Ovarian cyst    Symptomatic cholelithiasis 11/13/2020   Past Surgical History:  Procedure Laterality Date   BREAST BIOPSY Left 01/14/2023   MM LT BREAST BX W LOC DEV 1ST LESION IMAGE BX SPEC STEREO GUIDE 01/14/2023 [facility] MAMMOGRAPHY   CHOLECYSTECTOMY N/A 11/13/2020   Procedure: LAPAROSCOPIC CHOLECYSTECTOMY WITH INTRAOPERATIVE CHOLANGIOGRAM;  Surgeon: Quentin Ore, MD;  Location: [facility];  Service: General;  Laterality: N/A;   KNEE SURGERY     KNEE SURGERY Left    Patient Active Problem List   Diagnosis Date Noted   Pain in left hand 08/10/2023   Low vitamin D level 04/14/2023   Prediabetes 04/14/2023   Hypertension 04/11/2023   BMI 45.0-49.9, adult (HCC) 04/11/2023    ONSET DATE: 08/10/2023  REFERRING DIAG: N82.956 (ICD-10-CM) - Pain in left hand  THERAPY DIAG:  Pain in left hand  Muscle weakness (generalized)  Other lack of coordination  Other disturbances of skin sensation  Rationale for Evaluation and Treatment: Rehabilitation  SUBJECTIVE:   SUBJECTIVE STATEMENT: Pt reported that every night when she goes to sleep, she wakes up and her hand is numb and then sometimes her fingers will lock  down - especially the middle finger.  She reports that holding her phone in the L hand hurts too. She noted that she can be sitting watching TV and her hand will "just" draw up - it's happened a couple of times and there isn't really something that makes it happen.  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 She has a 37-month history of intermittent pain and what she describes as locking in her left long finger. She originally presented to the emergency room last June and she thought she may have had an injury at the night before. X-rays there did not reveal any abnormalities or fractures. She does not have any pain or locking today over the A1 pulley. She said sometimes it gets stuck in extension and she has more pain dorsally. Her exam today is benign however given the long time the symptoms of going on I recommended working with a hand therapist.  PRECAUTIONS: None  RED FLAGS: None   WEIGHT BEARING RESTRICTIONS: No  PAIN:  Are you having pain? Yes: NPRS scale: 4 at worst 10 (lasting a little while) Pain location: L hand Pain description: tingling sensation in all the fingers every morning (lasting about 5-10 minutes) Aggravating factors: it just happens, sleeping at night Relieving factors: nothing... has to shake it off/ignore it  FALLS: Has patient fallen in  last 6 months? No  LIVING ENVIRONMENT: Lives with: lives with their family - 4 children (9-16) and additional adult  Lives in: House/apartment Stairs: Yes: External: 3 steps; none Has following equipment at home: None  PLOF: Independent  PATIENT GOALS: get rid of the pain in her hand  OBJECTIVE:  Note: Objective measures were completed at Evaluation unless otherwise noted.  HAND DOMINANCE: Right  ADLs: WFL  FUNCTIONAL OUTCOME MEASURES: Quick Dash: 29.5  UPPER EXTREMITY ROM:     Generally WNL including full composite flexion  of digits although may be limited by stiffness or locking on occasion  HAND FUNCTION: Grip strength: Right: 64, 62, 80  lbs; Left: 20, 27, 21 lbs Average: Right 68.7 lbs Left 22.7 lbs  COORDINATION: TBA  SENSATION: She wakes up daily and her hand is numb.  EDEMA: Sometimes  COGNITION: Overall cognitive status: Within functional limits for tasks assessed  TREATMENT DATE:                                                                                                                             No treatment billed due to payor source but education initiated:  Pt issued tendon gliding exercises/handout with review of motions to isolate DIP, PIP and MCP joints for straight finger position, hook (DIP/PIP flexion), fist (DIP/PIP/MCP flexion), taco/duck (MCP flexion only) and flat fist (MCP and PIP flexion).   OT educated patient on sleep positioning as noted in patient instructions to reduce stress to upper extremity nerves, which could be attributing to reported paresthesias and pain in affected extremity. Patient encouraged to trya nd sleep with her hand braces at home. Patient verbalized understanding. Handout provided.  PATIENT EDUCATION: Education details: OT role and POC considerations Person educated: Patient Education method: Explanation Education comprehension: verbalized understanding and needs further education  HOME EXERCISE PROGRAM: 08/17/23 - Tendon Gliding Exercises & sleep positions  GOALS: Goals reviewed with patient? No  LONG TERM GOALS: Target date: 09/16/23  Patient will demonstrate updated L UE HEP with visual handouts only for proper execution.  Baseline: Issued Tendon Gliding exercises at Eval Goal status: IN Progress  2.  Patient will demonstrate at least 30 lbs LUE grip strength as needed to open jars and other containers. Baseline: 22.7 lbs and rates herself as severe difficulty opening a jar per QuickDash Goal status: INITIAL  3.  Pt will be  independent with splint wear and care for LUE to minimize tingling and numbness at night and decrease pain. Baseline: Daily numbness and pain at 4/10 at worst Goal status: INITIAL  4.  Pt will be independent with sleep positioning recommendations to minimize tingling and numbness of UEs. Baseline: Sleep position education initiated at eval Goal status: IN Progress  5.  Pt will independently recall at least 3 total joint protection, ergonomic, and body mechanic principles to minimize risk for pain and discomfort. Baseline: New to outpt OT Goal status: INITIAL   ASSESSMENT:  CLINICAL IMPRESSION: Patient  is a 43 y.o. female who was seen today for occupational therapy evaluation for L hand pain. Hx includes HTN, prediabetes, tobacco use and migraines. Patient currently presents with LUE pain and daily morning sensory changes affecting comfort and overall strength. Pt would benefit from skilled OT services in the outpatient setting to work on impairments noted to help pt return to PLOF as able.     PERFORMANCE DEFICITS: in functional skills including ADLs, IADLs, coordination, dexterity, sensation, edema, ROM, strength, pain, fascial restrictions, muscle spasms, flexibility, Fine motor control, Gross motor control, body mechanics, decreased knowledge of precautions, decreased knowledge of use of DME, and UE functional use,  and psychosocial skills including coping strategies, environmental adaptation, and habits.   IMPAIRMENTS: are limiting patient from ADLs, rest and sleep, work, leisure, and social participation.   COMORBIDITIES: may have co-morbidities  that affects occupational performance. Patient will benefit from skilled OT to address above impairments and improve overall function.  MODIFICATION OR ASSISTANCE TO COMPLETE EVALUATION: No modification of tasks or assist necessary to complete an evaluation.  OT OCCUPATIONAL PROFILE AND HISTORY: Problem focused assessment: Including review of  records relating to presenting problem.  CLINICAL DECISION MAKING: LOW - limited treatment options, no task modification necessary  REHAB POTENTIAL: Good  EVALUATION COMPLEXITY: Low      PLAN:  OT FREQUENCY: 1x/week  OT DURATION: up to 8 weeks but only planning for 4 weeks at this time  PLANNED INTERVENTIONS: 97535 self care/ADL training, 40981 therapeutic exercise, 97530 therapeutic activity, 97112 neuromuscular re-education, 97140 manual therapy, 97035 ultrasound, 97018 paraffin, 19147 fluidotherapy, 97010 moist heat, 97010 cryotherapy, 97034 contrast bath, 97760 Splinting (initial encounter), [id number] Subsequent splinting/medication, energy conservation, coping strategies training, patient/family education, and DME and/or AE instructions  RECOMMENDED OTHER SERVICES: NA  CONSULTED AND AGREED WITH PLAN OF CARE: Patient  PLAN FOR NEXT SESSION:  Review tendon gliding exercises Check on splint use Check on sleep positioning Progress HEP   Victorino Sparrow, OT 08/17/2023, 6:15 PM

## 2023-08-17 NOTE — Patient Instructions (Signed)
You can place your arms out to the sides with pillows underneath or place a pillow across your stomach with your hands resting on top for support. Hold a pillow with your arms away from your body.  Do not bend your arms at the elbows or tuck your hands under your head. Do not place your hand on top or underneath pillow. Use outside of pillow as a border.

## 2023-08-23 ENCOUNTER — Encounter: Payer: Self-pay | Admitting: Internal Medicine

## 2023-08-23 ENCOUNTER — Ambulatory Visit: Payer: Medicaid Other | Attending: Internal Medicine | Admitting: Internal Medicine

## 2023-08-23 VITALS — BP 134/80 | HR 75 | Temp 98.5°F | Ht 63.0 in | Wt 266.0 lb

## 2023-08-23 DIAGNOSIS — I10 Essential (primary) hypertension: Secondary | ICD-10-CM | POA: Diagnosis not present

## 2023-08-23 DIAGNOSIS — Z2821 Immunization not carried out because of patient refusal: Secondary | ICD-10-CM

## 2023-08-23 DIAGNOSIS — F17210 Nicotine dependence, cigarettes, uncomplicated: Secondary | ICD-10-CM | POA: Diagnosis not present

## 2023-08-23 DIAGNOSIS — Z5986 Financial insecurity: Secondary | ICD-10-CM | POA: Insufficient documentation

## 2023-08-23 DIAGNOSIS — E663 Overweight: Secondary | ICD-10-CM | POA: Diagnosis not present

## 2023-08-23 DIAGNOSIS — E559 Vitamin D deficiency, unspecified: Secondary | ICD-10-CM | POA: Insufficient documentation

## 2023-08-23 DIAGNOSIS — F17200 Nicotine dependence, unspecified, uncomplicated: Secondary | ICD-10-CM

## 2023-08-23 DIAGNOSIS — Z6841 Body Mass Index (BMI) 40.0 and over, adult: Secondary | ICD-10-CM | POA: Insufficient documentation

## 2023-08-23 DIAGNOSIS — Z760 Encounter for issue of repeat prescription: Secondary | ICD-10-CM | POA: Insufficient documentation

## 2023-08-23 DIAGNOSIS — Z5941 Food insecurity: Secondary | ICD-10-CM | POA: Diagnosis not present

## 2023-08-23 DIAGNOSIS — Z7689 Persons encountering health services in other specified circumstances: Secondary | ICD-10-CM

## 2023-08-23 DIAGNOSIS — E785 Hyperlipidemia, unspecified: Secondary | ICD-10-CM | POA: Insufficient documentation

## 2023-08-23 DIAGNOSIS — N939 Abnormal uterine and vaginal bleeding, unspecified: Secondary | ICD-10-CM | POA: Diagnosis not present

## 2023-08-23 DIAGNOSIS — E66813 Obesity, class 3: Secondary | ICD-10-CM

## 2023-08-23 MED ORDER — VARENICLINE TARTRATE 1 MG PO TABS: 1.0000 mg | ORAL_TABLET | Freq: Two times a day (BID) | ORAL | 1 refills | Status: DC

## 2023-08-23 MED ORDER — NAPROXEN 500 MG PO TABS: 500.0000 mg | ORAL_TABLET | Freq: Two times a day (BID) | ORAL | 0 refills | Status: DC | PRN

## 2023-08-23 MED ORDER — VARENICLINE TARTRATE (STARTER) 0.5 MG X 11 & 1 MG X 42 PO TBPK: ORAL_TABLET | ORAL | 0 refills | Status: DC

## 2023-08-23 NOTE — Patient Instructions (Signed)

## 2023-08-23 NOTE — Progress Notes (Signed)
 Patient ID: Gabriela Best, female    DOB: June 04, 1981  MRN: [id number]  CC: Establish Care (Est care / new pt. Med refill. /No questions / concerns/No to all vax)   Subjective: Gabriela Best is a 43 y.o. female who presents for est care visit. Her partner, Carlos, is with her. Her concerns today include:  Patient with history of HTN, HL, obesity, prediabetes, vitamin D  deficiency, tobacco dependence, abnormal uterine bleeding  Discussed the use of AI scribe software for clinical note transcription with the patient, who gave verbal consent to proceed.  History of Present Illness   The patient, with a history of hypertension, hyperlipidemia, vitamin D  deficiency, and abnormal uterine bleeding, presents to establish care after being referred from [facility].  Gives hx of HTN and HL. The patient's hypertension and hyperlipidemia are currently being managed without medication, with a focus on dietary changes.  She does have blood pressure device at home but has not checked it in the past 2 weeks.  Vit D def: The patient's vitamin D  deficiency was previously managed with high-dose weekly vitamin D , but she has since run out and has been taking a daily multivitamin.   AUB:  The patient's abnormal uterine bleeding is being managed with birth control pills, and she is considering a procedure to shrink uterine fibroids.   Tob dep:  The patient is a long-term smoker, smoking 3-5 cigarettes a day for over 20 years, and is considering quitting due to health and financial concerns.  She had quit in the past for 2 to 3 weeks.  At that time she quit cold turkey.  She is wanting to give a trial of quitting again.  Obesity: The patient is also overweight and has made some dietary changes, such as reducing intake of sweet drinks and increasing intake of fruits.  She works in home health care.  Does not getting any exercise outside of work.      Patient Active Problem List   Diagnosis Date  Noted   Pain in left hand 08/10/2023   Low vitamin D  level 04/14/2023   Prediabetes 04/14/2023   Hypertension 04/11/2023   BMI 45.0-49.9, adult (HCC) 04/11/2023     Current Outpatient Medications on File Prior to Visit  Medication Sig Dispense Refill   norethindrone  (AYGESTIN ) 5 MG tablet Take 1 tablet (5 mg total) by mouth daily. 60 tablet 6   ondansetron  (ZOFRAN ) 4 MG tablet Take 1 tablet (4 mg total) by mouth every 8 (eight) hours as needed for nausea or vomiting. 20 tablet 1   No current facility-administered medications on file prior to visit.    Allergies  Allergen Reactions   Lemon Juice [Lemon Oil] Swelling    Throat closes up   Lactose Intolerance (Gi)    Lime Oil Swelling    Throat closes up     Social History   Socioeconomic History   Marital status: Single    Spouse name: Not on file   Number of children: Not on file   Years of education: Not on file   Highest education level: Not on file  Occupational History   Not on file  Tobacco Use   Smoking status: Every Day    Current packs/day: 0.15    Types: Cigarettes   Smokeless tobacco: Never  Vaping Use   Vaping status: Never Used  Substance and Sexual Activity   Alcohol use: Yes    Comment: occ   Drug use: Never   Sexual activity:  Yes    Comment: same partner  Other Topics Concern   Not on file  Social History Narrative   Not on file   Social Drivers of Health   Financial Resource Strain: Medium Risk (08/23/2023)   Overall Financial Resource Strain (CARDIA)    Difficulty of Paying Living Expenses: Somewhat hard  Food Insecurity: Food Insecurity Present (08/23/2023)   Hunger Vital Sign    Worried About Running Out of Food in the Last Year: Sometimes true    Ran Out of Food in the Last Year: Never true  Transportation Needs: No Transportation Needs (08/23/2023)   PRAPARE - Administrator, Civil Service (Medical): No    Lack of Transportation (Non-Medical): No  Physical Activity: Inactive  (08/23/2023)   Exercise Vital Sign    Days of Exercise per Week: 0 days    Minutes of Exercise per Session: 0 min  Stress: Stress Concern Present (08/23/2023)   [employer] of Occupational Health - Occupational Stress Questionnaire    Feeling of Stress : To some extent  Social Connections: Moderately Isolated (08/23/2023)   Social Connection and Isolation Panel [NHANES]    Frequency of Communication with Friends and Family: More than three times a week    Frequency of Social Gatherings with Friends and Family: Once a week    Attends Religious Services: Never    Database Administrator or Organizations: No    Attends Banker Meetings: Never    Marital Status: Living with partner  Intimate Partner Violence: Not At Risk (08/23/2023)   Humiliation, Afraid, Rape, and Kick questionnaire    Fear of Current or Ex-Partner: No    Emotionally Abused: No    Physically Abused: No    Sexually Abused: No    Family History  Problem Relation Age of Onset   Breast cancer Maternal Grandmother     Past Surgical History:  Procedure Laterality Date   BREAST BIOPSY Left 01/14/2023   MM LT BREAST BX W LOC DEV 1ST LESION IMAGE BX SPEC STEREO GUIDE 01/14/2023 [facility] MAMMOGRAPHY   CHOLECYSTECTOMY N/A 11/13/2020   Procedure: LAPAROSCOPIC CHOLECYSTECTOMY WITH INTRAOPERATIVE CHOLANGIOGRAM;  Surgeon: Lyndel Deward PARAS, MD;  Location: [facility];  Service: General;  Laterality: N/A;   KNEE SURGERY     KNEE SURGERY Left     ROS: Review of Systems Negative except as stated above  PHYSICAL EXAM: BP 134/80 (BP Location: Left Arm, Patient Position: Sitting, Cuff Size: Large)   Pulse 75   Temp 98.5 F (36.9 C) (Oral)   Ht 5' 3 (1.6 m)   Wt 266 lb (120.7 kg)   LMP 07/07/2023 (Exact Date)   SpO2 100%   BMI 47.12 kg/m   Physical Exam BP 138/82 General appearance - alert, well appearing, morbidly obese middle-age African-American female and in no distress Mental status - normal mood, behavior,  speech, dress, motor activity, and thought processes Mouth - mucous membranes moist, pharynx normal without lesions Nostrils: No enlargement of nasal turbinates. Neck - supple, no significant adenopathy Chest - clear to auscultation, no wheezes, rales or rhonchi, symmetric air entry Heart - normal rate, regular rhythm, normal S1, S2, no murmurs, rubs, clicks or gallops Extremities - peripheral pulses normal, no pedal edema, no clubbing or cyanosis      08/23/2023    9:06 AM 08/11/2023    3:56 PM 08/05/2023    1:53 PM  Depression screen PHQ 2/9  Decreased Interest 0 1 0  Down, Depressed, Hopeless 1  1 1  PHQ - 2 Score 1 2 1   Altered sleeping 1 0   Tired, decreased energy 0 0   Change in appetite 1 0   Feeling bad or failure about yourself  1 1   Trouble concentrating 0 0   Moving slowly or fidgety/restless 0 0   Suicidal thoughts 0 0   PHQ-9 Score 4 3   Difficult doing work/chores Somewhat difficult         Latest Ref Rng & Units 07/26/2023    8:04 AM 05/08/2023   11:16 AM 04/11/2023   11:23 AM  CMP  Glucose 65 - 99 mg/dL 94  95  898   BUN 7 - 25 mg/dL 8  6  12    Creatinine 0.50 - 0.99 mg/dL 9.14  9.14  9.05   Sodium 135 - 146 mmol/L 138  138  139   Potassium 3.5 - 5.3 mmol/L 4.7  4.3  4.5   Chloride 98 - 110 mmol/L 106  104  106   CO2 20 - 32 mmol/L 27  22  22    Calcium 8.6 - 10.2 mg/dL 8.7  9.1  8.8   Total Protein 6.1 - 8.1 g/dL 6.2  6.6  6.3   Total Bilirubin 0.2 - 1.2 mg/dL 0.4  0.8  <9.7   Alkaline Phos 38 - 126 U/L  59  66   AST 10 - 30 U/L 10  13  12    ALT 6 - 29 U/L 8  11  8     Lipid Panel     Component Value Date/Time   CHOL 161 07/26/2023 0804   CHOL 155 04/11/2023 1123   TRIG 52 07/26/2023 0804   HDL 30 (L) 07/26/2023 0804   HDL 38 (L) 04/11/2023 1123   CHOLHDL 5.4 (H) 07/26/2023 0804   LDLCALC 117 (H) 07/26/2023 0804    CBC    Component Value Date/Time   WBC 8.0 07/26/2023 0804   RBC 4.26 07/26/2023 0804   HGB 11.3 (L) 07/26/2023 0804   HGB 11.8  04/11/2023 1123   HCT 35.6 07/26/2023 0804   HCT 36.7 04/11/2023 1123   PLT 381 07/26/2023 0804   PLT 396 04/11/2023 1123   MCV 83.6 07/26/2023 0804   MCV 85 04/11/2023 1123   MCH 26.5 (L) 07/26/2023 0804   MCHC 31.7 (L) 07/26/2023 0804   RDW 15.4 (H) 07/26/2023 0804   RDW 14.3 04/11/2023 1123   LYMPHSABS 2.9 01/26/2023 1935   MONOABS 0.4 01/26/2023 1935   EOSABS 448 07/26/2023 0804   BASOSABS 48 07/26/2023 0804    ASSESSMENT AND PLAN: 1. Establishing care with new doctor, encounter for (Primary)   2. Essential hypertension Blood pressure today is above goal.  Advised that normal blood pressure is 120/80 or lower.  Advised that she checks blood pressure at least twice a week and record readings.  Bring readings with her to see our nurse in several weeks.  If still above 130/80, we will start low-dose amlodipine . - Recheck vitals  3. Vitamin D  deficiency We will recheck vitamin D  level. - VITAMIN D  25 Hydroxy (Vit-D Deficiency, Fractures)  4. Tobacco dependence Pt is current smoker. Patient advised to quit smoking. Discussed health risks associated with smoking including lung and other types of cancers, chronic lung diseases and CV risks.. Pt ready to give trail of quitting.   Discussed methods to help quit including quitting cold turkey, use of NRT, Chantix  and Bupropion .  Pt wanting to try: Chantix .  Went over starter pack and then the continuation pack.  Prescription sent to the pharmacy.  First do the starter pack. _3_ Minutes spent on counseling. F/U: Assess progress on subsequent visit. - Varenicline  Tartrate, Starter, (CHANTIX  STARTING MONTH PAK) 0.5 MG X 11 & 1 MG X 42 TBPK; 0.5 mg PO daily day 1-3, then BID day 4-7 then 1 tab BID  Dispense: 53 each; Refill: 0 - varenicline  (CHANTIX  CONTINUING MONTH PAK) 1 MG tablet; Take 1 tablet (1 mg total) by mouth 2 (two) times daily.  Dispense: 60 tablet; Refill: 1  5. Morbid obesity (HCC) Dietary counseling given.  Advised to  eliminate sugary drinks from the diet.  Discussed snacking on fruits or nuts.  She is agreeable to seeing the nutritionist.  Encouraged her to get in some form of moderate intensity exercise with the goal of getting in 30 minutes 5 days a week.  Start low and go slow. - Amb ref to Medical Nutrition Therapy-MNT  6. Abnormal uterine bleeding Followed by GYN.  Looks like they plan to do a procedure on her in the near future to help shrink fibroids.  7. Pneumococcal vaccination declined Recommended.  Patient declined  8. Influenza vaccination declined Patient was given the opportunity to ask questions.  Patient verbalized understanding of the plan and was able to repeat key elements of the plan.   This documentation was completed using Paediatric nurse.  Any transcriptional errors are unintentional.  Orders Placed This Encounter  Procedures   VITAMIN D  25 Hydroxy (Vit-D Deficiency, Fractures)   Amb ref to Medical Nutrition Therapy-MNT   Recheck vitals     Requested Prescriptions   Signed Prescriptions Disp Refills   naproxen  (NAPROSYN ) 500 MG tablet 20 tablet 0    Sig: Take 1 tablet (500 mg total) by mouth 2 (two) times daily as needed.   Varenicline  Tartrate, Starter, (CHANTIX  STARTING MONTH PAK) 0.5 MG X 11 & 1 MG X 42 TBPK 53 each 0    Sig: 0.5 mg PO daily day 1-3, then BID day 4-7 then 1 tab BID   varenicline  (CHANTIX  CONTINUING MONTH PAK) 1 MG tablet 60 tablet 1    Sig: Take 1 tablet (1 mg total) by mouth 2 (two) times daily.    Return in about 4 months (around 12/21/2023) for BP check with RN in 3 wks.  Barnie Louder, MD, FACP

## 2023-08-24 ENCOUNTER — Encounter: Payer: Self-pay | Admitting: Internal Medicine

## 2023-08-24 ENCOUNTER — Ambulatory Visit: Payer: Medicaid Other | Attending: Family | Admitting: Occupational Therapy

## 2023-08-24 DIAGNOSIS — M6281 Muscle weakness (generalized): Secondary | ICD-10-CM | POA: Diagnosis present

## 2023-08-24 DIAGNOSIS — M79642 Pain in left hand: Secondary | ICD-10-CM | POA: Insufficient documentation

## 2023-08-24 DIAGNOSIS — R208 Other disturbances of skin sensation: Secondary | ICD-10-CM | POA: Diagnosis present

## 2023-08-24 DIAGNOSIS — R278 Other lack of coordination: Secondary | ICD-10-CM | POA: Diagnosis present

## 2023-08-24 LAB — VITAMIN D 25 HYDROXY (VIT D DEFICIENCY, FRACTURES): Vit D, 25-Hydroxy: 28.4 ng/mL — ABNORMAL LOW (ref 30.0–100.0)

## 2023-08-24 NOTE — Patient Instructions (Addendum)
 Access Code: [email or web address] URL: [email or web address] Date: 08/24/2023 Prepared by: Clarita Pride  Exercises - Putty Squeezes  - 1-2 x daily - 10 reps - Rolling Putty on Table  - 1-2 x daily - 10 reps - Finger Pinch and Pull with Putty  - 1-2 x daily - 10 reps - 3-Point Pinch with Putty  - 1-2 x daily - 10 reps - Tip PUSH with Putty  - 1-2 x daily - 10 reps - Key Pinch with Putty  - 1-2 x daily - 10 reps - Finger Extension with Putty  - 1-2 x daily - 10 reps - Finger Adduction with Putty  - 1-2 x daily - 10 reps - Removing Marbles from Putty  - 1-2 x daily - 10 reps

## 2023-08-24 NOTE — Therapy (Signed)
 OUTPATIENT OCCUPATIONAL THERAPY ORTHO TREATMENT  Patient Name: Gabriela Best MRN: [id number] DOB:05/04/1981, 43 y.o., female Today's Date: 08/24/2023  PCP: Leonarda Roxan BROCKS, NP REFERRING PROVIDER: Persons, Ronal Dragon, [place]  END OF SESSION:  OT End of Session - 08/24/23 1058     Visit Number 2    Number of Visits 5   including evaluation   Authorization Type [employer] Medicaid 2025    OT Start Time 1100    OT Stop Time 1144    OT Time Calculation (min) 44 min    Equipment Utilized During Treatment Yellow Putty    Activity Tolerance Patient tolerated treatment well    Behavior During Therapy WFL for tasks assessed/performed             Past Medical History:  Diagnosis Date   Abnormal menstrual cycle    Acute perforated appendicitis 11/14/2020   Migraine    Ovarian cyst    Symptomatic cholelithiasis 11/13/2020   Past Surgical History:  Procedure Laterality Date   BREAST BIOPSY Left 01/14/2023   MM LT BREAST BX W LOC DEV 1ST LESION IMAGE BX SPEC STEREO GUIDE 01/14/2023 [facility]   CHOLECYSTECTOMY N/A 11/13/2020   Procedure: LAPAROSCOPIC CHOLECYSTECTOMY WITH INTRAOPERATIVE CHOLANGIOGRAM;  Surgeon: Lyndel Deward PARAS, MD;  Location: [facility];  Service: General;  Laterality: N/A;   KNEE SURGERY     KNEE SURGERY Left    Patient Active Problem List   Diagnosis Date Noted   Vitamin D  deficiency 08/23/2023   Tobacco dependence 08/23/2023   Morbid obesity (HCC) 08/23/2023   Abnormal uterine bleeding 08/23/2023   Pain in left hand 08/10/2023   Low vitamin D  level 04/14/2023   Prediabetes 04/14/2023   Hypertension 04/11/2023   BMI 45.0-49.9, adult (HCC) 04/11/2023    ONSET DATE: 08/10/2023  REFERRING DIAG: F20.357 (ICD-10-CM) - Pain in left hand  THERAPY DIAG:  Pain in left hand  Muscle weakness (generalized)  Other lack of coordination  Other disturbances of skin sensation  Rationale for Evaluation and Treatment: Rehabilitation  SUBJECTIVE:   SUBJECTIVE  STATEMENT: Pt reported that she has been working on her UE positioning at night and has not been waking up with numbness every day - maybe 3-4 days/week.  She hasn't used her hand splint yet at night though.  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 She has a 41-month history of intermittent pain and what she describes as locking in her left long finger. She originally presented to the emergency room last June and she thought she may have had an injury at the night before. X-rays there did not reveal any abnormalities or fractures. She does not have any pain or locking today over the A1 pulley. She said sometimes it gets stuck in extension and she has more pain dorsally. Her exam today is benign however given the long time the symptoms of going on I recommended working with a hand therapist.  PRECAUTIONS: None  RED FLAGS: None   WEIGHT BEARING RESTRICTIONS: No  PAIN:  NA - fine today, woke up with a little pain due to hands falling off the pillows Are you having pain? Yes: NPRS scale: 4 at worst 10 (lasting a little while) Pain location: L hand Pain description: tingling sensation in all the fingers a few mornings (lasting about 5-10 minutes) Aggravating factors: it just happens, sleeping at night Relieving factors: nothing... has to shake it off/ignore it  FALLS: Has  patient fallen in last 6 months? No  LIVING ENVIRONMENT: Lives with: lives with their family - 4 children (9-16) and additional adult  Lives in: House/apartment Stairs: Yes: External: 3 steps; none Has following equipment at home: None  PLOF: Independent  PATIENT GOALS: get rid of the pain in her hand  OBJECTIVE:  Note: Objective measures were completed at Evaluation unless otherwise noted.  HAND DOMINANCE: Right  ADLs: WFL  FUNCTIONAL OUTCOME MEASURES: Quick Dash: 29.5  UPPER EXTREMITY ROM:     Generally WNL  including full composite flexion of digits although may be limited by stiffness or locking on occasion  HAND FUNCTION: Grip strength: Right: 64, 62, 80  lbs; Left: 20, 27, 21 lbs Average: Right 68.7 lbs Left 22.7 lbs  COORDINATION: TBA  SENSATION: She wakes up daily and her hand is numb.  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  No problems or concerns reported.   Initiated Putty Activities with yellow putty to begin strengthening, coordination and sensory stimulation of LUE particularly but with encouragement to use BUE to prevent overdoing it with L hand.  Patient provided visual demonstration, verbal and tactile cues as needed to improve performance of the various exercises/activities including:   - Putty Squeezes - cues to squeeze putty into log for use with other exercises and to fold putty in half with 1 hand  - Putty Rolls - encourage to roll putty into logs with sensory stimulation to entire length of hand, fingers and wrist as needed   - Pinch and Pull with Putty - this motion is combined with different pinches (3-Point Pinch, Tip Pinch, Key Pinch) - patient encouraged to combine tripod, pincer and/or key pinch with pinch and pull motion of putty pulling away from midline, changing between different pinches and changing different directions to change grip  -Finger Extension with Putty - pt shown how to work on task with all fingers and thumb as well as individual fingers in opposition to thumb   -Finger Adduction with Putty - pt shown how to weave a thin log of putty between her  fingers and pinch them together  - Removing Objects from Putty  - encouraged to hide items (coins, marble, dice etc) and use one hand at a time to find the objects digs them out with 1 hand if possible or 2 if necessary  OT educated patient on theraputty recommendations: avoid hot environments, place in designated container, avoid contact with fabrics. In addition, educated pt in time) 15 minutes or so for putty activities to prevent overdoing it.  Patient verbalized understanding.    Patient benefited from extra time, verbal/tactile cues, and modeling of task to allow time for processing of verbal instructions and improve motor planning of unfamiliar movements.   Self Care/Education:  OT educated pt on joint protection principles as noted in pt instructions as needed to improve UE pain.   Handout is provided regarding joint protection and patient is encouraged to consider the specific acronym LESS ie) less strain on joints  L: Listen to your body E: Energy Conservation S: Stronger Joints take the Lead S: Strategize   Patient encouraged to protect hands and wrist by  - Respecting for Pain and stopping activities before they reach the point of discomfort or pain  - Rest and Work Balance ie) balancing activities with appropriate rests during activity - Reduction of Effort - Use two hands instead of one if possible  - Use of Larger/Stronger Joints Ie) Lift or carry with the forearm or shoulder rather than fingers - Avoid Activities That Cannot Be Stopped - Use of Assistive Equipment - Consider splint use and AE equipment to protect joints from deformity and stresses Then reviewed activities that can be modified with adaptive equipment and encourage patient to look adaptive equipment as needed.  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 No  LONG TERM GOALS: Target date: 09/16/23  Patient will demonstrate updated L UE HEP with visual handouts only for proper execution.  Baseline: Issued Tendon Gliding exercises at Eval Goal status: IN Progress  2.  Patient will demonstrate at least 30 lbs LUE grip strength as needed to open jars and other containers. Baseline: 22.7 lbs and rates herself as severe difficulty opening a jar per QuickDash Goal status: IN Progress  3.  Pt will be independent with splint wear and care for LUE to minimize tingling and numbness at night and decrease pain. Baseline: Daily numbness and pain at 4/10 at worst Goal status: IN Progress  4.  Pt will be independent with sleep positioning recommendations to minimize tingling and numbness of UEs. Baseline: Sleep position education initiated at eval Goal status: IN Progress  5.  Pt will independently recall at least 3 total joint protection, ergonomic, and body mechanic principles to minimize risk for pain and discomfort. Baseline: New to outpt OT Goal status: IN Progress   ASSESSMENT:  CLINICAL IMPRESSION: Patient is a 43 y.o. female who was seen today for occupational therapy treatment for L hand pain. Patient presented with less LUE pain and numbness in the morning through use of sleep position education provided at initial evaluation. Pt will benefit from skilled OT services in the outpatient setting to work on impairments noted at eval to help pt return to [facility] as able.    PERFORMANCE DEFICITS: in functional skills including ADLs, IADLs, coordination, dexterity, sensation, edema, ROM, strength, pain, fascial restrictions, muscle  spasms, flexibility, Fine motor control, Gross motor control, body mechanics, decreased knowledge of precautions, decreased knowledge of use of DME, and UE functional use,  and psychosocial skills including coping strategies, environmental adaptation, and habits.   IMPAIRMENTS: are limiting patient from ADLs, rest and sleep, work, leisure, and social participation.  COMORBIDITIES: may have co-morbidities  that affects occupational performance. Patient will benefit from skilled OT to address above impairments and improve overall function.  REHAB POTENTIAL: Good  PLAN:  OT FREQUENCY: 1x/week  OT DURATION: up to 8 weeks but only planning for 4 weeks at this time  PLANNED INTERVENTIONS: 97535 self care/ADL training, 02889 therapeutic exercise, 97530 therapeutic activity, 97112 neuromuscular re-education, 97140 manual therapy, 97035 ultrasound, 97018 paraffin, 02960 fluidotherapy, 97010 moist heat, 97010 cryotherapy, 97034 contrast bath, 97760 Splinting (initial encounter), [id number] Subsequent splinting/medication, energy conservation, coping strategies training, patient/family education, and DME and/or AE instructions  RECOMMENDED OTHER SERVICES: NA  CONSULTED AND AGREED WITH PLAN OF CARE: Patient  PLAN FOR NEXT SESSION:  Review tendon gliding exercises/putty activities Check on splint use Check on sleep positioning Progress HEP   Clarita LITTIE Pride, OT 08/24/2023, 5:32 PM

## 2023-08-31 ENCOUNTER — Ambulatory Visit: Payer: Medicaid Other | Admitting: Occupational Therapy

## 2023-08-31 DIAGNOSIS — M79642 Pain in left hand: Secondary | ICD-10-CM

## 2023-08-31 DIAGNOSIS — R278 Other lack of coordination: Secondary | ICD-10-CM

## 2023-08-31 DIAGNOSIS — M6281 Muscle weakness (generalized): Secondary | ICD-10-CM

## 2023-08-31 NOTE — Therapy (Signed)
OUTPATIENT OCCUPATIONAL THERAPY ORTHO TREATMENT  Patient Name: Gabriela Best MRN: [id number] DOB:1981/06/09, 43 y.o., female Today's Date: 08/31/2023  PCP: Caesar Bookman, NP REFERRING PROVIDER: Persons, [place], [place]  END OF SESSION:  OT End of Session - 08/31/23 1101     Visit Number 3    Number of Visits 5   including evaluation   Authorization Type [employer] Medicaid 2025    OT Start Time 1102    OT Stop Time 1145    OT Time Calculation (min) 43 min    Activity Tolerance Patient tolerated treatment well    Behavior During Therapy [facility] for tasks assessed/performed             Past Medical History:  Diagnosis Date   Abnormal menstrual cycle    Acute perforated appendicitis 11/14/2020   Migraine    Ovarian cyst    Symptomatic cholelithiasis 11/13/2020   Past Surgical History:  Procedure Laterality Date   BREAST BIOPSY Left 01/14/2023   MM LT BREAST BX W LOC DEV 1ST LESION IMAGE BX SPEC STEREO GUIDE 01/14/2023 [facility]   CHOLECYSTECTOMY N/A 11/13/2020   Procedure: LAPAROSCOPIC CHOLECYSTECTOMY WITH INTRAOPERATIVE CHOLANGIOGRAM;  Surgeon: Quentin Ore, MD;  Location: [facility];  Service: General;  Laterality: N/A;   KNEE SURGERY     KNEE SURGERY Left    Patient Active Problem List   Diagnosis Date Noted   Vitamin D deficiency 08/23/2023   Tobacco dependence 08/23/2023   Morbid obesity (HCC) 08/23/2023   Abnormal uterine bleeding 08/23/2023   Pain in left hand 08/10/2023   Low vitamin D level 04/14/2023   Prediabetes 04/14/2023   Hypertension 04/11/2023   BMI 45.0-49.9, adult (HCC) 04/11/2023    ONSET DATE: 08/10/2023  REFERRING DIAG: N82.956 (ICD-10-CM) - Pain in left hand  THERAPY DIAG:  No diagnosis found.  Rationale for Evaluation and Treatment: Rehabilitation  SUBJECTIVE:   SUBJECTIVE STATEMENT: Pt reported that she has been continuing to work on UE positioning at night to help decrease numbness.  She still hasn't used her hand splint yet  at night though and is encouraged to bring them for OT to check for comfort.  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 She has a 17-month history of intermittent pain and what she describes as locking in her left long finger. She originally presented to the emergency room last June and she thought she may have had an injury at the night before. X-rays there did not reveal any abnormalities or fractures. She does not have any pain or locking today over the A1 pulley. She said sometimes it gets stuck in extension and she has more pain dorsally. Her exam today is benign however given the long time the symptoms of going on I recommended working with a hand therapist.  PRECAUTIONS: None  RED FLAGS: None   WEIGHT BEARING RESTRICTIONS: No  PAIN:  NA - fine today, woke up with a little pain due to hands falling off the pillows Are you having pain? Yes: NPRS scale:  Bearable today. 5-6 at worst 10 (lasting a little while) Pain location: L hand Pain description: tingling sensation in all the fingers a few mornings (lasting about 5-10 minutes) Aggravating factors: it just happens, sleeping at night Relieving factors: nothing... has to shake it off/ignore it Working the kinks out by moving it.  FALLS: Has patient fallen in last 6 months? No  LIVING ENVIRONMENT: Lives  with: lives with their family - 4 children (9-16) and additional adult  Lives in: House/apartment Stairs: Yes: External: 3 steps; none Has following equipment at home: None  PLOF: Independent  PATIENT GOALS: get rid of the pain in her hand  OBJECTIVE:  Note: Objective measures were completed at Evaluation unless otherwise noted.  HAND DOMINANCE: Right  ADLs: WFL  FUNCTIONAL OUTCOME MEASURES: Quick Dash: 29.5  UPPER EXTREMITY ROM:     Generally WNL including full composite flexion of digits although may be limited by stiffness or  locking on occasion  HAND FUNCTION: Grip strength: Right: 64, 62, 80  lbs; Left: 20, 27, 21 lbs Average: Right 68.7 lbs Left 22.7 lbs  COORDINATION: TBA  SENSATION: She wakes up daily and her hand is numb.  EDEMA: Sometimes  COGNITION: Overall cognitive status: Within functional limits for tasks assessed  TREATMENT DATE:                                                                                                                             Therapeutic Exercises:  Pt placed LUE in Fluidotherapy machine with supervised ROM x 10 min. Pt was educated to complete tendon gliding during modality time to improve ROM and decrease pain/stiffness of affected extremity by use of the machine's massaging action and thermal properties.   Wrist jux-a-cisor with use of left with cues to keep full grasp on base at all times x5 for improved wrist ROM and grip strength of affected extremity.  LUE gross grasp to pick up colored blocks and place into bowl with 15 pound hand grip for strengthening of affected extremity with attention to wrist position for max comfort and neutral positioning.  Pt engaged in wrist ROM exercises weighted flex bar for strength and endurance of affected extremity.  Attempted ROM with hammer but the varied weight of the head/handle was a bit too challenging.  ROM included wrist flex and extension, supination/pronation, radial/ulnar deviation. Pt able to perform all motions min difficulty.    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 No  LONG TERM GOALS: Target date: 09/16/23  Patient will demonstrate updated L UE HEP with visual handouts only for proper execution.  Baseline: Issued Tendon Gliding exercises at Eval Goal status: MET  2.  Patient will demonstrate at least 30 lbs LUE grip strength as needed to open jars and other containers. Baseline: 22.7 lbs and rates herself as severe difficulty opening a jar per QuickDash Goal status: IN Progress  3.  Pt will be independent with splint wear and care for LUE to minimize tingling and numbness at night and decrease  pain. Baseline: Daily numbness and pain at 4/10 at worst Goal status: IN Progress  4.  Pt will be independent with sleep positioning recommendations to minimize tingling and numbness of UEs. Baseline: Sleep position education initiated at eval Goal status: MET  5.  Pt will independently recall at least 3 total joint protection, ergonomic, and body mechanic principles to minimize risk for pain and discomfort. Baseline: New to outpt OT Goal status: IN Progress Positioning with pillows at night   ASSESSMENT:  CLINICAL IMPRESSION: Patient is a 43 y.o. female who was seen today for occupational therapy treatment for L hand pain. Patient presented with less LUE pain and numbness and responded well to fluidotherapy treatment today. Pt will benefit from skilled OT services in the outpatient setting to work on impairments noted at eval to help pt return to [facility] as able.    PERFORMANCE DEFICITS: in functional skills including ADLs, IADLs, coordination, dexterity, sensation, edema, ROM, strength, pain, fascial restrictions, muscle spasms, flexibility, Fine motor control, Gross motor control, body mechanics, decreased knowledge of precautions, decreased knowledge of use of DME, and UE functional use,  and psychosocial skills including coping strategies, environmental adaptation, and habits.   IMPAIRMENTS: are  limiting patient from ADLs, rest and sleep, work, leisure, and social participation.   COMORBIDITIES: may have co-morbidities  that affects occupational performance. Patient will benefit from skilled OT to address above impairments and improve overall function.  REHAB POTENTIAL: Good  PLAN:  OT FREQUENCY: 1x/week  OT DURATION: up to 8 weeks but only planning for 4 weeks at this time  PLANNED INTERVENTIONS: 97535 self care/ADL training, 91478 therapeutic exercise, 97530 therapeutic activity, 97112 neuromuscular re-education, 97140 manual therapy, 97035 ultrasound, 97018 paraffin, 29562 fluidotherapy, 97010 moist heat, 97010 cryotherapy, 97034 contrast bath, 97760 Splinting (initial encounter), [id number] Subsequent splinting/medication, energy conservation, coping strategies training, patient/family education, and DME and/or AE instructions  RECOMMENDED OTHER SERVICES: NA  CONSULTED AND AGREED WITH PLAN OF CARE: Patient  PLAN FOR NEXT SESSION:  Review tendon gliding exercises/putty activities, add flexbar Check on splint comfort/use Check on sleep positioning Progress HEP   Victorino Sparrow, OT 08/31/2023, 11:01 AM

## 2023-09-04 ENCOUNTER — Emergency Department (HOSPITAL_COMMUNITY)
Admission: EM | Admit: 2023-09-04 | Discharge: 2023-09-05 | Disposition: A | Discharge status: Home / Self Care | Payer: Medicaid Other | Attending: Student | Admitting: Student

## 2023-09-04 ENCOUNTER — Other Ambulatory Visit: Payer: Self-pay

## 2023-09-04 ENCOUNTER — Encounter (HOSPITAL_COMMUNITY): Payer: Self-pay | Admitting: *Deleted

## 2023-09-04 DIAGNOSIS — R1032 Left lower quadrant pain: Secondary | ICD-10-CM | POA: Insufficient documentation

## 2023-09-04 DIAGNOSIS — M545 Low back pain, unspecified: Secondary | ICD-10-CM | POA: Insufficient documentation

## 2023-09-04 DIAGNOSIS — R109 Unspecified abdominal pain: Secondary | ICD-10-CM

## 2023-09-04 LAB — HCG, SERUM, QUALITATIVE: Preg, Serum: NEGATIVE

## 2023-09-04 LAB — URINALYSIS, ROUTINE W REFLEX MICROSCOPIC
Bilirubin Urine: NEGATIVE
Glucose, UA: NEGATIVE mg/dL
Hgb urine dipstick: NEGATIVE
Ketones, ur: NEGATIVE mg/dL
Leukocytes,Ua: NEGATIVE
Nitrite: NEGATIVE
Protein, ur: NEGATIVE mg/dL
Specific Gravity, Urine: 1.024 (ref 1.005–1.030)
pH: 5 (ref 5.0–8.0)

## 2023-09-04 LAB — CBC
HCT: 35.8 % — ABNORMAL LOW (ref 36.0–46.0)
Hemoglobin: 11.2 g/dL — ABNORMAL LOW (ref 12.0–15.0)
MCH: 26.7 pg (ref 26.0–34.0)
MCHC: 31.3 g/dL (ref 30.0–36.0)
MCV: 85.4 fL (ref 80.0–100.0)
Platelets: 361 10*3/uL (ref 150–400)
RBC: 4.19 MIL/uL (ref 3.87–5.11)
RDW: 17.4 % — ABNORMAL HIGH (ref 11.5–15.5)
WBC: 9.8 10*3/uL (ref 4.0–10.5)
nRBC: 0 % (ref 0.0–0.2)

## 2023-09-04 LAB — COMPREHENSIVE METABOLIC PANEL
ALT: 13 U/L (ref 0–44)
AST: 14 U/L — ABNORMAL LOW (ref 15–41)
Albumin: 3.3 g/dL — ABNORMAL LOW (ref 3.5–5.0)
Alkaline Phosphatase: 57 U/L (ref 38–126)
Anion gap: 5 (ref 5–15)
BUN: 11 mg/dL (ref 6–20)
CO2: 25 mmol/L (ref 22–32)
Calcium: 8.4 mg/dL — ABNORMAL LOW (ref 8.9–10.3)
Chloride: 108 mmol/L (ref 98–111)
Creatinine, Ser: 0.89 mg/dL (ref 0.44–1.00)
GFR, Estimated: >60 mL/min (ref >60)
Glucose, Bld: 103 mg/dL — ABNORMAL HIGH (ref 70–99)
Potassium: 4 mmol/L (ref 3.5–5.1)
Sodium: 138 mmol/L (ref 135–145)
Total Bilirubin: 0.4 mg/dL (ref 0.0–1.2)
Total Protein: 6.2 g/dL — ABNORMAL LOW (ref 6.5–8.1)

## 2023-09-04 LAB — LIPASE, BLOOD: Lipase: 29 U/L (ref 11–51)

## 2023-09-04 NOTE — ED Triage Notes (Signed)
The pt is c/o abd and lower back pain for 2 days lmp last week normal  nauseated

## 2023-09-05 ENCOUNTER — Ambulatory Visit: Payer: Self-pay | Admitting: Licensed Clinical Social Worker

## 2023-09-05 ENCOUNTER — Emergency Department (HOSPITAL_COMMUNITY): Payer: Medicaid Other

## 2023-09-05 DIAGNOSIS — R1032 Left lower quadrant pain: Secondary | ICD-10-CM | POA: Diagnosis not present

## 2023-09-05 LAB — WET PREP, GENITAL
Clue Cells Wet Prep HPF POC: NONE SEEN
Sperm: NONE SEEN
Trich, Wet Prep: NONE SEEN
WBC, Wet Prep HPF POC: <10 (ref <10)
Yeast Wet Prep HPF POC: NONE SEEN

## 2023-09-05 LAB — GC/CHLAMYDIA PROBE AMP (~~LOC~~) NOT AT ARMC
Chlamydia: NEGATIVE
Comment: NEGATIVE
Comment: NORMAL
Neisseria Gonorrhea: NEGATIVE

## 2023-09-05 LAB — HIV ANTIBODY (ROUTINE TESTING W REFLEX): HIV Screen 4th Generation wRfx: NONREACTIVE

## 2023-09-05 LAB — RPR: RPR Ser Ql: NONREACTIVE

## 2023-09-05 MED ORDER — KETOROLAC TROMETHAMINE 30 MG/ML IJ SOLN
30.0000 mg | Freq: Once | INTRAMUSCULAR | Status: AC
  Administered 2023-09-05: 30 mg via INTRAVENOUS
  Filled 2023-09-05: qty 1

## 2023-09-05 MED ORDER — IOHEXOL 350 MG/ML SOLN
75.0000 mL | Freq: Once | INTRAVENOUS | Status: AC | PRN
  Administered 2023-09-05: 75 mL via INTRAVENOUS

## 2023-09-05 NOTE — Patient Outreach (Signed)
  Care Coordination   Follow Up Visit Note   09/05/2023 Name: Gabriela Best MRN: [id number] DOB: 1980/12/01  Gabriela Best is a 43 y.o. year old female who sees Marcine Matar, MD for primary care. I spoke with  Gabriela Best by phone today.  What matters to the patients health and wellness today?  Patient was d/c from hospital this morning. LCSW will r/s f/up appt    Goals Addressed             This Visit's Progress    Obtain Supportive Resources   On track    Activities and task to complete in order to accomplish goals.   Keep all upcoming appointments discussed today Continue with compliance of taking medication prescribed by Doctor Implement healthy coping skills discussed to assist with management of symptoms  Review supportive information on crisis intervention resources Continue working with [facility] LCSW to assist with management of symptoms         SDOH assessments and interventions completed:  No     Care Coordination Interventions:  Yes, provided  Interventions Today    Flowsheet Row Most Recent Value  Chronic Disease   Chronic disease during today's visit Hypertension (HTN)  General Interventions   General Interventions Discussed/Reviewed General Interventions Reviewed, Doctor Visits  [Pt was d/c from hospital this morning. LCSW will r/s f/up appt]  Doctor Visits Discussed/Reviewed Doctor Visits Reviewed       Follow up plan: Follow up call scheduled for 1-2 weeks    Encounter Outcome:  Patient Visit Completed   Jenel Lucks, LCSW [place]  [facility], [facility] Clinical Social Worker Direct Dial: [phone]  Fax: [phone] Website: [email or web address] 3:52 PM

## 2023-09-05 NOTE — Discharge Instructions (Signed)
Your workup tonight was reassuring.  Please follow-up with primary care and OB/GYN for further evaluation and management as needed.

## 2023-09-05 NOTE — Patient Instructions (Signed)
Visit Information  Thank you for taking time to visit with me today. Please don't hesitate to contact me if I can be of assistance to you.   Following are the goals we discussed today:   Goals Addressed             This Visit's Progress    Obtain Supportive Resources   On track    Activities and task to complete in order to accomplish goals.   Keep all upcoming appointments discussed today Continue with compliance of taking medication prescribed by Doctor Implement healthy coping skills discussed to assist with management of symptoms  Review supportive information on crisis intervention resources Continue working with [facility] LCSW to assist with management of symptoms         Our next appointment is by telephone on 3/3 at 3:30 PM  Please call the care guide team at [phone] if you need to cancel or reschedule your appointment.   If you are experiencing a Mental Health or Behavioral Health Crisis or need someone to talk to, please call the Suicide and Crisis Lifeline: 988 call 911   Patient verbalizes understanding of instructions and care plan provided today and agrees to view in MyChart. Active MyChart status and patient understanding of how to access instructions and care plan via MyChart confirmed with patient.     Windy Fast [facility]  [facility], [facility] Clinical Social Worker Direct Dial: [phone]  Fax: [phone] Website: [email or web address] 3:52 PM

## 2023-09-05 NOTE — ED Provider Notes (Signed)
[facility] AT [facility] Provider Note   CSN: [id number] Arrival date & time: 09/04/23  2132     History  Chief Complaint  Patient presents with   Abdominal Pain    Gabriela Best is a 43 y.o. female.  Patient presents emergency department complaining of left lower quadrant abdominal pain and bilateral low back pain for the past 2 days.  She states that she was "trying to get by at home" but the pain became too severe.  She endorses nausea but denies vomiting.  She states she has not had a bowel movement in a few days.  She does endorse vaginal discharge but no dysuria.  I asked the patient if she was sexually active and the patient was noncommittal.  She denies shortness of breath, chest pain, fevers.  Past medical history significant for hypertension, prediabetes, morbid obesity, migraines, cholecystectomy, appendicitis   Abdominal Pain      Home Medications Prior to Admission medications   Medication Sig Start Date End Date Taking? Authorizing Provider  naproxen (NAPROSYN) 500 MG tablet Take 1 tablet (500 mg total) by mouth 2 (two) times daily as needed. 08/23/23   Marcine Matar, MD  norethindrone (AYGESTIN) 5 MG tablet Take 1 tablet (5 mg total) by mouth daily. 08/11/23   Lorriane Shire, MD  ondansetron (ZOFRAN) 4 MG tablet Take 1 tablet (4 mg total) by mouth every 8 (eight) hours as needed for nausea or vomiting. 05/12/23   Lorriane Shire, MD  varenicline (CHANTIX CONTINUING MONTH PAK) 1 MG tablet Take 1 tablet (1 mg total) by mouth 2 (two) times daily. 08/23/23   Marcine Matar, MD  Varenicline Tartrate, Starter, (CHANTIX STARTING MONTH PAK) 0.5 MG X 11 & 1 MG X 42 TBPK 0.5 mg PO daily day 1-3, then BID day 4-7 then 1 tab BID 08/23/23   Marcine Matar, MD      Allergies    Lemon juice [lemon oil], Lactose intolerance (gi), and Lime oil    Review of Systems   Review of Systems  Gastrointestinal:  Positive for abdominal pain.     Physical Exam Updated Vital Signs BP (!) 146/69 (BP Location: Right Arm)   Pulse 62   Temp 98.3 F (36.8 C) (Oral)   Resp 18   Ht 5\' 3"  (1.6 m)   Wt 120.7 kg   LMP 07/07/2023 (Exact Date)   SpO2 100%   BMI 47.14 kg/m  Physical Exam Vitals and nursing note reviewed.  Constitutional:      General: She is not in acute distress.    Appearance: She is well-developed.  HENT:     Head: Normocephalic and atraumatic.  Eyes:     Conjunctiva/sclera: Conjunctivae normal.  Cardiovascular:     Rate and Rhythm: Normal rate and regular rhythm.  Pulmonary:     Effort: Pulmonary effort is normal. No respiratory distress.     Breath sounds: Normal breath sounds.  Abdominal:     Palpations: Abdomen is soft.     Tenderness: There is abdominal tenderness in the left lower quadrant.  Musculoskeletal:        General: No swelling.     Cervical back: Neck supple.  Skin:    General: Skin is warm and dry.     Capillary Refill: Capillary refill takes less than 2 seconds.  Neurological:     Mental Status: She is alert.  Psychiatric:        Mood and Affect: Mood normal.  ED Results / Procedures / Treatments   Labs (all labs ordered are listed, but only abnormal results are displayed) Labs Reviewed  COMPREHENSIVE METABOLIC PANEL - Abnormal; Notable for the following components:      Result Value   Glucose, Bld 103 (*)    Calcium 8.4 (*)    Total Protein 6.2 (*)    Albumin 3.3 (*)    AST 14 (*)    All other components within normal limits  CBC - Abnormal; Notable for the following components:   Hemoglobin 11.2 (*)    HCT 35.8 (*)    RDW 17.4 (*)    All other components within normal limits  URINALYSIS, ROUTINE W REFLEX MICROSCOPIC - Abnormal; Notable for the following components:   APPearance HAZY (*)    All other components within normal limits  WET PREP, GENITAL  LIPASE, BLOOD  HCG, SERUM, QUALITATIVE  HIV ANTIBODY (ROUTINE TESTING W REFLEX)  RPR  GC/CHLAMYDIA PROBE AMP  ([place]) NOT AT [facility]    EKG None  Radiology CT ABDOMEN PELVIS W CONTRAST Result Date: 09/05/2023 CLINICAL DATA:  Left lower quadrant abdominal pain. EXAM: CT ABDOMEN AND PELVIS WITH CONTRAST TECHNIQUE: Multidetector CT imaging of the abdomen and pelvis was performed using the standard protocol following bolus administration of intravenous contrast. RADIATION DOSE REDUCTION: This exam was performed according to the departmental dose-optimization program which includes automated exposure control, adjustment of the mA and/or kV according to patient size and/or use of iterative reconstruction technique. CONTRAST:  75mL OMNIPAQUE IOHEXOL 350 MG/ML SOLN COMPARISON:  Pelvis MRI 05/20/2023 FINDINGS: Lower chest:  No contributory findings. Hepatobiliary: No focal liver abnormality.Cholecystectomy. No biliary dilatation Pancreas: Unremarkable. Spleen: Unremarkable. Adrenals/Urinary Tract: Negative adrenals. No hydronephrosis or stone. Unremarkable bladder. Stomach/Bowel:  No obstruction. No appendicitis. Vascular/Lymphatic: No acute vascular abnormality. No mass or adenopathy. Reproductive:Enlarged uterus from fibroids as characterized by comparison pelvis MRI, no detected change. Other: No ascites or pneumoperitoneum. Musculoskeletal: No acute abnormalities. Lower thoracic disc space narrowing and ridging with multilevel lower thoracic foraminal narrowing. IMPRESSION: 1. No acute finding. 2. Known fibroids. Electronically Signed   By: Tiburcio Pea M.D.   On: 09/05/2023 04:58    Procedures Procedures    Medications Ordered in ED Medications  ketorolac (TORADOL) 30 MG/ML injection 30 mg (30 mg Intravenous Given 09/05/23 0400)  iohexol (OMNIPAQUE) 350 MG/ML injection 75 mL (75 mLs Intravenous Contrast Given 09/05/23 0430)    ED Course/ Medical Decision Making/ A&P                                 Medical Decision Making Amount and/or Complexity of Data Reviewed Labs: ordered. Radiology:  ordered.  Risk Prescription drug management.   This patient presents to the ED for concern of abdominal pain, this involves an extensive number of treatment options, and is a complaint that carries with it a high risk of complications and morbidity.  The differential diagnosis includes diverticulitis, colitis, musculoskeletal pain, ectopic pregnancy, others   Co morbidities that complicate the patient evaluation  History cholecystitis, appendicitis, abnormal menstrual cycle, ovarian cyst   Additional history obtained:   External records from outside source obtained and reviewed including internal medicine notes   Lab Tests:  I Ordered, and personally interpreted labs.  The pertinent results include: Wet prep with no abnormality, grossly unremarkable CMP, CBC, UA, lipase, negative pregnancy test   Imaging Studies ordered:  I ordered imaging studies including CT  abdomen pelvis with contrast I independently visualized and interpreted imaging which showed  1. No acute finding.  2. Known fibroids.   I agree with the radiologist interpretation   Problem List / ED Course / Critical interventions / Medication management   I ordered medication including Toradol for pain Reevaluation of the patient after these medicines showed that the patient improved I have reviewed the patients home medicines and have made adjustments as needed   Social Determinants of Health:  Patient has Medicaid for her primary health insurance type, is a tobacco smoker   Test / Admission - Considered:  Patient feeling much better after Toradol.  CT with no acute findings.  Lab work grossly unremarkable.  STI panel is still pending.  With wet prep having no bacteria I do not plan to start antibiotics at this time.  Question if patient's pain may be related to her underlying fibroids.  Plan to have patient follow-up with her OB/GYN for further evaluation as needed.  Patient discharged home in stable  condition with return precautions.         Final Clinical Impression(s) / ED Diagnoses Final diagnoses:  Abdominal pain, unspecified abdominal location    Rx / DC Orders ED Discharge Orders     None         Pamala Duffel 09/05/23 1610    Glendora Score, MD 09/05/23 2110

## 2023-09-05 NOTE — ED Notes (Signed)
 ED Provider at bedside.

## 2023-09-07 ENCOUNTER — Ambulatory Visit: Payer: Medicaid Other | Admitting: Occupational Therapy

## 2023-09-07 DIAGNOSIS — R278 Other lack of coordination: Secondary | ICD-10-CM

## 2023-09-07 DIAGNOSIS — M79642 Pain in left hand: Secondary | ICD-10-CM | POA: Diagnosis not present

## 2023-09-07 DIAGNOSIS — R208 Other disturbances of skin sensation: Secondary | ICD-10-CM

## 2023-09-07 DIAGNOSIS — M6281 Muscle weakness (generalized): Secondary | ICD-10-CM

## 2023-09-07 NOTE — Therapy (Signed)
OUTPATIENT OCCUPATIONAL THERAPY ORTHO TREATMENT  Patient Name: Gabriela Best MRN: [id number] DOB:10/15/1980, 43 y.o., female Today's Date: 09/07/2023  PCP: Caesar Bookman, NP REFERRING PROVIDER: Persons, [place], [place]  END OF SESSION:  OT End of Session - 09/07/23 1108     Visit Number 4    Number of Visits 5   including evaluation   Authorization Type [employer] Medicaid 2025    OT Start Time 1108    OT Stop Time 1153    OT Time Calculation (min) 45 min    Equipment Utilized During Treatment Yellow Flex Bar    Activity Tolerance Patient tolerated treatment well    Behavior During Therapy WFL for tasks assessed/performed             Past Medical History:  Diagnosis Date   Abnormal menstrual cycle    Acute perforated appendicitis 11/14/2020   Migraine    Ovarian cyst    Symptomatic cholelithiasis 11/13/2020   Past Surgical History:  Procedure Laterality Date   BREAST BIOPSY Left 01/14/2023   MM LT BREAST BX W LOC DEV 1ST LESION IMAGE BX SPEC STEREO GUIDE 01/14/2023 [facility] M[facility]MO[facility]   CHOLECYSTECTOMY N/A 11/13/2020   Procedure: LAPAROSCOPIC CHOLECYSTECTOMY WITH INTRAOPERATIVE CHOLANGIOGRAM;  Surgeon: Quentin Ore, MD;  Location: [facility] ORS;  Service: General;  Laterality: N/A;   KNEE SURGERY     KNEE SURGERY Left    Patient Active Problem List   Diagnosis Date Noted   Vitamin D deficiency 08/23/2023   Tobacco dependence 08/23/2023   Morbid obesity (HCC) 08/23/2023   Abnormal uterine bleeding 08/23/2023   Pain in left hand 08/10/2023   Low vitamin D level 04/14/2023   Prediabetes 04/14/2023   Hypertension 04/11/2023   BMI 45.0-49.9, adult (HCC) 04/11/2023    ONSET DATE: 08/10/2023  REFERRING DIAG: O13.086 (ICD-10-CM) - Pain in left hand  THERAPY DIAG:  Pain in left hand  Muscle weakness (generalized)  Other disturbances of skin sensation  Other lack of coordination  Rationale for Evaluation and Treatment: Rehabilitation  SUBJECTIVE:   SUBJECTIVE  STATEMENT: Pt brought her hand splint for OT to check for comfort today as she finds it squeezes her hand.  She reports her worst pain over the past week as 8/10 at least one morning and when carrying something with that hand on one occasion.  Her hand was feeling well today but she had taken pain medication for a different issue last night and this morning that helped her hand also.  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 She has a 89-month history of intermittent pain and what she describes as locking in her left long finger. She originally presented to the emergency room last June and she thought she may have had an injury at the night before. X-rays there did not reveal any abnormalities or fractures. She does not have any pain or locking today over the A1 pulley. She said sometimes it gets stuck in extension and she has more pain dorsally. Her exam today is benign however given the long time the symptoms of going on I recommended working with a hand therapist.  PRECAUTIONS: None  RED FLAGS: None   WEIGHT BEARING RESTRICTIONS: No  PAIN:  Are you having pain? Not this morning - took pain last night and this morning (for different issue) and it helped everything including her hand   Yes: NPRS scale:  8 at worst 10 (lasting  a little while) Pain location: L hand Pain description: tingling sensation in all the fingers a few mornings (lasting about 5-10 minutes) Aggravating factors: it just happens, sleeping at night and carrying a bag this past week Relieving factors: nothing... has to shake it off/ignore it Working the kinks out by moving it.  FALLS: Has patient fallen in last 6 months? No  LIVING ENVIRONMENT: Lives with: lives with their family - 4 children (9-16) and additional adult  Lives in: House/apartment Stairs: Yes: External: 3 steps; none Has following equipment at home: None  PLOF:  Independent  PATIENT GOALS: get rid of the pain in her hand  OBJECTIVE:  Note: Objective measures were completed at Evaluation unless otherwise noted.  HAND DOMINANCE: Right  ADLs: WFL  FUNCTIONAL OUTCOME MEASURES: Quick Dash: 29.5  UPPER EXTREMITY ROM:     Generally WNL including full composite flexion of digits although may be limited by stiffness or locking on occasion  HAND FUNCTION: Grip strength: Right: 64, 62, 80  lbs; Left: 20, 27, 21 lbs Average: Right 68.7 lbs Left 22.7 lbs  COORDINATION: TBA  SENSATION: She wakes up daily and her hand is numb.  EDEMA: Sometimes  COGNITION: Overall cognitive status: Within functional limits for tasks assessed  TREATMENT DATE:                                                                                                                             Orthotic Management: OT checked pt's pre fab thumb spica splint and noted it did appear snug across her hand.  Trial of different splint in office revealed increased comfort.  OTR was then able to remove the 4 metal stays in her splint from home (lateral, medial, volar and thumb based stays).  Upon removal of stays, pt reported increased comfort and no issues s/p wearing splint for several minutes during additional education.  Pt also supplied with stockinette to protect hand during splint wear.   Self Care: OT educated patient on use of paraffin bath.  Patient was informed of risks and benefits to treatment today and in agreement with trial.  Pt tested temperature prior to dipping L hand and no concerns noted.  Patient tolerated paraffin bath to L hands with a towel wrap for 10 minutes during education re: AE and jt protection principles.  Paraffin Bath was performed in preparation for ROM, strength and functional activities AND as trial for home modality consideration.  No redness, irritation or skin integrity concerns were noted before, during and/or after use.   Skin integrity prior  to treatment: Intact. Skin integrity after treatment: Intact.  Information provided about possible home home modality equipment options for paraffin as well as reusable hand warmers.  Reviewed education re: pt re: pain management options ie) heat pad verus paraffin (with options located on Amazon for consideration).  Pt does used disposable hand warmers also at home.  Reviewed joint protection in light of discomfort with carrying object this  week ie) improving hand/wrist position to avoid tight grasp and use larger/stronger proximal arm muscles. Finally reviewed sleep positions with patient with encouragement to wear splint s/p modifications made today to try and decrease sensory changes noted frequently in the morning.   Therapeutic Exercises: Pt engaged in hand/wrist ROM exercises with yellow flex bar for strength and endurance of affected extremity.   ROM included wrist flex and extension, supination/pronation with some isometric exercises for increased comfort of hand. Pt able to perform all motions min difficulty.    PATIENT EDUCATION: Education details: Splint, Primary school teacher, Heat options Person educated: Patient Education method: Explanation, Demonstration, Tactile cues, and Verbal cues Education comprehension: verbalized understanding, returned demonstration, and verbal cues required  HOME EXERCISE PROGRAM: 08/17/23 - Tendon Gliding Exercises & sleep positions 08/24/23 - Putty Activities Access Code: [email or web address] and Joint protection  GOALS: Goals reviewed with patient? No  LONG TERM GOALS: Target date: 09/16/23  Patient will demonstrate updated L UE HEP with visual handouts only for proper execution.  Baseline: Issued Tendon Gliding exercises at Eval Goal status: MET  2.  Patient will demonstrate at least 30 lbs LUE grip strength as needed to open jars and other containers. Baseline: 22.7 lbs and rates herself as severe difficulty opening a jar per QuickDash Goal status: IN  Progress  3.  Pt will be independent with splint wear and care for LUE to minimize tingling and numbness at night and decrease pain. Baseline: Daily numbness and pain at 4/10 at worst Goal status: IN Progress  4.  Pt will be independent with sleep positioning recommendations to minimize tingling and numbness of UEs. Baseline: Sleep position education initiated at eval Goal status: MET  5.  Pt will independently recall at least 3 total joint protection, ergonomic, and body mechanic principles to minimize risk for pain and discomfort. Baseline: New to outpt OT Goal status: IN Progress Positioning with pillows at night   ASSESSMENT:  CLINICAL IMPRESSION: Patient is a 43 y.o. female who was seen today for occupational therapy treatment for L hand pain. Patient presented with less LUE pain and numbness and responded well to fluidotherapy treatment last week and trial of paraffin today. Pt will benefit from skilled OT services in the outpatient setting to work on impairments noted at eval to help pt return to [facility] as able.    PERFORMANCE DEFICITS: in functional skills including ADLs, IADLs, coordination, dexterity, sensation, edema, ROM, strength, pain, fascial restrictions, muscle spasms, flexibility, Fine motor control, Gross motor control, body mechanics, decreased knowledge of precautions, decreased knowledge of use of DME, and UE functional use,  and psychosocial skills including coping strategies, environmental adaptation, and habits.   IMPAIRMENTS: are limiting patient from ADLs, rest and sleep, work, leisure, and social participation.   COMORBIDITIES: may have co-morbidities  that affects occupational performance. Patient will benefit from skilled OT to address above impairments and improve overall function.  REHAB POTENTIAL: Good  PLAN:  OT FREQUENCY: 1x/week  OT DURATION: up to 8 weeks but only planning for 4 weeks at this time  PLANNED INTERVENTIONS: 97535 self care/ADL  training, 46962 therapeutic exercise, 97530 therapeutic activity, 97112 neuromuscular re-education, 97140 manual therapy, 97035 ultrasound, 97018 paraffin, 95284 fluidotherapy, 97010 moist heat, 97010 cryotherapy, 97034 contrast bath, 97760 Splinting (initial encounter), [id number] Subsequent splinting/medication, energy conservation, coping strategies training, patient/family education, and DME and/or AE instructions  RECOMMENDED OTHER SERVICES: NA  CONSULTED AND AGREED WITH PLAN OF CARE: Patient  PLAN FOR NEXT SESSION:  Check for need to schedule  additional appts (up to 8 per POC if necessary) Review tendon gliding exercises/putty activities, add flexbar Check on splint comfort/use Check on sleep positioning Progress HEP   Victorino Sparrow, OT 09/07/2023, 12:39 PM

## 2023-09-08 ENCOUNTER — Encounter: Payer: Self-pay | Admitting: General Practice

## 2023-09-12 ENCOUNTER — Encounter: Payer: Self-pay | Admitting: Dietician

## 2023-09-12 ENCOUNTER — Encounter: Payer: Medicaid Other | Attending: Internal Medicine | Admitting: Dietician

## 2023-09-12 NOTE — Patient Instructions (Addendum)
 Add a cup of fat free greek yogurt to your smoothie for extra protein (Oikos Triple Zero, Danon Light n' Fit). Mix and match your fruits with the different yogurt flavors, and use only 1-2 cups of fruit per batch.  Choose sugar free beverages as often as possible. You can have ZERO sugar sodas or fruit punch.  Look into your fitness/dancing classes at the [facility] for a fun, group activity.  Work towards eating three meals a day, about 5-6 hours apart!  Begin to recognize carbohydrates, proteins, and non-starchy vegetables in your food choices!  Begin to build your meals using the proportions of the Balanced Plate. First, select your carb choice(s) for the meal. Make this 25% of your meal. Next, select your source of protein to pair with your carb choice(s). Make this another 25% of your meal. Finally, complete your meal with a variety of non-starchy vegetables. Make this the remaining 50% of your meal.

## 2023-09-12 NOTE — Progress Notes (Signed)
 Medical Nutrition Therapy  Appointment Start time:  0930  Appointment End time:  1035  Primary concerns today: Weight Loss  Referral diagnosis: E66.01 - Morbid Obesity Preferred learning style: No preference indicated Learning readiness: Contemplating   NUTRITION ASSESSMENT   Anthropometrics Ht: 62" Wt: 268.0 lbs BMI: 49.02 kg/m2  Clinical Medical Hx: Morbid obesity, HTN, Prediabetes, Vit D deficiency Medications: Chantix, Aygestin Labs: Vit D - 28.4, Calcium - 8.4, Total Protein - 6.2, Albumin - 3.3, Hgb - 11.2 Notable Signs/Symptoms: N/A   Lifestyle & Dietary Hx Pt is accompanied by [id number] for appointment. Pt reports goal of weight loss, states they will be fasting for a month starting on March 1st for a month, smoothies before sunrise, and a salad after sundown. Pt reports currently usually only eating twice a day, states they tend to over consume at dinner, may make a smoothie for breakfast (strawberry, grapes, kiwi, oranges, watermelon, pineapple). Pt reports having 4 kids (10, 11, 14, 16) in the house, states the kids eat a lot of the food and occasionally they will run out of food. Pt reports going to food pantries regularly, and is receiving very little in SNAP benefits. 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   Estimated daily fluid intake: <65 oz Supplements: MVI Sleep: Tosses and turns. Stress / self-care: Home, varies  Current average weekly physical activity: ADLs   24-Hr Dietary Recall First Meal: Steak, egg, cheese bagel, Chicken McGriddle Snack:  Second Meal: Seafood Boil (crab, mussels, shrimp, crawfish, potatoes, corn, garlic) Snack:  Third Meal: 2 pieces of [employer] chicken, Mac and Cheese Snack:  Beverages: Fruit punch, water, Pepsi   NUTRITION DIAGNOSIS  NB-1.1 Food and nutrition-related knowledge deficit As related to obesity.  As evidenced by BMI of 49.02 kg/m2,  sedentary lifestyle, dietary recall high in energy dense foods.   NUTRITION INTERVENTION  Nutrition education (E-1) on the following topics:  Educated patient on the two components of energy balance: Energy in (calories), and energy out (activity). Explain the role of negative energy balance in weight loss. Discussed options with patient to achieve a negative energy balance and how to best control energy in and energy out to accommodate their lifestyle.   Handouts Provided Include  Food Assistance Balanced Plate  [facility] Activity  Learning Style & Readiness for Change Teaching method utilized: Visual & Auditory  Demonstrated degree of understanding via: Teach Back  Barriers to learning/adherence to lifestyle change: Low SES   Goals Established by Pt Add a cup of fat free greek yogurt to your smoothie for extra protein (Oikos Triple Zero, Danon Light n' Fit). Mix and match your fruits with the different yogurt flavors, and use only 1-2 cups of fruit per batch. Choose sugar free beverages as often as possible. You can have ZERO sugar sodas or fruit punch. Look into your fitness/dancing classes at the [facility] for a fun, group activity. Work towards eating three meals a day, about 5-6 hours apart! Begin to recognize carbohydrates, proteins, and non-starchy vegetables in your food choices! Begin to build your meals using the proportions of the Balanced Plate. First, select your carb choice(s) for the meal. Make this 25% of your meal. Next, select your source of protein to pair with your carb choice(s). Make this another 25% of your meal. Finally, complete your meal with a variety of non-starchy vegetables. Make this the remaining 50% of your meal.   MONITORING &  EVALUATION Dietary intake, weekly physical activity, and weight change in 6-8 weeks.  Next Steps  Patient is to follow up with RD.

## 2023-09-13 ENCOUNTER — Ambulatory Visit: Payer: Medicaid Other | Attending: Family Medicine

## 2023-09-13 DIAGNOSIS — I10 Essential (primary) hypertension: Secondary | ICD-10-CM | POA: Diagnosis not present

## 2023-09-13 NOTE — Progress Notes (Signed)
   Blood Pressure Recheck Visit  Name: Gabriela Best MRN: [id number] Date of Birth: May 28, 1981  [name] presents today for Blood Pressure recheck with clinical support staff.  Order for BP recheck by Dr. Laural Benes, ordered on 09/13/2023.   BP Readings from Last 3 Encounters:  09/13/23 130/76  09/05/23 (!) 160/83  08/23/23 134/80    Current Outpatient Medications  Medication Sig Dispense Refill   naproxen (NAPROSYN) 500 MG tablet Take 1 tablet (500 mg total) by mouth 2 (two) times daily as needed. 20 tablet 0   norethindrone (AYGESTIN) 5 MG tablet Take 1 tablet (5 mg total) by mouth daily. 60 tablet 6   ondansetron (ZOFRAN) 4 MG tablet Take 1 tablet (4 mg total) by mouth every 8 (eight) hours as needed for nausea or vomiting. 20 tablet 1   varenicline (CHANTIX CONTINUING MONTH PAK) 1 MG tablet Take 1 tablet (1 mg total) by mouth 2 (two) times daily. 60 tablet 1   Varenicline Tartrate, Starter, (CHANTIX STARTING MONTH PAK) 0.5 MG X 11 & 1 MG X 42 TBPK 0.5 mg PO daily day 1-3, then BID day 4-7 then 1 tab BID 53 each 0   No current facility-administered medications for this visit.    Hypertensive Medication Review: Patient is not currently taking hypertensive medications Provider Recommendation:  Spoke to Dr. Laural Benes and they stated: Start Norvasc 5 mg daily if BP >130/80  Patient BP does not fit criteria per PCP instructions to start Hypertensive medication on today Visit. Patient to schedule follow-up with PCP in 4 months.  Patient has been scheduled to follow up with PCP   Patient has been given provider's recommendations and does not have any questions or concerns at this time. Patient will contact the office for any future questions or concerns.

## 2023-09-14 ENCOUNTER — Ambulatory Visit: Payer: Medicaid Other | Admitting: Occupational Therapy

## 2023-09-19 ENCOUNTER — Ambulatory Visit: Payer: Self-pay | Admitting: Licensed Clinical Social Worker

## 2023-09-20 NOTE — Patient Instructions (Signed)
 Visit Information  Thank you for taking time to visit with me today. Please don't hesitate to contact me if I can be of assistance to you.   Following are the goals we discussed today:   Goals Addressed             This Visit's Progress    Obtain Supportive Resources   On track    Activities and task to complete in order to accomplish goals.   Keep all upcoming appointments discussed today Continue with compliance of taking medication prescribed by Doctor Implement healthy coping skills discussed to assist with management of symptoms  Review supportive information on crisis intervention resources Continue working with IBH LCSW to assist with management of symptoms         Our next appointment is by telephone on 4/13 at 3:30 PM  Please call the care guide team at [phone] if you need to cancel or reschedule your appointment.   If you are experiencing a Mental Health or Behavioral Health Crisis or need someone to talk to, please call the Suicide and Crisis Lifeline: 988 call 911   Patient verbalizes understanding of instructions and care plan provided today and agrees to view in MyChart. Active MyChart status and patient understanding of how to access instructions and care plan via MyChart confirmed with patient.     Windy Fast [facility]  [facility], [facility] Clinical Social Worker Direct Dial: [phone]  Fax: [phone] Website: [email or web address] 3:59 PM

## 2023-09-20 NOTE — Patient Outreach (Signed)
 Care Coordination   Follow Up Visit Note   09/19/2023 Name: Gabriela Best MRN: [id number] DOB: Oct 09, 1980  Gabriela Best is a 43 y.o. year old female who sees Marcine Matar, MD for primary care. I spoke with  Trevor Iha by phone today.  What matters to the patients health and wellness today?  Symptom Management, Goal Setting    Goals Addressed             This Visit's Progress    Obtain Supportive Resources   On track    Activities and task to complete in order to accomplish goals.   Keep all upcoming appointments discussed today Continue with compliance of taking medication prescribed by Doctor Implement healthy coping skills discussed to assist with management of symptoms  Review supportive information on crisis intervention resources Continue working with IBH LCSW to assist with management of symptoms         SDOH assessments and interventions completed:  No     Care Coordination Interventions:  Yes, provided  Interventions Today    Flowsheet Row Most Recent Value  Chronic Disease   Chronic disease during today's visit Hypertension (HTN)  General Interventions   General Interventions Discussed/Reviewed General Interventions Reviewed, Doctor Visits  [Patient reports that she was excited that she was not prescribed blood pressure medication. Discussed strategies she is implementing to assist with management of BP]  Doctor Visits Discussed/Reviewed Doctor Visits Reviewed  Mental Health Interventions   Mental Health Discussed/Reviewed Mental Health Reviewed  [LCSW discussed strategies to assist with setting SMART goals]  Nutrition Interventions   Nutrition Discussed/Reviewed Nutrition Reviewed  [Patient reports modifying diet to meet weight goals. Discussed various ways to celebrate non-scale victories to maintain motivation]  Pharmacy Interventions   Pharmacy Dicussed/Reviewed Pharmacy Topics Reviewed       Follow up plan: Follow up call scheduled for 4-6  weeks    Encounter Outcome:  Patient Visit Completed   Jenel Lucks, LCSW [place]  [facility], [facility] Clinical Social Worker Direct Dial: [phone]  Fax: [phone] Website: [email or web address] 3:59 PM'

## 2023-09-21 ENCOUNTER — Ambulatory Visit: Payer: Medicaid Other | Admitting: Occupational Therapy

## 2023-09-28 ENCOUNTER — Ambulatory Visit: Attending: Family | Admitting: Occupational Therapy

## 2023-09-28 DIAGNOSIS — R278 Other lack of coordination: Secondary | ICD-10-CM | POA: Diagnosis present

## 2023-09-28 DIAGNOSIS — M79642 Pain in left hand: Secondary | ICD-10-CM | POA: Insufficient documentation

## 2023-09-28 DIAGNOSIS — M6281 Muscle weakness (generalized): Secondary | ICD-10-CM | POA: Insufficient documentation

## 2023-09-28 DIAGNOSIS — R208 Other disturbances of skin sensation: Secondary | ICD-10-CM | POA: Diagnosis present

## 2023-09-28 NOTE — Therapy (Signed)
 OUTPATIENT OCCUPATIONAL THERAPY ORTHO TREATMENT & DISCHARGE SUMMARY  Patient Name: Gabriela Best MRN: [id number] DOB:[birth year], 43 y.o., female Today's Date: 09/28/2023  PCP: Caesar Bookman, NP REFERRING PROVIDER: Persons, [place], [place]  END OF SESSION:  OT End of Session - 09/28/23 0848     Visit Number 5    Number of Visits 5   including evaluation   Authorization Type [employer] Medicaid 2025    OT Start Time 0849    OT Stop Time 0932    OT Time Calculation (min) 43 min    Equipment Utilized During Treatment Red theraband    Activity Tolerance Patient tolerated treatment well    Behavior During Therapy [facility] for tasks assessed/performed             Past Medical History:  Diagnosis Date   Abnormal menstrual cycle    Acute perforated appendicitis 11/14/2020   Migraine    Ovarian cyst    Symptomatic cholelithiasis 11/13/2020   Past Surgical History:  Procedure Laterality Date   BREAST BIOPSY Left 01/14/2023   MM LT BREAST BX W LOC DEV 1ST LESION IMAGE BX SPEC STEREO GUIDE 01/14/2023 [facility] MAMMOGRAPHY   CHOLECYSTECTOMY N/A 11/13/2020   Procedure: LAPAROSCOPIC CHOLECYSTECTOMY WITH INTRAOPERATIVE CHOLANGIOGRAM;  Surgeon: Quentin Ore, MD;  Location: [facility] ORS;  Service: General;  Laterality: N/A;   KNEE SURGERY     KNEE SURGERY Left    Patient Active Problem List   Diagnosis Date Noted   Vitamin D deficiency 08/23/2023   Tobacco dependence 08/23/2023   Morbid obesity (HCC) 08/23/2023   Abnormal uterine bleeding 08/23/2023   Pain in left hand 08/10/2023   Low vitamin D level 04/14/2023   Prediabetes 04/14/2023   Hypertension 04/11/2023   BMI 45.0-49.9, adult (HCC) 04/11/2023    ONSET DATE: 08/10/2023  REFERRING DIAG: F62.130 (ICD-10-CM) - Pain in left hand  THERAPY DIAG:  Pain in left hand  Muscle weakness (generalized)  Other disturbances of skin sensation  Other lack of coordination  Rationale for Evaluation and Treatment:  Rehabilitation  SUBJECTIVE:   SUBJECTIVE STATEMENT:  Pt had a pain through her hand yesterday while she was getting up but it went away after awhile.  Overall, pt is doing better, is using the splint at night although it's not too comfortable and is only taking pain med once and awhile.  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 She has a 28-month history of intermittent pain and what she describes as locking in her left long finger. She originally presented to the emergency room last June and she thought she may have had an injury at the night before. X-rays there did not reveal any abnormalities or fractures. She does not have any pain or locking today over the A1 pulley. She said sometimes it gets stuck in extension and she has more pain dorsally. Her exam today is benign however given the long time the symptoms of going on I recommended working with a hand therapist.  PRECAUTIONS: None  RED FLAGS: None   WEIGHT BEARING RESTRICTIONS: No  PAIN:  Are you having pain? Not this morning   Yes: NPRS scale:  6 at worst 10 (lasting a little while) Pain location: L hand Pain description: sharp pain in hand Aggravating factors: it just happens, sleeping at night Relieving factors: nothing... has to shake it off/ignore it Working the kinks out by moving it.  FALLS: Has patient fallen  in last 6 months? No  LIVING ENVIRONMENT: Lives with: lives with their family - 4 children (9-16) and additional adult  Lives in: House/apartment Stairs: Yes: External: 3 steps; none Has following equipment at home: None  PLOF: Independent  PATIENT GOALS: get rid of the pain in her hand  OBJECTIVE:  Note: Objective measures were completed at Evaluation unless otherwise noted.  HAND DOMINANCE: Right  ADLs: WFL  FUNCTIONAL OUTCOME MEASURES: Eval: Quick Dash: 29.5 Discharge: QuickDash: 9.1  UPPER EXTREMITY  ROM:     Generally WNL including full composite flexion of digits although may be limited by stiffness or locking on occasion  HAND FUNCTION: Grip strength: Right: 64, 62, 80  lbs; Left: 20, 27, 21 lbs Average: Right 68.7 lbs Left 22.7 lbs  09/28/23 Right: 72, 70, 80 - Average 74 lbs Left: 45, 41, 60 - Average 46.7 lbs  COORDINATION: TBA  SENSATION: She wakes up daily and her hand is numb.  09/28/23 - Significantly improved, not daily, has been working on sleep position to decrease pain and tingling  EDEMA: Sometimes  COGNITION: Overall cognitive status: Within functional limits for tasks assessed  TREATMENT DATE:                                                                                                                             Self Care: OT educated pt on joint protection principles as noted in pt instructions as needed to improve UE pain.   Handout is provided regarding joint protection and patient is encouraged to consider the specific acronym "LESS" ie) less strain on joints  L: Listen to your body E: Energy Conservation S: Stronger Joints take the Lead S: Strategize   Patient encouraged to protect hands and wrist by  - Respecting for Pain and stopping activities before they reach the point of discomfort or pain  - Rest and Work Balance ie) balancing activities with appropriate rests during activity - Reduction of Effort - Use two hands instead of one if possible  - Use of Larger/Stronger Joints Ie) Lift or carry with the forearm or shoulder rather than fingers - Avoid Activities That Cannot Be Stopped - Use of Assistive Equipment - Consider splint use and AE equipment to protect joints from deformity and stresses Then reviewed activities that can be modified with adaptive equipment and encourage patient to look adaptive equipment for arthritis [i.e. to consider joint protection].   Also trialled low-tech version of wrist position for improved sleep position ad  comfort of L wrist with small soft sock with folded wash cloth along inside of wrist to minimize flexion and decrease sensory changes noted frequently in the morning.   Therapeutic Exercises:  Introduced theraband exercises with red bands for strength and endurance of affected extremity. Practiced exercises in standing at a closed door with band positioned over the top of the door to work on shoulder ROM and elbow extension and trunk stability. Various modifications made by changing positions  ie) turning body 90 degrees x 4 positions for standing shoulder extension at top of the door ie) pull down, PNF like patterns L to R and R to L as well as pull out motions with back facing the door.  In addition, handle made in the band to minimize need for tight grasp with pulling band. Pt able to perform all motions min difficulty.   Reviewed OT goals for DC set today:    PATIENT EDUCATION: Education details: Primary school teacher, HEP Person educated: Patient Education method: Explanation, Demonstration, Tactile cues, Verbal cues, and Handouts Education comprehension: verbalized understanding, returned demonstration, and verbal cues required  HOME EXERCISE PROGRAM: 08/17/23 - Tendon Gliding Exercises & sleep positions 08/24/23 - Putty Activities Access Code: [email or web address] and Joint protection 09/28/23 - Theraband Exercises - same access code & Jt protection handouts provided  GOALS: Goals reviewed with patient? No  LONG TERM GOALS: Target date: 09/16/23  Patient will demonstrate updated L UE HEP with visual handouts only for proper execution.  Baseline: Issued Tendon Gliding exercises at Eval Goal status: MET  2.  Patient will demonstrate at least 30 lbs LUE grip strength as needed to open jars and other containers. Baseline: 22.7 lbs and rates herself as severe difficulty opening a jar per QuickDash Goal status: MET Right: 72, 70, 80 - Average 74 lbs Left: 45, 41, 60 - Average 46.7 lbs QuickDash:  9.1  3.  Pt will be independent with splint wear and care for LUE to minimize tingling and numbness at night and decrease pain. Baseline: Daily numbness and pain at 4/10 at worst Goal status: MET  4.  Pt will be independent with sleep positioning recommendations to minimize tingling and numbness of UEs. Baseline: Sleep position education initiated at eval Goal status: MET 09/28/23 - Significantly improved, not daily, has been working on sleep position to decreased pain and tingling  5.  Pt will independently recall at least 3 total joint protection, ergonomic, and body mechanic principles to minimize risk for pain and discomfort. Baseline: New to outpt OT Goal status: MET Positioning with pillows at night & handouts provided.   ASSESSMENT:  CLINICAL IMPRESSION: Patient is a 43 y.o. female who was seen today for occupational therapy treatment for history of L hand pain. Patient presented with less LUE pain and numbness and is set for DC from OT today.    PERFORMANCE DEFICITS: in functional skills including ADLs, IADLs, coordination, dexterity, sensation, edema, ROM, strength, pain, fascial restrictions, muscle spasms, flexibility, Fine motor control, Gross motor control, body mechanics, decreased knowledge of precautions, decreased knowledge of use of DME, and UE functional use,  and psychosocial skills including coping strategies, environmental adaptation, and habits.   IMPAIRMENTS: are limiting patient from ADLs, rest and sleep, work, leisure, and social participation.   COMORBIDITIES: may have co-morbidities  that affects occupational performance. Patient will benefit from skilled OT to address above impairments and improve overall function.  REHAB POTENTIAL: Good  PLAN: OCCUPATIONAL THERAPY DISCHARGE SUMMARY  Visits from Start of Care: 5  Current functional level related to goals / functional outcomes: Pt has met all goals to satisfactory levels and is pleased with outcomes.    Remaining deficits: Pt has no more significant functional deficits and only occasional discomfort.   Education / Equipment: Pt has all needed materials and education. Pt understands how to continue on with self-management. See tx notes for more details.   Patient agrees to discharge due to max benefits received from outpatient occupational therapy / hand  therapy at this time.     Victorino Sparrow, OT 09/28/2023, 12:55 PM

## 2023-09-28 NOTE — Patient Instructions (Addendum)
 Access Code: [email or web address] URL: [email or web address] Date: 09/28/2023 Prepared by: Amada Kingfisher  New Exercises  - Single Arm Shoulder Extension with Anchored Resistance  - 1 x daily - 10 reps - Standing Single Arm Shoulder PNF D1 Extension with Anchored Resistance  - 1 x daily - 10 reps - Standing Diagonal Shoulder Extension with Anchored Resistance  - 1 x daily - 10 reps - Standing Single Arm Shoulder Flexion with Posterior Anchored Resistance  - 1 x daily - 10 reps

## 2023-10-20 ENCOUNTER — Telehealth: Payer: Self-pay

## 2023-10-20 NOTE — Telephone Encounter (Signed)
 I called patient to schedule surgery w/ Dr. Briscoe Deutscher on 12/19/23. I left a voicemail requesting patient call me back to schedule. [phone].

## 2023-10-24 ENCOUNTER — Ambulatory Visit: Payer: Medicaid Other | Admitting: Dietician

## 2023-10-31 ENCOUNTER — Ambulatory Visit: Payer: Self-pay | Admitting: Licensed Clinical Social Worker

## 2023-10-31 ENCOUNTER — Telehealth: Payer: Self-pay

## 2023-10-31 NOTE — Telephone Encounter (Signed)
 Patient returned my call for surgery. 12/19/23 is no longer available, Dr. Elester Grim is fully booked. Patient requested to be scheduled on 01/03/24. Advised I would reach out to the [place] team to ensure they are available and call her back.

## 2023-11-03 NOTE — Patient Outreach (Signed)
 Complex Care Management   Visit Note  10/31/2023  Name:  Gabriela Best MRN: [id number] DOB: March 13, 1981  Situation: Referral received for Complex Care Management related to  Stress  I obtained verbal consent from Patient.  Visit completed with pt  on the phone  Background:   Past Medical History:  Diagnosis Date   Abnormal menstrual cycle    Acute perforated appendicitis 11/14/2020   Migraine    Ovarian cyst    Symptomatic cholelithiasis 11/13/2020    Assessment: Patient Reported Symptoms:  Cognitive Cognitive Status: Alert and oriented to person, place, and time      Neurological      HEENT HEENT Symptoms Reported: No symptoms reported      Cardiovascular Cardiovascular Symptoms Reported: No symptoms reported    Respiratory Respiratory Symptoms Reported: No symptoms reported    Endocrine Patient reports the following symptoms related to hypoglycemia or hyperglycemia : No symptoms reported    Gastrointestinal Gastrointestinal Symptoms Reported: No symptoms reported      Genitourinary      Integumentary Integumentary Symptoms Reported: No symptoms reported    Musculoskeletal Musculoskelatal Symptoms Reviewed: No symptoms reported        Psychosocial Psychosocial Symptoms Reported: No symptoms reported   Major Change/Loss/Stressor/Fears (CP): Medical condition, self Quality of Family Relationships: involved, supportive Do you feel physically threatened by others?: No      08/23/2023    9:06 AM  Depression screen PHQ 2/9  Decreased Interest 0  Down, Depressed, Hopeless 1  PHQ - 2 Score 1  Altered sleeping 1  Tired, decreased energy 0  Change in appetite 1  Feeling bad or failure about yourself  1  Trouble concentrating 0  Moving slowly or fidgety/restless 0  Suicidal thoughts 0  PHQ-9 Score 4  Difficult doing work/chores Somewhat difficult    There were no vitals filed for this visit.  Medications Reviewed Today   Medications were not reviewed in  this encounter     Recommendation:   Continue utilizing strategies discussed to assist with symptom management  Follow Up Plan:   Telephone follow-up 4-6 weeks  Alease Hunter, LCSW [place]  [facility], [facility] Clinical Social Worker Direct Dial: [phone]  Fax: [phone] Website: [email or web address] 5:10 PM

## 2023-11-03 NOTE — Patient Instructions (Signed)
 Visit Information  Thank you for taking time to visit with me today. Please don't hesitate to contact me if I can be of assistance to you before our next scheduled appointment.  Our next appointment is by telephone on 5/26 at 3:30 PM Please call the care guide team at [phone] if you need to cancel or reschedule your appointment.   Following is a copy of your care plan:   Goals Addressed             This Visit's Progress    LCSW [facility] Social Work Care Plan   On track    Problems:   Stress Management  CSW Clinical Goal(s):   Over the next 90 days the Patient will attend all scheduled medical appointments as evidenced by patient report and care team review of appointment completion in electronic MEDICAL RECORD NUMBER  demonstrate a reduction in symptoms related to Stress at per pt report .  Interventions:  Mental Health:  Evaluation of current treatment plan related to Stress at per pt report Active listening / Reflection utilized Emotional Support Provided Mindfulness or Relaxation training provided  Patient Goals/Self-Care Activities:  Increase coping skills, healthy habits, and self-management skills  Plan:   Telephone follow up appointment with care management team member scheduled for:  4-6 weeks     COMPLETED: Obtain Supportive Resources       Activities and task to complete in order to accomplish goals.   Keep all upcoming appointments discussed today Continue with compliance of taking medication prescribed by Doctor Implement healthy coping skills discussed to assist with management of symptoms  Review supportive information on crisis intervention resources Continue working with IBH LCSW to assist with management of symptoms         Please call the Suicide and Crisis Lifeline: 988 call 911 if you are experiencing a Mental Health or Behavioral Health Crisis or need someone to talk to.  Patient verbalizes understanding of instructions and care plan provided  today and agrees to view in MyChart. Active MyChart status and patient understanding of how to access instructions and care plan via MyChart confirmed with patient.     Arlis Bent [facility]  [facility], [facility] Clinical Social Worker Direct Dial: [phone]  Fax: [phone] Website: [email or web address] 5:11 PM

## 2023-11-07 ENCOUNTER — Encounter (HOSPITAL_COMMUNITY): Payer: Self-pay

## 2023-11-07 ENCOUNTER — Emergency Department (HOSPITAL_COMMUNITY)

## 2023-11-07 ENCOUNTER — Emergency Department (HOSPITAL_COMMUNITY)
Admission: EM | Admit: 2023-11-07 | Discharge: 2023-11-07 | Discharge status: Against Medical Advice | Attending: Emergency Medicine | Admitting: Emergency Medicine

## 2023-11-07 DIAGNOSIS — M542 Cervicalgia: Secondary | ICD-10-CM | POA: Insufficient documentation

## 2023-11-07 DIAGNOSIS — Z5321 Procedure and treatment not carried out due to patient leaving prior to being seen by health care provider: Secondary | ICD-10-CM | POA: Insufficient documentation

## 2023-11-07 DIAGNOSIS — Y92410 Unspecified street and highway as the place of occurrence of the external cause: Secondary | ICD-10-CM | POA: Insufficient documentation

## 2023-11-07 DIAGNOSIS — M25331 Other instability, right wrist: Secondary | ICD-10-CM

## 2023-11-07 LAB — PREGNANCY, URINE: Preg Test, Ur: NEGATIVE

## 2023-11-07 MED ORDER — ACETAMINOPHEN 500 MG PO TABS
1000.0000 mg | ORAL_TABLET | Freq: Once | ORAL | Status: AC
  Administered 2023-11-07: 1000 mg via ORAL
  Filled 2023-11-07: qty 2

## 2023-11-07 MED ORDER — CYCLOBENZAPRINE HCL 10 MG PO TABS: 10.0000 mg | ORAL_TABLET | Freq: Two times a day (BID) | ORAL | 0 refills | Status: DC | PRN

## 2023-11-07 MED ORDER — OXYCODONE HCL 5 MG PO TABS
5.0000 mg | ORAL_TABLET | Freq: Once | ORAL | Status: AC
  Administered 2023-11-07: 5 mg via ORAL
  Filled 2023-11-07: qty 1

## 2023-11-07 MED ORDER — KETOROLAC TROMETHAMINE 15 MG/ML IJ SOLN
15.0000 mg | Freq: Once | INTRAMUSCULAR | Status: AC
  Administered 2023-11-07: 15 mg via INTRAMUSCULAR
  Filled 2023-11-07: qty 1

## 2023-11-07 NOTE — ED Notes (Signed)
 Patient ambulated to the bathroom and was able to use the bathroom without any assistance.
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
## 2023-11-09 ENCOUNTER — Ambulatory Visit: Attending: Physician Assistant | Admitting: Physician Assistant

## 2023-11-09 ENCOUNTER — Encounter: Payer: Self-pay | Admitting: Physician Assistant

## 2023-11-09 ENCOUNTER — Ambulatory Visit: Payer: Self-pay

## 2023-11-09 VITALS — BP 113/78 | HR 98 | Temp 98.4°F | Ht 62.0 in | Wt 273.0 lb

## 2023-11-09 DIAGNOSIS — M79642 Pain in left hand: Secondary | ICD-10-CM | POA: Diagnosis not present

## 2023-11-09 DIAGNOSIS — M79641 Pain in right hand: Secondary | ICD-10-CM | POA: Diagnosis not present

## 2023-11-09 DIAGNOSIS — S39012A Strain of muscle, fascia and tendon of lower back, initial encounter: Secondary | ICD-10-CM

## 2023-11-09 DIAGNOSIS — M62838 Other muscle spasm: Secondary | ICD-10-CM

## 2023-11-09 DIAGNOSIS — Z09 Encounter for follow-up examination after completed treatment for conditions other than malignant neoplasm: Secondary | ICD-10-CM

## 2023-11-09 MED ORDER — CYCLOBENZAPRINE HCL 10 MG PO TABS: 5.0000 mg | ORAL_TABLET | Freq: Three times a day (TID) | ORAL | 0 refills | Status: DC | PRN

## 2023-11-09 MED ORDER — NAPROXEN 500 MG PO TABS: 500.0000 mg | ORAL_TABLET | Freq: Two times a day (BID) | ORAL | 0 refills | Status: DC
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
## 2023-11-09 NOTE — Patient Instructions (Signed)
 Muscle Cramps and Spasms Muscle cramps and spasms occur when a muscle or muscles tighten and you have no control over this tightening (involuntary muscle contraction). They are a common problem that can happen in any muscle. The most common place is in the calf muscles of the leg. There are a few ways that muscle cramps and spasms differ: Muscle cramps are painful. They come and go and may last for a few seconds or up to 15 minutes. Muscle cramps are often more forceful and last longer than muscle spasms. Muscle spasms may or may not be painful. They may last just a few seconds or last much longer. Certain conditions, such as diabetes or Parkinson's disease, can make you more likely to have cramps or spasms. But in most cases, cramps and spasms are not caused by other conditions. Common causes include: Overexertion. This is when you do more physical work or exercise than your body is ready for. Overuse from doing the same movements too many times. Staying in one position for too long. Improper preparation, form, or technique when playing a sport or doing an activity. Not enough water or other fluids in your body (dehydration). Other causes may include: Injury. Side effects of some medicines. Too few salts and minerals in your body (electrolytes), such as potassium and calcium. This could happen if you are taking water pills (diuretics) or if you are pregnant. In many cases, the cause of muscle cramps or spasms is not known. Follow these instructions at home: Eating and drinking Drink enough fluid to keep your pee (urine) pale yellow. This can help prevent cramps or spasms. Eat a healthy diet that includes a lot of nutrients to help your muscles work. A healthy diet includes fruits and vegetables, lean protein, whole grains, and low-fat or nonfat dairy products. Managing pain and stiffness     Try to massage, stretch, and relax the affected muscle. Do this for a few minutes at a time. If told,  put ice on the muscles. This may help if you are sore or have pain after a cramp or spasm. Put ice in a plastic bag. Place a towel between your skin and the bag. Leave the ice on for 20 minutes, 2-3 times a day. If told, apply heat to tight or tense muscles as often as told by your health care provider. Use the heat source that your provider recommends, such as a moist heat pack or a heating pad. Place a towel between your skin and the heat source. Leave the heat on for 20-30 minutes. If your skin turns bright red, remove the ice or heat right away to prevent skin damage. The risk of damage is higher if you cannot feel pain, heat, or cold. Take hot showers or baths to help relax tight muscles. General instructions If you are having cramps often, avoid intense exercise for a few days. Take over-the-counter and prescription medicines only as told by your provider. Watch for any changes in your symptoms. Contact a health care provider if: Your cramps or spasms get more severe or happen more often. Your cramps or spasms do not get better over time. This information is not intended to replace advice given to you by your health care provider. Make sure you discuss any questions you have with your health care provider. Document Revised: 02/23/2022 Document Reviewed: 02/23/2022 Elsevier Patient Education  2024 [employer].

## 2023-11-23 ENCOUNTER — Telehealth: Payer: Self-pay

## 2023-11-23 NOTE — Telephone Encounter (Signed)
 I called patient to see if she was available for surgery w/ Dr. Elester Grim on 01/03/24 @2 :30pm? I left a voicemail requesting a call back at [phone].

## 2023-11-23 NOTE — Telephone Encounter (Signed)
 Patient called me back to confirm she's available for surgery on 01/03/24 w/ Dr. Elester Grim at 2:30 pm. Pre-op instructions and surgery details were provided by phone. Written details will sent to patients Mychart acct.

## 2023-11-25 ENCOUNTER — Other Ambulatory Visit: Payer: Self-pay

## 2023-11-25 ENCOUNTER — Other Ambulatory Visit (INDEPENDENT_AMBULATORY_CARE_PROVIDER_SITE_OTHER): Payer: Self-pay

## 2023-11-25 ENCOUNTER — Encounter: Payer: Self-pay | Admitting: Physician Assistant

## 2023-11-25 ENCOUNTER — Ambulatory Visit (INDEPENDENT_AMBULATORY_CARE_PROVIDER_SITE_OTHER): Admitting: Physician Assistant

## 2023-11-25 DIAGNOSIS — G8929 Other chronic pain: Secondary | ICD-10-CM

## 2023-11-25 DIAGNOSIS — M25512 Pain in left shoulder: Secondary | ICD-10-CM

## 2023-11-25 DIAGNOSIS — M545 Low back pain, unspecified: Secondary | ICD-10-CM | POA: Insufficient documentation

## 2023-11-25 DIAGNOSIS — M79642 Pain in left hand: Secondary | ICD-10-CM

## 2023-11-25 DIAGNOSIS — M5442 Lumbago with sciatica, left side: Secondary | ICD-10-CM

## 2023-11-25 DIAGNOSIS — M5441 Lumbago with sciatica, right side: Secondary | ICD-10-CM

## 2023-11-25 MED ORDER — METHYLPREDNISOLONE ACETATE 40 MG/ML IJ SUSP
40.0000 mg | INTRAMUSCULAR | Status: AC | PRN
  Administered 2023-11-25: 40 mg via INTRA_ARTICULAR

## 2023-11-25 MED ORDER — LIDOCAINE HCL 1 % IJ SOLN
3.0000 mL | INTRAMUSCULAR | Status: AC | PRN
  Administered 2023-11-25: 3 mL
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
## 2023-11-28 ENCOUNTER — Other Ambulatory Visit: Payer: Medicaid Other

## 2023-11-28 DIAGNOSIS — E785 Hyperlipidemia, unspecified: Secondary | ICD-10-CM

## 2023-11-28 DIAGNOSIS — R7303 Prediabetes: Secondary | ICD-10-CM

## 2023-11-28 DIAGNOSIS — E559 Vitamin D deficiency, unspecified: Secondary | ICD-10-CM

## 2023-11-28 DIAGNOSIS — I10 Essential (primary) hypertension: Secondary | ICD-10-CM

## 2023-11-29 ENCOUNTER — Encounter: Payer: Self-pay | Admitting: Physical Therapy

## 2023-11-29 ENCOUNTER — Ambulatory Visit: Attending: Family | Admitting: Physical Therapy

## 2023-11-29 DIAGNOSIS — M5441 Lumbago with sciatica, right side: Secondary | ICD-10-CM | POA: Diagnosis not present

## 2023-11-29 DIAGNOSIS — M6281 Muscle weakness (generalized): Secondary | ICD-10-CM | POA: Diagnosis present

## 2023-11-29 DIAGNOSIS — M79642 Pain in left hand: Secondary | ICD-10-CM | POA: Insufficient documentation

## 2023-11-29 DIAGNOSIS — M25512 Pain in left shoulder: Secondary | ICD-10-CM | POA: Insufficient documentation

## 2023-11-29 DIAGNOSIS — M5442 Lumbago with sciatica, left side: Secondary | ICD-10-CM | POA: Diagnosis not present

## 2023-11-29 DIAGNOSIS — M542 Cervicalgia: Secondary | ICD-10-CM | POA: Diagnosis present

## 2023-11-29 DIAGNOSIS — G8929 Other chronic pain: Secondary | ICD-10-CM | POA: Diagnosis present

## 2023-11-29 DIAGNOSIS — R293 Abnormal posture: Secondary | ICD-10-CM | POA: Diagnosis present
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
## 2023-12-02 ENCOUNTER — Other Ambulatory Visit: Payer: Medicaid Other

## 2023-12-02 ENCOUNTER — Telehealth: Payer: Self-pay | Admitting: Internal Medicine

## 2023-12-02 LAB — CBC WITH DIFFERENTIAL/PLATELET
Absolute Lymphocytes: 2962 {cells}/uL (ref 850–3900)
Absolute Monocytes: 442 {cells}/uL (ref 200–950)
Basophils Absolute: 64 {cells}/uL (ref 0–200)
Basophils Relative: 0.7 %
Eosinophils Absolute: 212 {cells}/uL (ref 15–500)
Eosinophils Relative: 2.3 %
HCT: 36.7 % (ref 35.0–45.0)
Hemoglobin: 11.7 g/dL (ref 11.7–15.5)
MCH: 27.2 pg (ref 27.0–33.0)
MCHC: 31.9 g/dL — ABNORMAL LOW (ref 32.0–36.0)
MCV: 85.3 fL (ref 80.0–100.0)
MPV: 10.1 fL (ref 7.5–12.5)
Monocytes Relative: 4.8 %
Neutro Abs: 5520 {cells}/uL (ref 1500–7800)
Neutrophils Relative %: 60 %
Platelets: 433 10*3/uL — ABNORMAL HIGH (ref 140–400)
RBC: 4.3 10*6/uL (ref 3.80–5.10)
RDW: 14.1 % (ref 11.0–15.0)
Total Lymphocyte: 32.2 %
WBC: 9.2 10*3/uL (ref 3.8–10.8)

## 2023-12-02 LAB — COMPLETE METABOLIC PANEL WITHOUT GFR
AG Ratio: 1.6 (calc) (ref 1.0–2.5)
ALT: 10 U/L (ref 6–29)
AST: 10 U/L (ref 10–30)
Albumin: 4.1 g/dL (ref 3.6–5.1)
Alkaline phosphatase (APISO): 73 U/L (ref 31–125)
BUN: 15 mg/dL (ref 7–25)
CO2: 28 mmol/L (ref 20–32)
Calcium: 8.9 mg/dL (ref 8.6–10.2)
Chloride: 106 mmol/L (ref 98–110)
Creat: 0.81 mg/dL (ref 0.50–0.99)
Globulin: 2.5 g/dL (ref 1.9–3.7)
Glucose, Bld: 99 mg/dL (ref 65–99)
Potassium: 4.8 mmol/L (ref 3.5–5.3)
Sodium: 139 mmol/L (ref 135–146)
Total Bilirubin: 0.2 mg/dL (ref 0.2–1.2)
Total Protein: 6.6 g/dL (ref 6.1–8.1)

## 2023-12-02 LAB — LIPID PANEL
Cholesterol: 191 mg/dL (ref <200)
HDL: 49 mg/dL — ABNORMAL LOW (ref >=50)
LDL Cholesterol (Calc): 126 mg/dL — ABNORMAL HIGH
Non-HDL Cholesterol (Calc): 142 mg/dL — ABNORMAL HIGH (ref <130)
Total CHOL/HDL Ratio: 3.9 (calc) (ref <5.0)
Triglycerides: 66 mg/dL (ref <150)

## 2023-12-02 LAB — VITAMIN D 1,25 DIHYDROXY
Vitamin D 1, 25 (OH)2 Total: 50 pg/mL (ref 18–72)
Vitamin D2 1, 25 (OH)2: 22 pg/mL
Vitamin D3 1, 25 (OH)2: 28 pg/mL

## 2023-12-02 LAB — TSH: TSH: 0.84 m[IU]/L

## 2023-12-02 LAB — HEMOGLOBIN A1C
Hgb A1c MFr Bld: 5.7 % — ABNORMAL HIGH (ref <5.7)
Mean Plasma Glucose: 117 mg/dL
eAG (mmol/L): 6.5 mmol/L

## 2023-12-02 NOTE — Telephone Encounter (Signed)
 Copied from CRM [id number]. Topic: Clinical - Lab/Test Results >> Dec 02, 2023  3:11 PM Lenon Radar A wrote:  Reason for CRM: Patient called in and wanted to know if she needed to get lab work done again today as she just had it done Monday. Patient stated the lab work is for the same labs that she just had done Monday. Please advise If patient needs additional lab work done. Patient can be reached via MyChart or phone at [phone].

## 2023-12-05 ENCOUNTER — Encounter: Payer: Self-pay | Admitting: Family

## 2023-12-05 ENCOUNTER — Ambulatory Visit: Admitting: Family

## 2023-12-05 ENCOUNTER — Ambulatory Visit: Admitting: Physical Therapy

## 2023-12-05 ENCOUNTER — Ambulatory Visit: Payer: Medicaid Other | Admitting: Family

## 2023-12-05 VITALS — BP 146/86 | Temp 97.6°F | Resp 20 | Ht 62.0 in | Wt 275.8 lb

## 2023-12-05 DIAGNOSIS — M6281 Muscle weakness (generalized): Secondary | ICD-10-CM

## 2023-12-05 DIAGNOSIS — E785 Hyperlipidemia, unspecified: Secondary | ICD-10-CM

## 2023-12-05 DIAGNOSIS — F17209 Nicotine dependence, unspecified, with unspecified nicotine-induced disorders: Secondary | ICD-10-CM

## 2023-12-05 DIAGNOSIS — M79642 Pain in left hand: Secondary | ICD-10-CM

## 2023-12-05 DIAGNOSIS — G8929 Other chronic pain: Secondary | ICD-10-CM

## 2023-12-05 DIAGNOSIS — I10 Essential (primary) hypertension: Secondary | ICD-10-CM | POA: Diagnosis not present

## 2023-12-05 DIAGNOSIS — E559 Vitamin D deficiency, unspecified: Secondary | ICD-10-CM

## 2023-12-05 DIAGNOSIS — M542 Cervicalgia: Secondary | ICD-10-CM

## 2023-12-05 DIAGNOSIS — R7303 Prediabetes: Secondary | ICD-10-CM | POA: Diagnosis not present

## 2023-12-05 DIAGNOSIS — R293 Abnormal posture: Secondary | ICD-10-CM

## 2023-12-05 NOTE — Telephone Encounter (Signed)
 FYI: Called & spoke to the patient. Verified name & DOB. Inquired patient for further clarification. Patient stated that she had lab work done on 11/28/2023 and wanted to know if she needed blood work on 12/02/2023 at the [facility] lab. Informed patient to reach out to ordering provider to determine if further blood work is required. Patient expressed verbal understanding.
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
## 2023-12-07 ENCOUNTER — Ambulatory Visit: Admitting: Physical Therapy

## 2023-12-07 DIAGNOSIS — G8929 Other chronic pain: Secondary | ICD-10-CM

## 2023-12-07 DIAGNOSIS — M542 Cervicalgia: Secondary | ICD-10-CM

## 2023-12-07 DIAGNOSIS — M6281 Muscle weakness (generalized): Secondary | ICD-10-CM | POA: Diagnosis not present
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
## 2023-12-12 ENCOUNTER — Telehealth: Admitting: Licensed Clinical Social Worker

## 2023-12-13 ENCOUNTER — Telehealth: Payer: Self-pay | Admitting: Licensed Clinical Social Worker

## 2023-12-14 ENCOUNTER — Ambulatory Visit: Admitting: Physical Therapy

## 2023-12-14 DIAGNOSIS — M6281 Muscle weakness (generalized): Secondary | ICD-10-CM | POA: Diagnosis not present

## 2023-12-14 DIAGNOSIS — G8929 Other chronic pain: Secondary | ICD-10-CM

## 2023-12-14 DIAGNOSIS — R293 Abnormal posture: Secondary | ICD-10-CM

## 2023-12-14 DIAGNOSIS — M542 Cervicalgia: Secondary | ICD-10-CM
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
## 2023-12-16 ENCOUNTER — Telehealth: Payer: Self-pay

## 2023-12-16 ENCOUNTER — Ambulatory Visit: Admitting: Physical Therapy

## 2023-12-16 ENCOUNTER — Telehealth: Payer: Self-pay | Admitting: Obstetrics and Gynecology

## 2023-12-16 DIAGNOSIS — M6281 Muscle weakness (generalized): Secondary | ICD-10-CM

## 2023-12-16 DIAGNOSIS — G8929 Other chronic pain: Secondary | ICD-10-CM

## 2023-12-16 DIAGNOSIS — M542 Cervicalgia: Secondary | ICD-10-CM

## 2023-12-16 DIAGNOSIS — R293 Abnormal posture: Secondary | ICD-10-CM

## 2023-12-16 NOTE — Telephone Encounter (Signed)
 Patient called me upset regarding her 01/03/24 surgery w/ Dr. Elester Grim. Patient states she was unaware that she has [employer] and believed she had Medicaid. Patient states she is calling to terminate [employer] plan, and asked me not to cancel her procedure. Patient agreed to call me back with insurance update today, before 5 pm.
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
## 2023-12-19 ENCOUNTER — Ambulatory Visit: Payer: Medicaid Other | Admitting: Internal Medicine

## 2023-12-19 ENCOUNTER — Ambulatory Visit: Admitting: Physical Therapy

## 2023-12-20 ENCOUNTER — Encounter: Admitting: Family

## 2023-12-20 NOTE — Progress Notes (Signed)
   This encounter was created in error - please disregard. No show

## 2023-12-21 ENCOUNTER — Ambulatory Visit: Attending: Family | Admitting: Physical Therapy

## 2023-12-21 DIAGNOSIS — R293 Abnormal posture: Secondary | ICD-10-CM | POA: Insufficient documentation

## 2023-12-21 DIAGNOSIS — M25512 Pain in left shoulder: Secondary | ICD-10-CM | POA: Insufficient documentation

## 2023-12-21 DIAGNOSIS — M542 Cervicalgia: Secondary | ICD-10-CM | POA: Insufficient documentation

## 2023-12-21 DIAGNOSIS — G8929 Other chronic pain: Secondary | ICD-10-CM | POA: Diagnosis present

## 2023-12-21 DIAGNOSIS — M6281 Muscle weakness (generalized): Secondary | ICD-10-CM | POA: Diagnosis present
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
## 2023-12-22 ENCOUNTER — Ambulatory Visit (INDEPENDENT_AMBULATORY_CARE_PROVIDER_SITE_OTHER): Admitting: Family

## 2023-12-22 ENCOUNTER — Encounter: Payer: Self-pay | Admitting: Family

## 2023-12-22 VITALS — BP 140/90 | HR 67 | Temp 98.2°F | Ht 62.0 in | Wt 274.0 lb

## 2023-12-22 DIAGNOSIS — I10 Essential (primary) hypertension: Secondary | ICD-10-CM | POA: Diagnosis not present

## 2023-12-22 DIAGNOSIS — F17209 Nicotine dependence, unspecified, with unspecified nicotine-induced disorders: Secondary | ICD-10-CM | POA: Diagnosis not present

## 2023-12-22 DIAGNOSIS — N946 Dysmenorrhea, unspecified: Secondary | ICD-10-CM | POA: Diagnosis not present

## 2023-12-22 MED ORDER — ONDANSETRON HCL 4 MG PO TABS: 4.0000 mg | ORAL_TABLET | Freq: Three times a day (TID) | ORAL | 1 refills | Status: DC | PRN

## 2023-12-22 MED ORDER — BUPROPION HCL ER (SR) 150 MG PO TB12: 150.0000 mg | ORAL_TABLET | Freq: Every day | ORAL | 1 refills | Status: DC

## 2023-12-22 MED ORDER — AMLODIPINE BESYLATE 5 MG PO TABS: 5.0000 mg | ORAL_TABLET | Freq: Every day | ORAL | 1 refills | Status: DC

## 2023-12-22 NOTE — Progress Notes (Signed)
 Provider: Christean Courts FNP-C  Sabiha Sura, Elijio Guadeloupe, NP  Patient Care Team: Deneisha Dade, Elijio Guadeloupe, NP as PCP - General (Family Medicine) Adriana Albany, LCSW as Social Worker (Licensed Clinical Social Worker)  Extended Emergency Contact Information Primary Emergency Contact: Hopkins,Shaniqua Home Phone: [phone] Relation: Significant other  Code Status: Full Code  Goals of care: Advanced Directive information    12/05/2023    2:55 PM  Advanced Directives  Does Patient Have a Medical Advance Directive? No  Would patient like information on creating a medical advance directive? No - Patient declined     Chief Complaint  Patient presents with   Hypertension    2 week follow up     Discussed the use of AI scribe software for clinical note transcription with the patient, who gave verbal consent to proceed.  History of Present Illness   Gabriela Best is a 43 year old female with hypertension who presents for a follow-up on her blood pressure management.  She has not been monitoring her blood pressure at home due to her demanding work schedule, which includes overnight shifts. Her last recorded blood pressure was 146/86 mmHg, and today it is 146/90 mmHg.  She is not currently taking any medication for nausea as she has run out of Zofran , which was previously used to manage nausea during her menstrual cycle but was ineffective.  She has a history of smoking and was using Chantix  to quit. She had stopped smoking but resumed due to life stressors. Her work schedule is demanding, with shifts that sometimes extend to seven days a week, leaving her with little time for rest. She has been working overnight and has not been home much, impacting her ability to monitor her blood pressure regularly.  No issues with depression or anxiety. She is allergic to lemon juice, lactose, and lime oil. She drinks water regularly.  Past Medical History:  Diagnosis Date   Abnormal menstrual cycle     Acute perforated appendicitis 11/14/2020   Herpes simplex type 2 infection    Migraine    Ovarian cyst    Symptomatic cholelithiasis 11/13/2020   Past Surgical History:  Procedure Laterality Date   BREAST BIOPSY Left 01/14/2023   MM LT BREAST BX W LOC DEV 1ST LESION IMAGE BX SPEC STEREO GUIDE 01/14/2023 [facility]   CHOLECYSTECTOMY N/A 11/13/2020   Procedure: LAPAROSCOPIC CHOLECYSTECTOMY WITH INTRAOPERATIVE CHOLANGIOGRAM;  Surgeon: Junie Olds, MD;  Location: [facility];  Service: General;  Laterality: N/A;   KNEE SURGERY     KNEE SURGERY Left     Allergies  Allergen Reactions   Lemon Juice [Lemon Oil] Swelling    Throat closes up   Lactose Intolerance (Gi)    Lime Oil Swelling    Throat closes up     Outpatient Encounter Medications as of 12/22/2023  Medication Sig   amLODipine (NORVASC) 5 MG tablet Take 1 tablet (5 mg total) by mouth daily.   buPROPion (WELLBUTRIN SR) 150 MG 12 hr tablet Take 1 tablet (150 mg total) by mouth daily.   cyclobenzaprine  (FLEXERIL ) 10 MG tablet Take 0.5 tablets (5 mg total) by mouth 3 (three) times daily as needed for muscle spasms.   loratadine (CLARITIN) 5 MG chewable tablet Chew 5 mg by mouth as needed for allergies. For allergies   naproxen  (NAPROSYN ) 500 MG tablet Take 1 tablet (500 mg total) by mouth 2 (two) times daily with a meal. X 10 days then prn pain   norethindrone  (AYGESTIN ) 5 MG  tablet Take 1 tablet (5 mg total) by mouth daily.   pseudoephedrine (SUDAFED) 30 MG tablet Take 30 mg by mouth as needed for congestion. For allergies as needed   ondansetron  (ZOFRAN ) 4 MG tablet Take 1 tablet (4 mg total) by mouth every 8 (eight) hours as needed for nausea or vomiting.   [DISCONTINUED] ondansetron  (ZOFRAN ) 4 MG tablet Take 1 tablet (4 mg total) by mouth every 8 (eight) hours as needed for nausea or vomiting. (Patient not taking: Reported on 12/22/2023)   [DISCONTINUED] varenicline  (CHANTIX  CONTINUING MONTH PAK) 1 MG tablet Take 1 tablet  (1 mg total) by mouth 2 (two) times daily. (Patient not taking: Reported on 12/22/2023)   [DISCONTINUED] Varenicline  Tartrate, Starter, (CHANTIX  STARTING MONTH PAK) 0.5 MG X 11 & 1 MG X 42 TBPK 0.5 mg PO daily day 1-3, then BID day 4-7 then 1 tab BID (Patient not taking: Reported on 12/22/2023)   No facility-administered encounter medications on file as of 12/22/2023.    Review of Systems  Constitutional:  Negative for appetite change, chills, fatigue, fever and unexpected weight change.  HENT:  Negative for congestion, ear discharge, ear pain, hearing loss, nosebleeds, postnasal drip, rhinorrhea, sinus pressure, sinus pain, sneezing and sore throat.   Eyes:  Negative for pain, discharge, redness, itching and visual disturbance.  Respiratory:  Negative for cough, chest tightness, shortness of breath and wheezing.   Cardiovascular:  Negative for chest pain, palpitations and leg swelling.  Gastrointestinal:  Negative for abdominal distention, abdominal pain, diarrhea, nausea and vomiting.  Genitourinary:  Negative for difficulty urinating, dysuria, flank pain, frequency and urgency.  Musculoskeletal:  Negative for arthralgias, back pain, gait problem, joint swelling, myalgias, neck pain and neck stiffness.  Skin:  Negative for color change, pallor and rash.  Neurological:  Negative for dizziness, syncope, speech difficulty, weakness, light-headedness, numbness and headaches.  Psychiatric/Behavioral:  Negative for agitation, behavioral problems, confusion, hallucinations and sleep disturbance. The patient is not nervous/anxious.        Increase stress    Immunization History  Administered Date(s) Administered   Tdap 04/27/2022   Pertinent  Health Maintenance Due  Topic Date Due   INFLUENZA VACCINE  02/17/2024      04/28/2023    2:41 PM 08/05/2023    1:53 PM 08/23/2023    9:06 AM 11/09/2023    9:26 AM 12/05/2023    2:54 PM  Fall Risk  Falls in the past year? 0 0 0 0 0  Was there an injury with  Fall?  0 0 0 0  Fall Risk Category Calculator  0 0 0 0  Patient at Risk for Falls Due to   No Fall Risks No Fall Risks No Fall Risks  Fall risk Follow up   Falls evaluation completed Falls evaluation completed Falls evaluation completed   Functional Status Survey:    Vitals:   12/22/23 1439 12/22/23 1510  BP: (!) 146/90 (!) 140/90  Pulse: 67   Temp: 98.2 F (36.8 C)   TempSrc: Temporal   SpO2: 98%   Weight: 274 lb (124.3 kg)   Height: 5\' 2"  (1.575 m)    Body mass index is 50.12 kg/m. Physical Exam VITALS: P- 67, BP- 146/90 MEASUREMENTS: Weight- 274 lbs.Morbid obesity  GENERAL: Alert, cooperative, well developed, no acute distress. HEENT: Normocephalic, normal oropharynx, moist mucous membranes. NECK: Neck supple, no pain on movement. CHEST: Clear to auscultation bilaterally, no wheezes, rhonchi, or crackles. CARDIOVASCULAR: Normal heart rate and rhythm, S1 and S2 normal without  murmurs, heart rate 67 bpm. ABDOMEN: Soft, non-tender, non-distended, without organomegaly, normal bowel sounds. EXTREMITIES: No cyanosis or edema. NEUROLOGICAL: Cranial nerves grossly intact, moves all extremities without gross motor or sensory deficit.   Labs reviewed: Recent Labs    07/26/23 0804 09/04/23 2225 11/28/23 0915  NA 138 138 139  K 4.7 4.0 4.8  CL 106 108 106  CO2 27 25 28   GLUCOSE 94 103* 99  BUN 8 11 15   CREATININE 0.85 0.89 0.81  CALCIUM 8.7 8.4* 8.9   Recent Labs    04/11/23 1123 05/08/23 1116 07/26/23 0804 09/04/23 2225 11/28/23 0915  AST 12 13* 10 14* 10  ALT 8 11 8 13 10   ALKPHOS 66 59  --  57  --   BILITOT <0.2 0.8 0.4 0.4 0.2  PROT 6.3 6.6 6.2 6.2* 6.6  ALBUMIN 3.9 3.5  --  3.3*  --    Recent Labs    01/26/23 1935 04/11/23 1123 07/26/23 0804 09/04/23 2225 11/28/23 0915  WBC 8.8   < > 8.0 9.8 9.2  NEUTROABS 5.2  --  4,512  --  5,520  HGB 10.9*   < > 11.3* 11.2* 11.7  HCT 34.3*   < > 35.6 35.8* 36.7  MCV 86.4   < > 83.6 85.4 85.3  PLT 400   < > 381  361 433*   < > = values in this interval not displayed.   Lab Results  Component Value Date   TSH 0.84 11/28/2023   Lab Results  Component Value Date   HGBA1C 5.7 (H) 11/28/2023   Lab Results  Component Value Date   CHOL 191 11/28/2023   HDL 49 (L) 11/28/2023   LDLCALC 126 (H) 11/28/2023   TRIG 66 11/28/2023   CHOLHDL 3.9 11/28/2023    Significant Diagnostic Results in last 30 days:  XR Shoulder Left Result Date: 11/25/2023 Radiographs of her left shoulder demonstrate no evidence of dislocation glenohumeral joint is fairly well-preserved she does have cystic changes in the [facility] joint with calcifications off the acromion no acute fractures noted   Assessment/Plan Assessment and Plan    Hypertension Blood pressure remains elevated at 146/90 mmHg, higher than the previous reading of 146/86 mmHg. She has not been monitoring blood pressure at home due to a busy work schedule. Risks of uncontrolled hypertension include heart enlargement and potential heart failure. Smoking contributes to vascular changes and hypertension. Amlodipine is chosen as the initial treatment, starting at a low dose with potential to increase based on response. - Prescribe amlodipine 5 mg daily. Increase to 10 mg if blood pressure remains elevated. - Instruct to monitor blood pressure at home regularly and record readings. - Advise to reduce smoking to help manage blood pressure. - Educate on reducing salt intake and choosing healthier snacks like fruits and vegetables. - Discuss potential side effect of leg swelling with amlodipine and advise to report if it occurs.  Nicotine dependence She has resumed smoking after previously quitting with the help of Chantix , which is no longer available due to a recall. Interested in quitting smoking again. Wellbutrin is offered as an alternative, with a gradual increase in dosage. - Prescribe Wellbutrin, starting with one pill daily for one month, then increase to twice  daily. - Discuss alternative smoking cessation aids such as nicotine patches.  Nausea related to menstrual cycle Experiences nausea during her menstrual cycle, likely hormonal in nature. Zofran  was previously used but found ineffective for this type of nausea. Natural remedies  are recommended. - Recommend using natural remedies such as mint or ginger tea for nausea relief.   Family/ staff Communication: Reviewed plan of care with patient verbalized understanding   Labs/tests ordered: None   Next Appointment: Return in about 1 month (around 01/23/2024) for Blood pressure.   Total time: minutes. Greater than 50% of total time spent doing patient education regarding HTN,Nausea,Tobacco abuse ,health maintenance including symptom/medication management.   Estil Heman, NP

## 2023-12-23 ENCOUNTER — Encounter: Payer: Self-pay | Admitting: Physician Assistant

## 2023-12-23 ENCOUNTER — Ambulatory Visit: Admitting: Physical Therapy

## 2023-12-23 ENCOUNTER — Ambulatory Visit: Admitting: Physician Assistant

## 2023-12-23 DIAGNOSIS — G8929 Other chronic pain: Secondary | ICD-10-CM

## 2023-12-23 DIAGNOSIS — M5442 Lumbago with sciatica, left side: Secondary | ICD-10-CM | POA: Diagnosis not present

## 2023-12-23 DIAGNOSIS — M5441 Lumbago with sciatica, right side: Secondary | ICD-10-CM | POA: Diagnosis not present

## 2023-12-23 NOTE — Progress Notes (Signed)
 Office Visit Note   Patient: Gabriela Best           Date of Birth: Jan 09, 1981           MRN: [id number] Visit Date: 12/23/2023              Requested by: Lawrance Presume, MD [address] [place],  [place] [address] PCP: Ngetich, Elijio [place], NP  No chief complaint on file.     HPI: Patient is a pleasant 43 year old woman who follows up after doing physical therapy on her shoulder and her lower back.  She reports she is doing much better.  She is got some home exercise programs that certainly help her.  The left shoulder is currently asymptomatic.  Once in a while she gets some aching in her back but she does know she has a little arthritis she usually treats this with anti-inflammatories  Assessment & Plan: Visit Diagnoses:  1. Chronic bilateral low back pain with sciatica, sciatica laterality unspecified     Plan: Patient should continue to do her home exercise program take anti-inflammatories as needed should follow-up with me if she has any concerns  Follow-Up Instructions: No follow-ups on file.   Ortho Exam  Patient is alert, oriented, no adenopathy, well-dressed, normal affect, normal respiratory effort. She has full range of motion of her shoulder without any pain excellent strength with external rotation internal rotation and abduction.  With regards to her low back she has no pain with forward flexion extension strength is 5 out of 5    Imaging: No results found. No images are attached to the encounter.  Labs: Lab Results  Component Value Date   HGBA1C 5.7 (H) 11/28/2023   HGBA1C 5.8 (H) 07/26/2023   HGBA1C 6.0 (H) 04/11/2023     Lab Results  Component Value Date   ALBUMIN 3.3 (L) 09/04/2023   ALBUMIN 3.5 05/08/2023   ALBUMIN 3.9 04/11/2023    No results found for: "MG" Lab Results  Component Value Date   VD25OH 28.4 (L) 08/23/2023   VD25OH 8.4 (L) 04/11/2023    No results found for: "PREALBUMIN"    Latest Ref Rng & Units 11/28/2023     9:15 AM 09/04/2023   10:25 PM 07/26/2023    8:04 AM  CBC EXTENDED  WBC 3.8 - 10.8 Thousand/uL 9.2  9.8  8.0   RBC 3.80 - 5.10 Million/uL 4.30  4.19  4.26   Hemoglobin 11.7 - 15.5 g/dL 19.1  47.8  29.5   HCT 35.0 - 45.0 % 36.7  35.8  35.6   Platelets 140 - 400 Thousand/uL 433  361  381   NEUT# 1,500 - 7,800 cells/uL 5,520   4,512      There is no height or weight on file to calculate BMI.  Orders:  No orders of the defined types were placed in this encounter.  No orders of the defined types were placed in this encounter.    Procedures: No procedures performed  Clinical Data: No additional findings.  ROS:  All other systems negative, except as noted in the HPI. Review of Systems  Objective: Vital Signs: LMP 12/21/2023 (Exact Date)   Specialty Comments:  No specialty comments available.  PMFS History: Patient Active Problem List   Diagnosis Date Noted   Pain in left shoulder 11/25/2023   Low back pain 11/25/2023   Vitamin D  deficiency 08/23/2023   Tobacco dependence 08/23/2023   Morbid obesity (HCC) 08/23/2023   Abnormal  uterine bleeding 08/23/2023   Pain in left hand 08/10/2023   Prediabetes 04/14/2023   Hypertension 04/11/2023   BMI 45.0-49.9, adult (HCC) 04/11/2023   Past Medical History:  Diagnosis Date   Abnormal menstrual cycle    Acute perforated appendicitis 11/14/2020   Herpes simplex type 2 infection    Migraine    Ovarian cyst    Symptomatic cholelithiasis 11/13/2020    Family History  Problem Relation Age of Onset   Breast cancer Maternal Grandmother     Past Surgical History:  Procedure Laterality Date   BREAST BIOPSY Left 01/14/2023   MM LT BREAST BX W LOC DEV 1ST LESION IMAGE BX SPEC STEREO GUIDE 01/14/2023 [facility]   CHOLECYSTECTOMY N/A 11/13/2020   Procedure: LAPAROSCOPIC CHOLECYSTECTOMY WITH INTRAOPERATIVE CHOLANGIOGRAM;  Surgeon: Junie Olds, MD;  Location: [facility];  Service: General;  Laterality: N/A;   KNEE  SURGERY     KNEE SURGERY Left    Social History   Occupational History   Not on file  Tobacco Use   Smoking status: Every Day    Current packs/day: 0.15    Types: Cigarettes   Smokeless tobacco: Never  Vaping Use   Vaping status: Never Used  Substance and Sexual Activity   Alcohol use: Yes    Comment: occ   Drug use: Never   Sexual activity: Yes    Comment: same partner

## 2023-12-26 ENCOUNTER — Ambulatory Visit: Admitting: Physical Therapy

## 2023-12-26 ENCOUNTER — Encounter (HOSPITAL_COMMUNITY): Payer: Self-pay | Admitting: Obstetrics and Gynecology

## 2023-12-26 NOTE — Progress Notes (Signed)
 Spoke w/ via phone for pre-op interview--- [facility] Lab needs dos---- UPT, BMP and EKG per anesthesia. Surgeon orders requested 12/26/23.        Lab results------ COVID test -----patient states asymptomatic no test needed Arrive at -------0900 NPO after MN NO Solid Food.  Clear liquids from MN until---0800 Pre-Surgery Ensure or G2:  Med rec completed Medications to take morning of surgery ----- Wellbutrin  and Norvasc . Diabetic medication -----  GLP1 agonist last dose: GLP1 instructions:  Patient instructed no nail polish to be worn day of surgery Patient instructed to bring photo id and insurance card day of surgery Patient aware to have Driver (ride ) / caregiver    for 24 hours after surgery - partner [facility] Patient Special Instructions ----- Shower with antibacterial soap. Pre-Op special Instructions -----  Patient verbalized understanding of instructions that were given at this phone interview. Patient denies chest pain, sob, fever, cough at the interview.

## 2023-12-28 ENCOUNTER — Ambulatory Visit: Admitting: Physical Therapy

## 2023-12-29 ENCOUNTER — Other Ambulatory Visit: Payer: Self-pay | Admitting: Licensed Clinical Social Worker

## 2023-12-29 DIAGNOSIS — I10 Essential (primary) hypertension: Secondary | ICD-10-CM
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
## 2024-01-03 ENCOUNTER — Encounter (HOSPITAL_COMMUNITY)
Admission: RE | Disposition: A | Discharge status: Home / Self Care | Payer: Self-pay | Source: Home / Self Care | Attending: Obstetrics and Gynecology

## 2024-01-03 ENCOUNTER — Other Ambulatory Visit: Payer: Self-pay

## 2024-01-03 ENCOUNTER — Other Ambulatory Visit (HOSPITAL_COMMUNITY): Payer: Self-pay

## 2024-01-03 ENCOUNTER — Encounter (HOSPITAL_COMMUNITY): Payer: Self-pay | Admitting: Obstetrics and Gynecology

## 2024-01-03 ENCOUNTER — Ambulatory Visit (HOSPITAL_COMMUNITY)

## 2024-01-03 ENCOUNTER — Ambulatory Visit (HOSPITAL_COMMUNITY)
Admission: RE | Admit: 2024-01-03 | Discharge: 2024-01-03 | Disposition: A | Discharge status: Home / Self Care | Attending: Obstetrics and Gynecology | Admitting: Obstetrics and Gynecology

## 2024-01-03 DIAGNOSIS — I10 Essential (primary) hypertension: Secondary | ICD-10-CM | POA: Diagnosis not present

## 2024-01-03 DIAGNOSIS — N938 Other specified abnormal uterine and vaginal bleeding: Secondary | ICD-10-CM | POA: Diagnosis not present

## 2024-01-03 DIAGNOSIS — N840 Polyp of corpus uteri: Secondary | ICD-10-CM | POA: Diagnosis not present

## 2024-01-03 DIAGNOSIS — M62838 Other muscle spasm: Secondary | ICD-10-CM

## 2024-01-03 DIAGNOSIS — M79642 Pain in left hand: Secondary | ICD-10-CM

## 2024-01-03 DIAGNOSIS — S39012A Strain of muscle, fascia and tendon of lower back, initial encounter: Secondary | ICD-10-CM

## 2024-01-03 DIAGNOSIS — D250 Submucous leiomyoma of uterus: Secondary | ICD-10-CM

## 2024-01-03 DIAGNOSIS — N939 Abnormal uterine and vaginal bleeding, unspecified: Secondary | ICD-10-CM | POA: Insufficient documentation

## 2024-01-03 DIAGNOSIS — D252 Subserosal leiomyoma of uterus: Secondary | ICD-10-CM

## 2024-01-03 DIAGNOSIS — F17210 Nicotine dependence, cigarettes, uncomplicated: Secondary | ICD-10-CM | POA: Insufficient documentation

## 2024-01-03 DIAGNOSIS — M79641 Pain in right hand: Secondary | ICD-10-CM

## 2024-01-03 HISTORY — DX: Essential (primary) hypertension: I10

## 2024-01-03 HISTORY — PX: RADIOFREQUENCY ABLATION, LEIOMYOMA, UTERUS, TRANSCERVICAL APPROACH, WITH US GUIDANCE: SHX7624

## 2024-01-03 LAB — BASIC METABOLIC PANEL WITH GFR
Anion gap: 9 (ref 5–15)
BUN: 12 mg/dL (ref 6–20)
CO2: 20 mmol/L — ABNORMAL LOW (ref 22–32)
Calcium: 9 mg/dL (ref 8.9–10.3)
Chloride: 107 mmol/L (ref 98–111)
Creatinine, Ser: 0.84 mg/dL (ref 0.44–1.00)
GFR, Estimated: >60 mL/min (ref >60)
Glucose, Bld: 104 mg/dL — ABNORMAL HIGH (ref 70–99)
Potassium: 4.2 mmol/L (ref 3.5–5.1)
Sodium: 136 mmol/L (ref 135–145)

## 2024-01-03 LAB — POCT PREGNANCY, URINE: Preg Test, Ur: NEGATIVE

## 2024-01-03 SURGERY — RADIOFREQUENCY ABLATION, LEIOMYOMA, UTERUS, TRANSCERVICAL APPROACH, WITH US GUIDANCE
Anesthesia: General | Site: Vagina

## 2024-01-03 MED ORDER — LIDOCAINE 2% (20 MG/ML) 5 ML SYRINGE
INTRAMUSCULAR | Status: AC
  Filled 2024-01-03: qty 5

## 2024-01-03 MED ORDER — DEXMEDETOMIDINE HCL IN NACL 80 MCG/20ML IV SOLN
INTRAVENOUS | Status: DC | PRN
  Administered 2024-01-03: 4 ug via INTRAVENOUS
  Administered 2024-01-03: 16 ug via INTRAVENOUS

## 2024-01-03 MED ORDER — SUCCINYLCHOLINE CHLORIDE 200 MG/10ML IV SOSY
PREFILLED_SYRINGE | INTRAVENOUS | Status: DC | PRN
  Administered 2024-01-03: 100 mg via INTRAVENOUS

## 2024-01-03 MED ORDER — DEXAMETHASONE SODIUM PHOSPHATE 10 MG/ML IJ SOLN
INTRAMUSCULAR | Status: DC | PRN
  Administered 2024-01-03: 10 mg via INTRAVENOUS

## 2024-01-03 MED ORDER — KETOROLAC TROMETHAMINE 15 MG/ML IJ SOLN
INTRAMUSCULAR | Status: DC | PRN
Start: 2024-01-03 — End: 2024-01-03
  Administered 2024-01-03: 15 mg via INTRAVENOUS

## 2024-01-03 MED ORDER — ACETAMINOPHEN 500 MG PO TABS
ORAL_TABLET | ORAL | Status: AC
  Filled 2024-01-03: qty 2

## 2024-01-03 MED ORDER — BUPIVACAINE HCL (PF) 0.5 % IJ SOLN
INTRAMUSCULAR | Status: DC | PRN
  Administered 2024-01-03: 22 mL

## 2024-01-03 MED ORDER — PROPOFOL 10 MG/ML IV BOLUS
INTRAVENOUS | Status: AC
  Filled 2024-01-03: qty 20

## 2024-01-03 MED ORDER — ACETAMINOPHEN 500 MG PO TABS
1000.0000 mg | ORAL_TABLET | ORAL | Status: AC
  Administered 2024-01-03: 1000 mg via ORAL

## 2024-01-03 MED ORDER — CHLORHEXIDINE GLUCONATE 0.12 % MT SOLN
OROMUCOSAL | Status: AC
  Filled 2024-01-03: qty 15

## 2024-01-03 MED ORDER — MIDAZOLAM HCL 2 MG/2ML IJ SOLN
INTRAMUSCULAR | Status: AC
  Filled 2024-01-03: qty 2

## 2024-01-03 MED ORDER — CHLORHEXIDINE GLUCONATE 0.12 % MT SOLN
15.0000 mL | Freq: Once | OROMUCOSAL | Status: AC
Start: 2024-01-03 — End: 2024-01-03
  Administered 2024-01-03: 15 mL via OROMUCOSAL

## 2024-01-03 MED ORDER — LACTATED RINGERS IV SOLN: INTRAVENOUS | Status: DC

## 2024-01-03 MED ORDER — OXYCODONE HCL 5 MG/5ML PO SOLN: 5.0000 mg | Freq: Once | ORAL | Status: AC | PRN

## 2024-01-03 MED ORDER — PHENYLEPHRINE 80 MCG/ML (10ML) SYRINGE FOR IV PUSH (FOR BLOOD PRESSURE SUPPORT)
PREFILLED_SYRINGE | INTRAVENOUS | Status: DC | PRN
  Administered 2024-01-03: 160 ug via INTRAVENOUS
  Administered 2024-01-03: 80 ug via INTRAVENOUS
  Administered 2024-01-03: 160 ug via INTRAVENOUS

## 2024-01-03 MED ORDER — ROCURONIUM BROMIDE 10 MG/ML (PF) SYRINGE
PREFILLED_SYRINGE | INTRAVENOUS | Status: AC
  Filled 2024-01-03: qty 10

## 2024-01-03 MED ORDER — ONDANSETRON HCL 4 MG/2ML IJ SOLN
INTRAMUSCULAR | Status: AC
  Filled 2024-01-03: qty 2

## 2024-01-03 MED ORDER — ACETAMINOPHEN 500 MG PO TABS: 1000.0000 mg | ORAL_TABLET | Freq: Four times a day (QID) | ORAL | 0 refills | Status: AC | PRN

## 2024-01-03 MED ORDER — OXYCODONE HCL 5 MG PO TABS
5.0000 mg | ORAL_TABLET | ORAL | 0 refills | Status: DC | PRN
  Filled 2024-01-03: qty 5, 1d supply, fill #0

## 2024-01-03 MED ORDER — SUCCINYLCHOLINE CHLORIDE 200 MG/10ML IV SOSY
PREFILLED_SYRINGE | INTRAVENOUS | Status: AC
  Filled 2024-01-03: qty 10

## 2024-01-03 MED ORDER — ORAL CARE MOUTH RINSE: 15.0000 mL | Freq: Once | OROMUCOSAL | Status: AC

## 2024-01-03 MED ORDER — FENTANYL CITRATE (PF) 250 MCG/5ML IJ SOLN
INTRAMUSCULAR | Status: DC | PRN
  Administered 2024-01-03: 100 ug via INTRAVENOUS
  Administered 2024-01-03: 150 ug via INTRAVENOUS

## 2024-01-03 MED ORDER — HYDROMORPHONE HCL 1 MG/ML IJ SOLN: 0.2500 mg | INTRAMUSCULAR | Status: DC | PRN

## 2024-01-03 MED ORDER — ONDANSETRON HCL 4 MG/2ML IJ SOLN
INTRAMUSCULAR | Status: DC | PRN
  Administered 2024-01-03: 4 mg via INTRAVENOUS

## 2024-01-03 MED ORDER — NAPROXEN 500 MG PO TABS
500.0000 mg | ORAL_TABLET | Freq: Two times a day (BID) | ORAL | 0 refills | Status: DC
  Filled 2024-01-03: qty 60, 30d supply, fill #0

## 2024-01-03 MED ORDER — PHENYLEPHRINE HCL-NACL 20-0.9 MG/250ML-% IV SOLN
INTRAVENOUS | Status: DC | PRN
  Administered 2024-01-03: 40 ug/min via INTRAVENOUS

## 2024-01-03 MED ORDER — PHENYLEPHRINE 80 MCG/ML (10ML) SYRINGE FOR IV PUSH (FOR BLOOD PRESSURE SUPPORT)
PREFILLED_SYRINGE | INTRAVENOUS | Status: AC
  Filled 2024-01-03: qty 10

## 2024-01-03 MED ORDER — OXYCODONE HCL 5 MG PO TABS
5.0000 mg | ORAL_TABLET | Freq: Once | ORAL | Status: AC | PRN
  Administered 2024-01-03: 5 mg via ORAL

## 2024-01-03 MED ORDER — DEXAMETHASONE SODIUM PHOSPHATE 10 MG/ML IJ SOLN
INTRAMUSCULAR | Status: AC
  Filled 2024-01-03: qty 1

## 2024-01-03 MED ORDER — OXYCODONE HCL 5 MG PO TABS
ORAL_TABLET | ORAL | Status: AC
  Filled 2024-01-03: qty 1

## 2024-01-03 MED ORDER — EPHEDRINE SULFATE-NACL 50-0.9 MG/10ML-% IV SOSY
PREFILLED_SYRINGE | INTRAVENOUS | Status: DC | PRN
  Administered 2024-01-03: 7.5 mg via INTRAVENOUS

## 2024-01-03 MED ORDER — KETOROLAC TROMETHAMINE 30 MG/ML IJ SOLN
INTRAMUSCULAR | Status: AC
  Filled 2024-01-03: qty 1

## 2024-01-03 MED ORDER — SODIUM CHLORIDE 0.9 % IR SOLN
Status: DC | PRN
  Administered 2024-01-03: 3000 mL

## 2024-01-03 MED ORDER — PROPOFOL 10 MG/ML IV BOLUS
INTRAVENOUS | Status: DC | PRN
  Administered 2024-01-03: 280 mg via INTRAVENOUS
  Administered 2024-01-03: 50 mg via INTRAVENOUS
  Administered 2024-01-03: 200 mg via INTRAVENOUS
  Administered 2024-01-03: 50 mg via INTRAVENOUS

## 2024-01-03 MED ORDER — LIDOCAINE 2% (20 MG/ML) 5 ML SYRINGE
INTRAMUSCULAR | Status: DC | PRN
  Administered 2024-01-03: 100 mg via INTRAVENOUS

## 2024-01-03 MED ORDER — DROPERIDOL 2.5 MG/ML IJ SOLN: 0.6250 mg | Freq: Once | INTRAMUSCULAR | Status: DC | PRN

## 2024-01-03 MED ORDER — ONDANSETRON HCL 4 MG/2ML IJ SOLN: 4.0000 mg | Freq: Once | INTRAMUSCULAR | Status: DC | PRN

## 2024-01-03 MED ORDER — BUPIVACAINE HCL (PF) 0.5 % IJ SOLN
INTRAMUSCULAR | Status: AC
  Filled 2024-01-03: qty 30

## 2024-01-03 MED ORDER — MIDAZOLAM HCL 2 MG/2ML IJ SOLN
INTRAMUSCULAR | Status: DC | PRN
  Administered 2024-01-03: 2 mg via INTRAVENOUS

## 2024-01-03 MED ORDER — FENTANYL CITRATE (PF) 250 MCG/5ML IJ SOLN
INTRAMUSCULAR | Status: AC
  Filled 2024-01-03: qty 5

## 2024-01-03 SURGICAL SUPPLY — 22 items
CATH ROBINSON RED A/P 16FR (CATHETERS) IMPLANT
COVER MAYO STAND STRL (DRAPES) ×1 IMPLANT
DEVICE MYOSURE LITE (MISCELLANEOUS) IMPLANT
DEVICE MYOSURE REACH (MISCELLANEOUS) IMPLANT
ELECT DISPERSIVE SONATA (ELECTRODE) ×2 IMPLANT
ELECTRODE REM PT RTRN 9FT ADLT (ELECTROSURGICAL) ×1 IMPLANT
GAUZE 4X4 16PLY ~~LOC~~+RFID DBL (SPONGE) ×1 IMPLANT
GLOVE BIO SURGEON STRL SZ7 (GLOVE) ×1 IMPLANT
GLOVE BIOGEL PI IND STRL 7.0 (GLOVE) ×1 IMPLANT
GOWN STRL REUS W/ TWL XL LVL3 (GOWN DISPOSABLE) ×1 IMPLANT
HANDPIECE RFA SONATA (MISCELLANEOUS) ×1 IMPLANT
KIT PROCEDURE FLUENT (KITS) ×1 IMPLANT
KIT TURNOVER KIT B (KITS) ×1 IMPLANT
PACK VAGINAL MINOR WOMEN LF (CUSTOM PROCEDURE TRAY) ×1 IMPLANT
PAD OB MATERNITY 11 LF (PERSONAL CARE ITEMS) ×1 IMPLANT
SEAL ROD LENS SCOPE MYOSURE (ABLATOR) ×1 IMPLANT
SYR 50ML LL SCALE MARK (SYRINGE) ×1 IMPLANT
SYSTEM TISS REMOVAL MYOSURE XL (MISCELLANEOUS) IMPLANT
TOWEL GREEN STERILE FF (TOWEL DISPOSABLE) ×1 IMPLANT
TRAY FOLEY W/BAG SLVR 14FR (SET/KITS/TRAYS/PACK) ×1 IMPLANT
UNDERPAD 30X36 HEAVY ABSORB (UNDERPADS AND DIAPERS) ×1 IMPLANT
WATER STERILE IRR 1000ML POUR (IV SOLUTION) ×1 IMPLANT

## 2024-01-03 NOTE — Brief Op Note (Signed)
 01/03/2024  12:59 PM  PATIENT:  Gabriela Best  43 y.o. female  PRE-OPERATIVE DIAGNOSIS:  abnormanl uterine bleeding Fibroids  POST-OPERATIVE DIAGNOSIS:  abnormanl uterine bleeding Fibroids  PROCEDURE:  Procedure(s): RADIOFREQUENCY ABLATION, LEIOMYOMA, UTERUS, TRANSCERVICAL APPROACH, WITH US  GUIDANCE (N/A)  SURGEON:  Surgeons and Role:    Kiki Pelton, MD - Primary  PHYSICIAN ASSISTANT: none  ASSISTANTS: none   ANESTHESIA:   general and paracervical block  EBL:  10 mL   BLOOD ADMINISTERED:none  DRAINS: none   LOCAL MEDICATIONS USED:  MARCAINE      SPECIMEN:  Source of Specimen:  endometrial curettings and polyp  DISPOSITION OF SPECIMEN:  PATHOLOGY  COUNTS:  YES  TOURNIQUET:  * No tourniquets in log *  DICTATION: .Note written in EPIC  PLAN OF CARE: Discharge to home after PACU  PATIENT DISPOSITION:  PACU - hemodynamically stable.   Delay start of Pharmacological VTE agent (>24hrs) due to surgical blood loss or risk of bleeding: not applicable

## 2024-01-03 NOTE — Op Note (Addendum)
 Gabriela Best PROCEDURE DATE: 01/03/2024  PREOPERATIVE DIAGNOSIS: abnormal uterine bleeding  POSTOPERATIVE DIAGNOSIS: abnormal uterine bleeding PROCEDURE:    transcervical radiofrequency ablation of leiomyoma, hysteroscopic polypectomy, dilation and curettage SURGEON: Carter Quarry, MD ASSISTANT:  n/a  INDICATIONS: 43 y.o. G1P0010 with AUB.  Risks of surgery were discussed with the patient including but not limited to: bleeding which may require transfusion; infection which may require antibiotics; injury to surrounding organs; need for additional procedures including laparotomy;  and other postoperative/anesthesia complications. Written informed consent was obtained.    FINDINGS:  Normal external genitalia, normal appearing cervix  Hysteroscopically: polypoid lesions scattered on anterior wall of endometrial cavity, bilateral tubal ostia visualized, on ultrasound ~6cm intramural fibroid in left lateral quadrant and 3cm exophytic fibroid    ANESTHESIA: General, paracervial block INTRAVENOUS FLUIDS:  900 ml of LR ESTIMATED BLOOD LOSS:  10 ml URINE OUTPUT: 50 ml SPECIMENS: endometrial polyp and curetting COMPLICATIONS:  None immediate. FLUID DEFICIT: 160 ml normal saline  PROCEDURE DETAILS:  After administering anesthesia, the patient was identified and a time out was performed and procedure was confirmed.  The patient was placed in dorsal lithotomy position.  A speculum was placed, cervix was grasped with a tenaculum. The cervix was easily dilated  The operative hysteroscope was inserted and the uterine cavity was visualized in its entirety.  Both tubal ostia were noted to be normal. The myosure reach was then used to resect the polypoid areas of the endometrial cavity. Once completed, the hysteroscope was removed.  The Sonata handpiece was then inserted and a complete uterine survey was performed with the ultrasound.  Sonata fibroid ablation was then performed.  Fibroid A Depth     ablation size (cm)    treatment time 2.33       3.5 x 2.6                  3:54   1.92       4.3 x 3.5                  5:42 2.90       3.5 x 2.7                  4:06 3.03       2.7 x 2.0                  2:30   All cycles were carried out under direct ultrasound intrauterine guidance and visualization with the ablation guide noted to be within the serosa at all times.  The fibroid appeared ablated with ultrasound guidance and outgassing noted by U/S appearance.  Sponge, needle, and instrument account was correct x 2.  All instruments were removed from the vagina.  Hemostasis was assured.  The patient was then awakened and taken to the recovery room I stable condition, tolerating the procedure well.   Carter Quarry, MD Minimally Invasive Gynecologic Surgery  Obstetrics and Gynecology, [facility] for [facility], [facility] 01/03/2024

## 2024-01-03 NOTE — Transfer of Care (Signed)
 Immediate Anesthesia Transfer of Care Note  Patient: Gabriela Best  Procedure(s) Performed: RADIOFREQUENCY ABLATION, LEIOMYOMA, UTERUS, TRANSCERVICAL APPROACH, WITH US  GUIDANCE (Vagina )  Patient Location: PACU  Anesthesia Type:General  Level of Consciousness: awake and drowsy  Airway & Oxygen Therapy: Patient Spontanous Breathing and Patient connected to face mask oxygen  Post-op Assessment: Report given to RN and Post -op Vital signs reviewed and stable  Post vital signs: Reviewed and stable  Last Vitals:  Vitals Value Taken Time  BP 154/73 01/03/24 13:04  Temp    Pulse 81 01/03/24 13:08  Resp    SpO2 100 % 01/03/24 13:08  Vitals shown include unfiled device data.  Last Pain:  Vitals:   01/03/24 1049  TempSrc: Oral  PainSc: 0-No pain      Patients Stated Pain Goal: 5 (01/03/24 1049)  Complications: No notable events documented.

## 2024-01-03 NOTE — Anesthesia Procedure Notes (Signed)
 Procedure Name: LMA Insertion Date/Time: 01/03/2024 11:22 AM  Performed by: Linard Reno, CRNAPre-anesthesia Checklist: Patient identified, Emergency Drugs available, Suction available and Patient being monitored Patient Re-evaluated:Patient Re-evaluated prior to induction Oxygen Delivery Method: Circle System Utilized Preoxygenation: Pre-oxygenation with 100% oxygen Induction Type: IV induction Ventilation: Mask ventilation without difficulty LMA: LMA with gastric port inserted LMA Size: 5.0 Number of attempts: 1 Airway Equipment and Method: Bite block Placement Confirmation: positive ETCO2 Tube secured with: Tape Dental Injury: Teeth and Oropharynx as per pre-operative assessment  Comments: LMA insertion by Darlina Einstein, SRNA

## 2024-01-03 NOTE — H&P (Signed)
 OB/GYN Pre-Op History and Physical  Gabriela Best is a 43 y.o. G1P0010 presenting for sonata procedure.       Past Medical History:  Diagnosis Date   Abnormal menstrual cycle    Acute perforated appendicitis 11/14/2020   Herpes simplex type 2 infection    Hypertension    Migraine    Ovarian cyst    Symptomatic cholelithiasis 11/13/2020    Past Surgical History:  Procedure Laterality Date   BREAST BIOPSY Left 01/14/2023   MM LT BREAST BX W LOC DEV 1ST LESION IMAGE BX SPEC STEREO GUIDE 01/14/2023 [facility]   CHOLECYSTECTOMY N/A 11/13/2020   Procedure: LAPAROSCOPIC CHOLECYSTECTOMY WITH INTRAOPERATIVE CHOLANGIOGRAM;  Surgeon: Junie Olds, MD;  Location: [facility];  Service: General;  Laterality: N/A;   KNEE SURGERY     KNEE SURGERY Left     OB History  Gravida Para Term Preterm AB Living  1    1   SAB IAB Ectopic Multiple Live Births   1       # Outcome Date GA Lbr Len/2nd Weight Sex Type Anes PTL Lv  1 IAB             Obstetric Comments  Age 52 y/o EAB, d&c    Social History   Socioeconomic History   Marital status: Single    Spouse name: Not on file   Number of children: Not on file   Years of education: Not on file   Highest education level: Not on file  Occupational History   Not on file  Tobacco Use   Smoking status: Every Day    Current packs/day: 0.15    Types: Cigarettes   Smokeless tobacco: Never  Vaping Use   Vaping status: Never Used  Substance and Sexual Activity   Alcohol use: Yes    Comment: occ   Drug use: Never   Sexual activity: Yes    Comment: same partner  Other Topics Concern   Not on file  Social History Narrative   Not on file   Social Drivers of Health   Financial Resource Strain: Medium Risk (08/23/2023)   Overall Financial Resource Strain (CARDIA)    Difficulty of Paying Living Expenses: Somewhat hard  Food Insecurity: No Food Insecurity (12/30/2023)   Hunger Vital Sign    Worried About Running Out of Food in  the Last Year: Never true    Ran Out of Food in the Last Year: Never true  Transportation Needs: No Transportation Needs (08/23/2023)   PRAPARE - Administrator, Civil Service (Medical): No    Lack of Transportation (Non-Medical): No  Physical Activity: Inactive (08/23/2023)   Exercise Vital Sign    Days of Exercise per Week: 0 days    Minutes of Exercise per Session: 0 min  Stress: Stress Concern Present (08/23/2023)   [employer] of Occupational Health - Occupational Stress Questionnaire    Feeling of Stress : To some extent  Social Connections: Moderately Isolated (08/23/2023)   Social Connection and Isolation Panel    Frequency of Communication with Friends and Family: More than three times a week    Frequency of Social Gatherings with Friends and Family: Once a week    Attends Religious Services: Never    Database administrator or Organizations: No    Attends Banker Meetings: Never    Marital Status: Living with partner    Family History  Problem Relation Age of Onset   Breast cancer Maternal  Grandmother     Medications Prior to Admission  Medication Sig Dispense Refill Last Dose/Taking   amLODipine  (NORVASC ) 5 MG tablet Take 1 tablet (5 mg total) by mouth daily. 90 tablet 1 12/26/2023   buPROPion  (WELLBUTRIN  SR) 150 MG 12 hr tablet Take 1 tablet (150 mg total) by mouth daily. 90 tablet 1 12/26/2023   norethindrone  (AYGESTIN ) 5 MG tablet Take 1 tablet (5 mg total) by mouth daily. 60 tablet 6 Past Week   cyclobenzaprine  (FLEXERIL ) 10 MG tablet Take 0.5 tablets (5 mg total) by mouth 3 (three) times daily as needed for muscle spasms. 60 tablet 0 Unknown   loratadine (CLARITIN) 5 MG chewable tablet Chew 5 mg by mouth as needed for allergies. For allergies   Unknown   naproxen  (NAPROSYN ) 500 MG tablet Take 1 tablet (500 mg total) by mouth 2 (two) times daily with a meal. X 10 days then prn pain (Patient not taking: Reported on 12/29/2023) 60 tablet 0 Not Taking    ondansetron  (ZOFRAN ) 4 MG tablet Take 1 tablet (4 mg total) by mouth every 8 (eight) hours as needed for nausea or vomiting. 20 tablet 1 Unknown   pseudoephedrine (SUDAFED) 30 MG tablet Take 30 mg by mouth as needed for congestion. For allergies as needed   Unknown    Allergies  Allergen Reactions   Lemon Juice [Lemon Oil] Swelling    Throat closes up   Lactose Intolerance (Gi)    Lime Oil Swelling    Throat closes up     Review of Systems: Negative except for what is mentioned in HPI.     Physical Exam: LMP 12/20/2023 (Exact Date)  CONSTITUTIONAL: Well-developed, well-nourished and in no acute distress.  HENT:  Normocephalic, atraumatic, External right and left ear normal. Oropharynx is clear and moist EYES: Conjunctivae and EOM are normal. Pupils are equal, round, and reactive to light. No scleral icterus.  NECK: Normal range of motion, supple, no masses SKIN: Skin is warm and dry. No rash noted. Not diaphoretic. No erythema. No pallor. NEUROLGIC: Alert and oriented to person, place, and time. Normal reflexes, muscle tone coordination. No cranial nerve deficit noted. PSYCHIATRIC: Normal mood and affect. Normal behavior. Normal judgment and thought content. RESPIRATORY: Normal effort PELVIC: Deferred   Pertinent Labs/Studies:   No results found for this or any previous visit (from the past 72 hours).     Assessment and Plan :Gabriela Best is a 43 y.o. G1P0010 here for surgical management of fibroid.   Patient desires surgical management with fibroid and AUB.  The risks of surgery were discussed in detail with the patient including but not limited to: bleeding which may require transfusion or reoperation; infection which may require prolonged hospitalization or re-hospitalization and antibiotic therapy; injury to bowel, bladder, ureters and major vessels or other surrounding organs which may lead to other procedures; formation of adhesions; need for additional procedures  including laparotomy or subsequent procedures secondary to intraoperative injury or abnormal pathology; thromboembolic phenomenon; incisional problems and other postoperative or anesthesia complications.  Patient was told that the likelihood that her condition and symptoms will be treated effectively with this surgical management was moderate to hight; the postoperative expectations were also discussed in detail. The patient also understands the alternative treatment options which were discussed in full. All questions were answered.     Aarron Wierzbicki, M.D. Minimally Invasive Gynecologic Surgery and Pelvic Pain Specialist Attending Obstetrician & Gynecologist, [facility] for [employer], [facility]

## 2024-01-03 NOTE — Discharge Instructions (Addendum)
 Post-surgical Instructions, Outpatient Surgery  You may expect to feel dizzy, weak, and drowsy for as long as 24 hours after receiving the medicine that made you sleep (anesthetic). For the first 24 hours after your surgery:   Do not drive a car, ride a bicycle, participate in physical activities, or take public transportation until you are done taking narcotic pain medicines or as directed by Dr. Elester Grim.  Do not drink alcohol or take tranquilizers.  Do not take medicine that has not been prescribed by your physicians.  Do not sign important papers or make important decisions while on narcotic pain medicines.  Have a responsible person with you.    PAIN MANAGEMENT Ibuprofen  800mg .  (This is the same as 4-200mg  over the counter tablets of Motrin  or ibuprofen .)  Take this every 6 hours or as needed for cramping.   Acetaminophen  1000mg  (This is the same as 2-500mg  over the counter extra strength tylenol ). Take this every 6 hours for the first 3 days or as needed afterwards for pain Oxycodone  5mg  For more severe pain, take one or two tablets every four to six hours as needed for pain control.  (Remember that narcotic pain medications increase your risk of constipation.  If this becomes a problem, you may take an over the counter laxative like miralax .)  DO'S AND DON'T'S Do not take a tub bath for 4 weeks.  You may shower on the first day after your surgery Do not do any heavy lifting for one to two weeks.  This increases the chance of bleeding. Do move around as you feel able.  Stairs are fine.  You may begin to exercise again as you feel able.  Do not lift any weights for two weeks. Do not put anything in the vagina for two weeks--no tampons, intercourse, or douching.    REGULAR MEDIATIONS/VITAMINS: You may restart all of your regular medications as prescribed. You may restart all of your vitamins as you normally take them.    PLEASE CALL OR SEEK MEDICAL CARE IF: You have persistent nausea  and vomiting.  You have trouble eating or drinking.  You have an oral temperature above 100.5.  You have constipation that is not helped by adjusting diet or increasing fluid intake. Pain medicines are a common cause of constipation.  You have heavy vaginal bleeding You have redness or drainage from your incision(s) or there is increasing pain or tenderness near or in the surgical site.    Post Anesthesia Home Care Instructions  Activity: Get plenty of rest for the remainder of the day. A responsible individual must stay with you for 24 hours following the procedure.  For the next 24 hours, DO NOT: -Drive a car -Advertising copywriter -Drink alcoholic beverages -Take any medication unless instructed by your physician -Make any legal decisions or sign important papers.  Meals: Start with liquid foods such as gelatin or soup. Progress to regular foods as tolerated. Avoid greasy, spicy, heavy foods. If nausea and/or vomiting occur, drink only clear liquids until the nausea and/or vomiting subsides. Call your physician if vomiting continues.  Special Instructions/Symptoms: Your throat may feel dry or sore from the anesthesia or the breathing tube placed in your throat during surgery. If this causes discomfort, gargle with warm salt water. The discomfort should disappear within 24 hours.

## 2024-01-03 NOTE — Anesthesia Preprocedure Evaluation (Signed)
 Anesthesia Evaluation  Patient identified by MRN, date of birth, ID band Patient awake    Reviewed: Allergy & Precautions, NPO status , Patient's Chart, lab work & pertinent test results  Airway Mallampati: III  TM Distance: >3 FB     Dental  (+) Teeth Intact, Dental Advisory Given   Pulmonary Current Smoker and Patient abstained from smoking.   Pulmonary exam normal breath sounds clear to auscultation + decreased breath sounds      Cardiovascular hypertension, Pt. on medications Normal cardiovascular exam Rhythm:Regular Rate:Normal     Neuro/Psych  Headaches PSYCHIATRIC DISORDERS         GI/Hepatic negative GI ROS, Neg liver ROS,,,  Endo/Other    Class 3 obesity  Renal/GU negative Renal ROS  negative genitourinary   Musculoskeletal negative musculoskeletal ROS (+)    Abdominal  (+) + obese  Peds  Hematology negative hematology ROS (+)   Anesthesia Other Findings   Reproductive/Obstetrics Abnormanl uterine bleeding  Uterine Fibroids  HSV                              Anesthesia Physical Anesthesia Plan  ASA: 3  Anesthesia Plan: General   Post-op Pain Management: Dilaudid IV, Precedex and Tylenol  PO (pre-op)*   Induction: Intravenous  PONV Risk Score and Plan: 4 or greater and Treatment may vary due to age or medical condition, Midazolam , Ondansetron  and Dexamethasone   Airway Management Planned: LMA  Additional Equipment: None  Intra-op Plan:   Post-operative Plan: Extubation in OR  Informed Consent: I have reviewed the patients History and Physical, chart, labs and discussed the procedure including the risks, benefits and alternatives for the proposed anesthesia with the patient or authorized representative who has indicated his/her understanding and acceptance.     Dental advisory given  Plan Discussed with: CRNA and Anesthesiologist  Anesthesia Plan Comments:           Anesthesia Quick Evaluation

## 2024-01-03 NOTE — Anesthesia Postprocedure Evaluation (Signed)
 Anesthesia Post Note  Patient: Gabriela Best  Procedure(s) Performed: RADIOFREQUENCY ABLATION, LEIOMYOMA, UTERUS, TRANSCERVICAL APPROACH, WITH US  GUIDANCE (Vagina )     Patient location during evaluation: PACU Anesthesia Type: General Level of consciousness: awake and alert and oriented Pain management: pain level controlled Vital Signs Assessment: post-procedure vital signs reviewed and stable Respiratory status: spontaneous breathing, nonlabored ventilation and respiratory function stable Cardiovascular status: blood pressure returned to baseline and stable Postop Assessment: no apparent nausea or vomiting Anesthetic complications: no  No notable events documented.  Last Vitals:  Vitals:   01/03/24 1330 01/03/24 1345  BP: (!) 140/59 (!) 147/76  Pulse: 73 72  Resp: 20 18  Temp:  37.1 C  SpO2: 100% 92%    Last Pain:  Vitals:   01/03/24 1407  TempSrc:   PainSc: 4                  Toben Acuna A.

## 2024-01-04 ENCOUNTER — Encounter: Payer: Self-pay | Admitting: Obstetrics and Gynecology

## 2024-01-04 ENCOUNTER — Ambulatory Visit: Payer: Self-pay | Admitting: Obstetrics and Gynecology

## 2024-01-04 ENCOUNTER — Encounter (HOSPITAL_COMMUNITY): Payer: Self-pay | Admitting: Obstetrics and Gynecology

## 2024-01-04 LAB — SURGICAL PATHOLOGY

## 2024-01-05 ENCOUNTER — Encounter: Payer: Self-pay | Admitting: Lactation Services

## 2024-01-06 ENCOUNTER — Ambulatory Visit
Admission: RE | Admit: 2024-01-06 | Discharge: 2024-01-06 | Disposition: A | Discharge status: Home / Self Care | Source: Ambulatory Visit | Attending: Family | Admitting: Family

## 2024-01-06 ENCOUNTER — Ambulatory Visit: Payer: Medicaid Other

## 2024-01-06 DIAGNOSIS — Z1231 Encounter for screening mammogram for malignant neoplasm of breast: Secondary | ICD-10-CM

## 2024-01-07 ENCOUNTER — Emergency Department (HOSPITAL_COMMUNITY)
Admission: EM | Admit: 2024-01-07 | Discharge: 2024-01-08 | Discharge status: Against Medical Advice | Attending: Emergency Medicine | Admitting: Emergency Medicine

## 2024-01-07 DIAGNOSIS — Z79899 Other long term (current) drug therapy: Secondary | ICD-10-CM | POA: Insufficient documentation

## 2024-01-07 DIAGNOSIS — R1031 Right lower quadrant pain: Secondary | ICD-10-CM | POA: Diagnosis present

## 2024-01-07 DIAGNOSIS — I10 Essential (primary) hypertension: Secondary | ICD-10-CM | POA: Insufficient documentation

## 2024-01-07 DIAGNOSIS — R1032 Left lower quadrant pain: Secondary | ICD-10-CM | POA: Insufficient documentation

## 2024-01-07 DIAGNOSIS — R1030 Lower abdominal pain, unspecified: Secondary | ICD-10-CM

## 2024-01-07 DIAGNOSIS — D72829 Elevated white blood cell count, unspecified: Secondary | ICD-10-CM | POA: Insufficient documentation

## 2024-01-08 ENCOUNTER — Other Ambulatory Visit: Payer: Self-pay

## 2024-01-08 ENCOUNTER — Emergency Department (HOSPITAL_COMMUNITY)

## 2024-01-08 LAB — COMPREHENSIVE METABOLIC PANEL WITH GFR
ALT: 14 U/L (ref 0–44)
AST: 12 U/L — ABNORMAL LOW (ref 15–41)
Albumin: 3.4 g/dL — ABNORMAL LOW (ref 3.5–5.0)
Alkaline Phosphatase: 51 U/L (ref 38–126)
Anion gap: 10 (ref 5–15)
BUN: 16 mg/dL (ref 6–20)
CO2: 19 mmol/L — ABNORMAL LOW (ref 22–32)
Calcium: 9.1 mg/dL (ref 8.9–10.3)
Chloride: 108 mmol/L (ref 98–111)
Creatinine, Ser: 0.97 mg/dL (ref 0.44–1.00)
GFR, Estimated: >60 mL/min (ref >60)
Glucose, Bld: 127 mg/dL — ABNORMAL HIGH (ref 70–99)
Potassium: 3.8 mmol/L (ref 3.5–5.1)
Sodium: 137 mmol/L (ref 135–145)
Total Bilirubin: 0.3 mg/dL (ref 0.0–1.2)
Total Protein: 6.6 g/dL (ref 6.5–8.1)

## 2024-01-08 LAB — URINALYSIS, ROUTINE W REFLEX MICROSCOPIC
Bilirubin Urine: NEGATIVE
Glucose, UA: NEGATIVE mg/dL
Ketones, ur: NEGATIVE mg/dL
Nitrite: NEGATIVE
Protein, ur: 30 mg/dL — AB
Specific Gravity, Urine: 1.023 (ref 1.005–1.030)
pH: 5 (ref 5.0–8.0)

## 2024-01-08 LAB — CBC
HCT: 34.6 % — ABNORMAL LOW (ref 36.0–46.0)
Hemoglobin: 11.4 g/dL — ABNORMAL LOW (ref 12.0–15.0)
MCH: 28.1 pg (ref 26.0–34.0)
MCHC: 32.9 g/dL (ref 30.0–36.0)
MCV: 85.4 fL (ref 80.0–100.0)
Platelets: 432 10*3/uL — ABNORMAL HIGH (ref 150–400)
RBC: 4.05 MIL/uL (ref 3.87–5.11)
RDW: 15 % (ref 11.5–15.5)
WBC: 12.9 10*3/uL — ABNORMAL HIGH (ref 4.0–10.5)
nRBC: 0 % (ref 0.0–0.2)

## 2024-01-08 LAB — LIPASE, BLOOD: Lipase: 35 U/L (ref 11–51)

## 2024-01-08 LAB — HCG, SERUM, QUALITATIVE: Preg, Serum: NEGATIVE

## 2024-01-08 MED ORDER — IOHEXOL 350 MG/ML SOLN: 100.0000 mL | Freq: Once | INTRAVENOUS | Status: DC | PRN

## 2024-01-08 MED ORDER — AMLODIPINE BESYLATE 5 MG PO TABS
5.0000 mg | ORAL_TABLET | Freq: Every day | ORAL | Status: DC
  Administered 2024-01-08: 5 mg via ORAL
  Filled 2024-01-08: qty 1

## 2024-01-08 MED ORDER — OXYCODONE HCL 5 MG PO TABS
5.0000 mg | ORAL_TABLET | ORAL | Status: DC | PRN
  Administered 2024-01-08: 5 mg via ORAL
  Filled 2024-01-08: qty 1
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
## 2024-01-08 NOTE — ED Notes (Signed)
 Pt demanding IV be taken out, states she just wants to leave. Encouraged pt to stay for scan or to at least talk to MD. Pt refused. IV removed. Pt ambulatory out of the ED.

## 2024-01-08 NOTE — ED Notes (Signed)
 Called pt x2 for room, no response. Checked entire lobby for pt, pt could not be located.

## 2024-01-10 ENCOUNTER — Ambulatory Visit: Admitting: Internal Medicine

## 2024-01-11 ENCOUNTER — Other Ambulatory Visit: Payer: Self-pay | Admitting: Family

## 2024-01-11 ENCOUNTER — Encounter (HOSPITAL_BASED_OUTPATIENT_CLINIC_OR_DEPARTMENT_OTHER): Payer: Self-pay | Admitting: Emergency Medicine

## 2024-01-11 ENCOUNTER — Other Ambulatory Visit: Payer: Self-pay

## 2024-01-11 ENCOUNTER — Emergency Department (HOSPITAL_BASED_OUTPATIENT_CLINIC_OR_DEPARTMENT_OTHER)

## 2024-01-11 ENCOUNTER — Emergency Department (HOSPITAL_BASED_OUTPATIENT_CLINIC_OR_DEPARTMENT_OTHER)
Admission: EM | Admit: 2024-01-11 | Discharge: 2024-01-12 | Disposition: A | Discharge status: Home / Self Care | Attending: Emergency Medicine | Admitting: Emergency Medicine

## 2024-01-11 ENCOUNTER — Ambulatory Visit
Admission: EM | Admit: 2024-01-11 | Discharge: 2024-01-11 | Disposition: A | Discharge status: Home / Self Care | Attending: Family Medicine | Admitting: Family Medicine

## 2024-01-11 DIAGNOSIS — R1032 Left lower quadrant pain: Secondary | ICD-10-CM | POA: Insufficient documentation

## 2024-01-11 DIAGNOSIS — G8918 Other acute postprocedural pain: Secondary | ICD-10-CM

## 2024-01-11 DIAGNOSIS — R102 Pelvic and perineal pain: Secondary | ICD-10-CM | POA: Insufficient documentation

## 2024-01-11 DIAGNOSIS — D72829 Elevated white blood cell count, unspecified: Secondary | ICD-10-CM | POA: Insufficient documentation

## 2024-01-11 DIAGNOSIS — R1031 Right lower quadrant pain: Secondary | ICD-10-CM | POA: Diagnosis not present

## 2024-01-11 DIAGNOSIS — N939 Abnormal uterine and vaginal bleeding, unspecified: Secondary | ICD-10-CM | POA: Diagnosis present

## 2024-01-11 DIAGNOSIS — Z79899 Other long term (current) drug therapy: Secondary | ICD-10-CM | POA: Diagnosis not present

## 2024-01-11 DIAGNOSIS — R928 Other abnormal and inconclusive findings on diagnostic imaging of breast: Secondary | ICD-10-CM

## 2024-01-11 LAB — CBC WITH DIFFERENTIAL/PLATELET
Abs Immature Granulocytes: 0.03 10*3/uL (ref 0.00–0.07)
Basophils Absolute: 0.1 10*3/uL (ref 0.0–0.1)
Basophils Relative: 1 %
Eosinophils Absolute: 0.6 10*3/uL — ABNORMAL HIGH (ref 0.0–0.5)
Eosinophils Relative: 6 %
HCT: 35.1 % — ABNORMAL LOW (ref 36.0–46.0)
Hemoglobin: 11.5 g/dL — ABNORMAL LOW (ref 12.0–15.0)
Immature Granulocytes: 0 %
Lymphocytes Relative: 30 %
Lymphs Abs: 3 10*3/uL (ref 0.7–4.0)
MCH: 27.7 pg (ref 26.0–34.0)
MCHC: 32.8 g/dL (ref 30.0–36.0)
MCV: 84.6 fL (ref 80.0–100.0)
Monocytes Absolute: 0.5 10*3/uL (ref 0.1–1.0)
Monocytes Relative: 5 %
Neutro Abs: 5.8 10*3/uL (ref 1.7–7.7)
Neutrophils Relative %: 58 %
Platelets: 459 10*3/uL — ABNORMAL HIGH (ref 150–400)
RBC: 4.15 MIL/uL (ref 3.87–5.11)
RDW: 15.5 % (ref 11.5–15.5)
WBC: 10 10*3/uL (ref 4.0–10.5)
nRBC: 0 % (ref 0.0–0.2)

## 2024-01-11 LAB — POCT URINALYSIS DIP (MANUAL ENTRY)
Bilirubin, UA: NEGATIVE
Glucose, UA: NEGATIVE mg/dL
Ketones, POC UA: NEGATIVE mg/dL
Nitrite, UA: NEGATIVE
Spec Grav, UA: 1.025 (ref 1.010–1.025)
Urobilinogen, UA: 0.2 U/dL
pH, UA: 5 (ref 5.0–8.0)

## 2024-01-11 LAB — URINALYSIS, MICROSCOPIC (REFLEX)

## 2024-01-11 LAB — COMPREHENSIVE METABOLIC PANEL WITH GFR
ALT: 10 U/L (ref 0–44)
AST: 12 U/L — ABNORMAL LOW (ref 15–41)
Albumin: 3.9 g/dL (ref 3.5–5.0)
Alkaline Phosphatase: 63 U/L (ref 38–126)
Anion gap: 11 (ref 5–15)
BUN: 10 mg/dL (ref 6–20)
CO2: 23 mmol/L (ref 22–32)
Calcium: 8.7 mg/dL — ABNORMAL LOW (ref 8.9–10.3)
Chloride: 106 mmol/L (ref 98–111)
Creatinine, Ser: 0.94 mg/dL (ref 0.44–1.00)
GFR, Estimated: >60 mL/min (ref >60)
Glucose, Bld: 115 mg/dL — ABNORMAL HIGH (ref 70–99)
Potassium: 4.3 mmol/L (ref 3.5–5.1)
Sodium: 140 mmol/L (ref 135–145)
Total Bilirubin: <0.2 mg/dL (ref 0.0–1.2)
Total Protein: 6.7 g/dL (ref 6.5–8.1)

## 2024-01-11 LAB — URINALYSIS, ROUTINE W REFLEX MICROSCOPIC
Bilirubin Urine: NEGATIVE
Glucose, UA: NEGATIVE mg/dL
Ketones, ur: NEGATIVE mg/dL
Leukocytes,Ua: NEGATIVE
Nitrite: NEGATIVE
Protein, ur: 30 mg/dL — AB
Specific Gravity, Urine: >=1.03 (ref 1.005–1.030)
pH: 5.5 (ref 5.0–8.0)

## 2024-01-11 LAB — PREGNANCY, URINE: Preg Test, Ur: NEGATIVE

## 2024-01-11 LAB — LIPASE, BLOOD: Lipase: 31 U/L (ref 11–51)

## 2024-01-11 MED ORDER — FENTANYL CITRATE PF 50 MCG/ML IJ SOSY
50.0000 ug | PREFILLED_SYRINGE | Freq: Once | INTRAMUSCULAR | Status: AC
  Administered 2024-01-11: 50 ug via INTRAVENOUS
  Filled 2024-01-11: qty 1

## 2024-01-11 MED ORDER — KETOROLAC TROMETHAMINE 30 MG/ML IJ SOLN
30.0000 mg | Freq: Once | INTRAMUSCULAR | Status: AC
  Administered 2024-01-11: 30 mg via INTRAMUSCULAR

## 2024-01-11 MED ORDER — FENTANYL CITRATE PF 50 MCG/ML IJ SOSY
50.0000 ug | PREFILLED_SYRINGE | INTRAMUSCULAR | Status: DC | PRN
  Administered 2024-01-11: 50 ug via INTRAVENOUS
  Filled 2024-01-11: qty 1

## 2024-01-11 MED ORDER — IOHEXOL 300 MG/ML  SOLN
125.0000 mL | Freq: Once | INTRAMUSCULAR | Status: AC | PRN
  Administered 2024-01-11: 125 mL via INTRAVENOUS

## 2024-01-11 NOTE — ED Notes (Signed)
 This tech asked pt about providing urine sample pt stated they could not provide sample at this time, pt was also notified they could not have anything to drink. Nurse was notified
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
## 2024-01-11 NOTE — Discharge Instructions (Signed)
 Please head straight to the emergency room. I am concerned that you are having surgical complications. This requires a higher level of testing and care than we can provide in the urgent care setting.
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
## 2024-01-11 NOTE — ED Triage Notes (Signed)
 Pt states lower abdominal pain  and pain in her vagina.  States she is one week post op from having fibroids removed from her ovaries.

## 2024-01-12 ENCOUNTER — Emergency Department (HOSPITAL_BASED_OUTPATIENT_CLINIC_OR_DEPARTMENT_OTHER)

## 2024-01-12 ENCOUNTER — Other Ambulatory Visit: Payer: Self-pay | Admitting: Licensed Clinical Social Worker
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
## 2024-01-12 NOTE — Patient Outreach (Signed)
 Pt recently had a procedure completed and is experiencing discomfort. Follow up call with LCSW will be rescheduled.
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
## 2024-01-18 ENCOUNTER — Ambulatory Visit
Admission: RE | Admit: 2024-01-18 | Discharge: 2024-01-18 | Disposition: A | Discharge status: Home / Self Care | Source: Ambulatory Visit | Attending: Family | Admitting: Family

## 2024-01-18 DIAGNOSIS — R928 Other abnormal and inconclusive findings on diagnostic imaging of breast: Secondary | ICD-10-CM

## 2024-01-19 ENCOUNTER — Other Ambulatory Visit: Payer: Self-pay | Admitting: Family

## 2024-01-19 DIAGNOSIS — R599 Enlarged lymph nodes, unspecified: Secondary | ICD-10-CM

## 2024-01-23 ENCOUNTER — Encounter: Payer: Self-pay | Admitting: Orthopedic Surgery

## 2024-01-23 ENCOUNTER — Ambulatory Visit (INDEPENDENT_AMBULATORY_CARE_PROVIDER_SITE_OTHER): Admitting: Orthopedic Surgery

## 2024-01-23 VITALS — BP 136/88 | HR 88 | Temp 97.6°F | Resp 18 | Ht 62.0 in | Wt 278.8 lb

## 2024-01-23 DIAGNOSIS — B3731 Acute candidiasis of vulva and vagina: Secondary | ICD-10-CM

## 2024-01-23 DIAGNOSIS — N946 Dysmenorrhea, unspecified: Secondary | ICD-10-CM

## 2024-01-23 DIAGNOSIS — F17200 Nicotine dependence, unspecified, uncomplicated: Secondary | ICD-10-CM

## 2024-01-23 DIAGNOSIS — R102 Pelvic and perineal pain: Secondary | ICD-10-CM

## 2024-01-23 DIAGNOSIS — I10 Essential (primary) hypertension: Secondary | ICD-10-CM | POA: Diagnosis not present

## 2024-01-23 MED ORDER — AMLODIPINE BESYLATE 5 MG PO TABS: 5.0000 mg | ORAL_TABLET | Freq: Every day | ORAL | 1 refills | Status: AC

## 2024-01-23 MED ORDER — NYSTATIN 100000 UNIT/GM EX CREA: 1.0000 | TOPICAL_CREAM | Freq: Two times a day (BID) | CUTANEOUS | 1 refills | Status: DC

## 2024-01-23 MED ORDER — NORETHINDRONE ACETATE 5 MG PO TABS: 10.0000 mg | ORAL_TABLET | Freq: Every day | ORAL | 6 refills | Status: AC

## 2024-01-23 NOTE — Patient Instructions (Addendum)
 Continue current blood pressured medication  Try cream to vaginal area> apply where is hurts 2 times daily until healed  Please try Chantix > benefits outweigh risks> let us  know if you need more
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
## 2024-01-26 ENCOUNTER — Other Ambulatory Visit: Payer: Self-pay | Admitting: Licensed Clinical Social Worker

## 2024-01-26 NOTE — Patient Instructions (Signed)
 Visit Information  Thank you for taking time to visit with me today. Please don't hesitate to contact me if I can be of assistance to you before our next scheduled appointment.  Your next care management appointment is by telephone on 07/24 at 3 PM  Please call the care guide team at [phone] if you need to cancel, schedule, or reschedule an appointment.   Please call the Suicide and Crisis Lifeline: 988 go to [facility] [address] [place] [phone]) call 911 if you are experiencing a Mental Health or Behavioral Health Crisis or need someone to talk to.  Rolin Kerns, LCSW [place]  [facility], [facility] Clinical Social Worker Direct Dial: [phone]  Fax: [phone] Website: [email or web address] 3:44 PM

## 2024-01-26 NOTE — Patient Outreach (Signed)
 Complex Care Management   Visit Note  01/26/2024  Name:  Gabriela Best MRN: [id number] DOB: 1980/11/06  Situation: Referral received for Complex Care Management related to Stress I obtained verbal consent from Patient.  Visit completed with pt  on the phone  Background:   Past Medical History:  Diagnosis Date   Abnormal menstrual cycle    Acute perforated appendicitis 11/14/2020   Herpes simplex type 2 infection    Hypertension    Migraine    Ovarian cyst    Symptomatic cholelithiasis 11/13/2020    Assessment: Patient Reported Symptoms:  Cognitive Cognitive Status: No symptoms reported, Alert and oriented to person, place, and time, Normal speech and language skills Cognitive/Intellectual Conditions Management [RPT]: None reported or documented in medical history or problem list      Neurological Neurological Review of Symptoms: No symptoms reported    HEENT HEENT Symptoms Reported: No symptoms reported      Cardiovascular Cardiovascular Symptoms Reported: No symptoms reported    Respiratory Respiratory Symptoms Reported: No symptoms reported    Endocrine Endocrine Symptoms Reported: Not assessed    Gastrointestinal Gastrointestinal Symptoms Reported: Not assessed      Genitourinary      Integumentary Integumentary Symptoms Reported: Not assessed    Musculoskeletal Musculoskelatal Symptoms Reviewed: Not assessed        Psychosocial Psychosocial Symptoms Reported: Other Other Psychosocial Conditions: Stress Additional Psychological Details: Patient identified triggers to current stressors. Strategies discussed to assist with management of symptoms, including mindfulness and grounding exercises Behavioral Management Strategies: Adequate rest, Coping strategies, Support system Major Change/Loss/Stressor/Fears (CP): Medical condition, self, Relationship concerns, Traumatic event Techniques to Cope with Loss/Stress/Change: Diversional activities        12/05/2023     2:54 PM  Depression screen PHQ 2/9  Decreased Interest 0  Down, Depressed, Hopeless 0  PHQ - 2 Score 0    There were no vitals filed for this visit.  Medications Reviewed Today     Reviewed by Ezzard Rolin BIRCH, LCSW (Social Worker) on 01/26/24 at 1531  Med List Status: <None>   Medication Order Taking? Sig Documenting Provider Last Dose Status Informant  acetaminophen  (TYLENOL ) 500 MG tablet [id number]  Take 2 tablets (1,000 mg total) by mouth every 6 (six) hours as needed for moderate pain (pain score 4-6) or mild pain (pain score 1-3). Ajewole, Christana, MD  Active   amLODipine  (NORVASC ) 5 MG tablet [id number]  Take 1 tablet (5 mg total) by mouth daily. Fargo, Amy E, NP  Active   buPROPion  (WELLBUTRIN  SR) 150 MG 12 hr tablet [id number]  Take 1 tablet (150 mg total) by mouth daily. Ngetich, Dinah C, NP  Active   cyclobenzaprine  (FLEXERIL ) 10 MG tablet [id number]  Take 0.5 tablets (5 mg total) by mouth 3 (three) times daily as needed for muscle spasms. Danton Slough M, PA-C  Active   loratadine (CLARITIN) 5 MG chewable tablet [id number]  Chew 5 mg by mouth as needed for allergies. For allergies [provider]  Active   norethindrone  (AYGESTIN ) 5 MG tablet [id number]  Take 2 tablets (10 mg total) by mouth daily. Gil No E, NP  Active   nystatin  cream (MYCOSTATIN ) [id number]  Apply 1 Application topically 2 (two) times daily. Fargo, Amy E, NP  Active   pseudoephedrine (SUDAFED) 30 MG tablet [id number]  Take 30 mg by mouth as needed for congestion. For allergies as needed [provider]  Active   Med List Note Efraim Alfreida LITTIE Bishop 05/08/23 1551): Patient  not taking any medications other than Vitamin D             Recommendation:   Continue Current Plan of Care  Follow Up Plan:   Telephone follow-up 2-4 weeks  Rolin Kerns, LCSW [place]  [facility], [facility] Clinical Social Worker Direct Dial: [phone]  Fax:  [phone] Website: [email or web address] 3:44 PM

## 2024-02-02 ENCOUNTER — Ambulatory Visit: Admitting: Obstetrics and Gynecology

## 2024-02-02 VITALS — BP 120/84 | HR 77 | Wt 269.0 lb

## 2024-02-02 DIAGNOSIS — N946 Dysmenorrhea, unspecified: Secondary | ICD-10-CM

## 2024-02-02 DIAGNOSIS — Z1331 Encounter for screening for depression: Secondary | ICD-10-CM | POA: Diagnosis not present

## 2024-02-02 DIAGNOSIS — Z09 Encounter for follow-up examination after completed treatment for conditions other than malignant neoplasm: Secondary | ICD-10-CM

## 2024-02-02 DIAGNOSIS — N939 Abnormal uterine and vaginal bleeding, unspecified: Secondary | ICD-10-CM
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
## 2024-02-02 NOTE — Patient Instructions (Signed)
 Continue aygestin  for another 5 months

## 2024-02-07 ENCOUNTER — Ambulatory Visit
Admission: RE | Admit: 2024-02-07 | Discharge: 2024-02-07 | Disposition: A | Discharge status: Home / Self Care | Source: Ambulatory Visit | Attending: Family | Admitting: Family

## 2024-02-07 ENCOUNTER — Other Ambulatory Visit (HOSPITAL_COMMUNITY)
Admission: RE | Admit: 2024-02-07 | Discharge: 2024-02-07 | Disposition: A | Discharge status: Home / Self Care | Source: Ambulatory Visit | Attending: Family | Admitting: Family

## 2024-02-07 DIAGNOSIS — R590 Localized enlarged lymph nodes: Secondary | ICD-10-CM | POA: Insufficient documentation

## 2024-02-07 DIAGNOSIS — R599 Enlarged lymph nodes, unspecified: Secondary | ICD-10-CM

## 2024-02-09 ENCOUNTER — Other Ambulatory Visit: Payer: Self-pay | Admitting: Licensed Clinical Social Worker
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
## 2024-02-13 NOTE — Patient Outreach (Signed)
 Complex Care Management   Visit Note  02/09/2024  Name:  Gabriela Best MRN: [id number] DOB: 1981/05/20  Situation: Referral received for Complex Care Management related to Stress Management. I obtained verbal consent from Patient.  Visit completed with pt  on the phone  Background:   Past Medical History:  Diagnosis Date   Abnormal menstrual cycle    Acute perforated appendicitis 11/14/2020   Herpes simplex type 2 infection    Hypertension    Migraine    Ovarian cyst    Symptomatic cholelithiasis 11/13/2020    Assessment: Patient Reported Symptoms:  Cognitive Cognitive Status: Alert and oriented to person, place, and time, Normal speech and language skills Cognitive/Intellectual Conditions Management [RPT]: None reported or documented in medical history or problem list      Neurological Neurological Review of Symptoms: Not assessed    HEENT HEENT Symptoms Reported: Not assessed      Cardiovascular Cardiovascular Symptoms Reported: Not assessed    Respiratory Respiratory Symptoms Reported: Not assesed    Endocrine Endocrine Symptoms Reported: Not assessed    Gastrointestinal Gastrointestinal Symptoms Reported: Not assessed      Genitourinary Genitourinary Symptoms Reported: Not assessed    Integumentary Integumentary Symptoms Reported: Not assessed    Musculoskeletal Musculoskelatal Symptoms Reviewed: Not assessed        Psychosocial Psychosocial Symptoms Reported: Other Other Psychosocial Conditions: Stress Additional Psychological Details: Pt processed current stressors and LCSW discussed strategies to assist with establishing goals related to growth mindset Behavioral Management Strategies: Adequate rest, Coping strategies, Support system Major Change/Loss/Stressor/Fears (CP): Medical condition, self, Relationship concerns, Environment Techniques to Cope with Loss/Stress/Change: Diversional activities        02/02/2024   10:45 AM  Depression screen PHQ 2/9   Decreased Interest 1  Down, Depressed, Hopeless 0  PHQ - 2 Score 1  Altered sleeping 2  Tired, decreased energy 2  Change in appetite 0  Feeling bad or failure about yourself  0  Trouble concentrating 0  Moving slowly or fidgety/restless 0  Suicidal thoughts 0  PHQ-9 Score 5    There were no vitals filed for this visit.  Medications Reviewed Today     Reviewed by Sharhonda Atwood D, LCSW (Social Worker) on 02/13/24 at 1402  Med List Status: <None>   Medication Order Taking? Sig Documenting Provider Last Dose Status Informant  acetaminophen  (TYLENOL ) 500 MG tablet [id number]  Take 2 tablets (1,000 mg total) by mouth every 6 (six) hours as needed for moderate pain (pain score 4-6) or mild pain (pain score 1-3). Ajewole, Christana, MD  Active   amLODipine  (NORVASC ) 5 MG tablet [id number]  Take 1 tablet (5 mg total) by mouth daily. Fargo, Amy E, NP  Active   buPROPion  (WELLBUTRIN  SR) 150 MG 12 hr tablet [id number]  Take 1 tablet (150 mg total) by mouth daily.  Patient not taking: Reported on 02/02/2024   Ngetich, Dinah C, NP  Active   cyclobenzaprine  (FLEXERIL ) 10 MG tablet [id number]  Take 0.5 tablets (5 mg total) by mouth 3 (three) times daily as needed for muscle spasms.  Patient not taking: Reported on 02/02/2024   McClung, Angela M, PA-C  Active   loratadine (CLARITIN) 5 MG chewable tablet [id number]  Chew 5 mg by mouth as needed for allergies. For allergies  Patient not taking: Reported on 02/02/2024   [provider]  Active   norethindrone  (AYGESTIN ) 5 MG tablet [id number]  Take 2 tablets (10 mg total) by mouth daily. Gil Greig BRAVO, NP  Active   nystatin  cream (MYCOSTATIN ) [id number]  Apply 1 Application topically 2 (two) times daily.  Patient not taking: Reported on 02/02/2024   Gil Greig BRAVO, NP  Active   pseudoephedrine (SUDAFED) 30 MG tablet [id number]  Take 30 mg by mouth as needed for congestion. For allergies as needed  Patient not taking: Reported on 02/02/2024   [provider]  Active   Med List Note Efraim Alfreida LITTIE Bishop 05/08/23 1551): Patient not taking any medications other than Vitamin D             Recommendation:   Continue Current Plan of Care  Follow Up Plan:   Telephone follow-up 2-4 weeks  Rolin Kerns, LCSW [place]  [facility], [facility] Clinical Social Worker Direct Dial: [phone]  Fax: [phone] Website: [email or web address] 2:09 PM

## 2024-02-14 LAB — SURGICAL PATHOLOGY

## 2024-03-09 ENCOUNTER — Other Ambulatory Visit: Payer: Self-pay | Admitting: Licensed Clinical Social Worker

## 2024-03-16 NOTE — Patient Outreach (Signed)
 Complex Care Management   Visit Note  03/09/2024  Name:  Gabriela Best MRN: [id number] DOB: Nov 30, 1980  Situation: Referral received for Complex Care Management related to Stress I obtained verbal consent from Patient.  Visit completed with Patient  on the phone  Background:   Past Medical History:  Diagnosis Date   Abnormal menstrual cycle    Acute perforated appendicitis 11/14/2020   Herpes simplex type 2 infection    Hypertension    Migraine    Ovarian cyst    Symptomatic cholelithiasis 11/13/2020    Assessment: Patient Reported Symptoms:  Cognitive Cognitive Status: No symptoms reported, Alert and oriented to person, place, and time, Normal speech and language skills Cognitive/Intellectual Conditions Management [RPT]: None reported or documented in medical history or problem list      Neurological Neurological Review of Symptoms: No symptoms reported    HEENT HEENT Symptoms Reported: No symptoms reported      Cardiovascular Cardiovascular Symptoms Reported: Not assessed    Respiratory Respiratory Symptoms Reported: Not assesed    Endocrine Endocrine Symptoms Reported: No symptoms reported Is patient diabetic?: No    Gastrointestinal Gastrointestinal Symptoms Reported: No symptoms reported      Genitourinary Genitourinary Symptoms Reported: No symptoms reported    Integumentary Integumentary Symptoms Reported: No symptoms reported    Musculoskeletal Musculoskelatal Symptoms Reviewed: Not assessed        Psychosocial Psychosocial Symptoms Reported: Other Other Psychosocial Conditions: Stress Behavioral Management Strategies: Adequate rest, Coping strategies, Support system Major Change/Loss/Stressor/Fears (CP): Medical condition, self, Relationship concerns Techniques to Cope with Loss/Stress/Change: Diversional activities      03/16/2024    PHQ2-9 Depression Screening   Little interest or pleasure in doing things    Feeling down, depressed, or hopeless     PHQ-2 - Total Score    Trouble falling or staying asleep, or sleeping too much    Feeling tired or having little energy    Poor appetite or overeating     Feeling bad about yourself - or that you are a failure or have let yourself or your family down    Trouble concentrating on things, such as reading the newspaper or watching television    Moving or speaking so slowly that other people could have noticed.  Or the opposite - being so fidgety or restless that you have been moving around a lot more than usual    Thoughts that you would be better off dead, or hurting yourself in some way    PHQ2-9 Total Score    If you checked off any problems, how difficult have these problems made it for you to do your work, take care of things at home, or get along with other people    Depression Interventions/Treatment      There were no vitals filed for this visit.  Medications Reviewed Today     Reviewed by Temara Lanum D, LCSW (Social Worker) on 03/16/24 at 0532  Med List Status: <None>   Medication Order Taking? Sig Documenting Provider Last Dose Status Informant  acetaminophen  (TYLENOL ) 500 MG tablet [id number]  Take 2 tablets (1,000 mg total) by mouth every 6 (six) hours as needed for moderate pain (pain score 4-6) or mild pain (pain score 1-3). Ajewole, Christana, MD  Active   amLODipine  (NORVASC ) 5 MG tablet [id number]  Take 1 tablet (5 mg total) by mouth daily. Fargo, Amy E, NP  Active   buPROPion  (WELLBUTRIN  SR) 150 MG 12 hr tablet [id number]  Take 1 tablet (150  mg total) by mouth daily.  Patient not taking: Reported on 02/02/2024   Ngetich, Dinah C, NP  Active   cyclobenzaprine  (FLEXERIL ) 10 MG tablet [id number]  Take 0.5 tablets (5 mg total) by mouth 3 (three) times daily as needed for muscle spasms.  Patient not taking: Reported on 02/02/2024   McClung, Angela M, PA-C  Active   loratadine (CLARITIN) 5 MG chewable tablet [id number]  Chew 5 mg by mouth as needed for allergies. For allergies   Patient not taking: Reported on 02/02/2024   [provider]  Active   norethindrone  (AYGESTIN ) 5 MG tablet [id number]  Take 2 tablets (10 mg total) by mouth daily. Gil No E, NP  Active   nystatin  cream (MYCOSTATIN ) [id number]  Apply 1 Application topically 2 (two) times daily.  Patient not taking: Reported on 02/02/2024   Gil No BRAVO, NP  Active   pseudoephedrine (SUDAFED) 30 MG tablet [id number]  Take 30 mg by mouth as needed for congestion. For allergies as needed  Patient not taking: Reported on 02/02/2024   [provider]  Active   Med List Note Efraim Alfreida CROME, CPhT 05/08/23 1551): Patient not taking any medications other than Vitamin D             Recommendation:   Continue Current Plan of Care  Follow Up Plan:   Telephone follow-up in 1 month  Rolin Ezzard HUGHS [facility]  [facility], [facility] Clinical Social Worker Direct Dial: [phone]  Fax: [phone] Website: [email or web address] 5:37 AM

## 2024-03-16 NOTE — Patient Instructions (Signed)
 Visit Information  Ms. Falter was given information about Medicaid Managed Care team care coordination services as a part of their [facility] Community Plan Medicaid benefit.   If you would like to schedule transportation through your [facility], please call the following number at least 2 days in advance of your appointment: [phone]   Rides for urgent appointments can also be made after hours by calling Member Services.  Call the Behavioral Health Crisis Line at [phone], at any time, 24 hours a day, 7 days a week. If you are in danger or need immediate medical attention call 911.   Ms. Simerly - following are the goals we discussed in your visit today:   Goals Addressed             This Visit's Progress    LCSW VBCI Social Work Care Plan   On track    Problems:   Stress Management  CSW Clinical Goal(s):   Over the next 90 days the Patient will attend all scheduled medical appointments as evidenced by patient report and care team review of appointment completion in electronic MEDICAL RECORD NUMBER  demonstrate a reduction in symptoms related to Stress at per pt report .  Interventions:  Mental Health:  Evaluation of current treatment plan related to Stress.  Active listening / Reflection utilized Emotional Support Provided Mindfulness or Relaxation training provided Stress management strategies discussed  Patient Goals/Self-Care Activities:  Increase coping skills, healthy habits, and self-management skills  Plan:   Telephone follow up appointment with care management team member scheduled for:  2-4 weeks        Please see education materials related to topics discussed provided by MyChart link.  Patient verbalizes understanding of instructions and care plan provided today and agrees to view in MyChart. Active MyChart status and patient understanding of how to access instructions and care plan via MyChart confirmed with patient.      Licensed Clinical Social Worker will call pt on 09/26 at 3 PM  Rolin Kerns, LCSW [place]  [facility], [facility] Clinical Social Worker Direct Dial: [phone]  Fax: [phone] Website: [email or web address] 5:39 AM   Following is a copy of your plan of care:  There are no care plans that you recently modified to display for this patient.

## 2024-04-09 ENCOUNTER — Emergency Department
Admission: EM | Admit: 2024-04-09 | Discharge: 2024-04-09 | Disposition: A | Discharge status: Home / Self Care | Attending: Emergency Medicine | Admitting: Emergency Medicine

## 2024-04-09 ENCOUNTER — Emergency Department

## 2024-04-09 ENCOUNTER — Other Ambulatory Visit: Payer: Self-pay

## 2024-04-09 DIAGNOSIS — M25462 Effusion, left knee: Secondary | ICD-10-CM | POA: Insufficient documentation

## 2024-04-09 DIAGNOSIS — M25562 Pain in left knee: Secondary | ICD-10-CM | POA: Diagnosis present

## 2024-04-09 MED ORDER — KETOROLAC TROMETHAMINE 15 MG/ML IJ SOLN
15.0000 mg | Freq: Once | INTRAMUSCULAR | Status: AC
  Administered 2024-04-09: 15 mg via INTRAMUSCULAR
  Filled 2024-04-09: qty 1

## 2024-04-09 MED ORDER — MELOXICAM 15 MG PO TABS: 15.0000 mg | ORAL_TABLET | Freq: Every day | ORAL | 0 refills | Status: DC

## 2024-04-09 MED ORDER — OXYCODONE-ACETAMINOPHEN 5-325 MG PO TABS: 1.0000 | ORAL_TABLET | ORAL | 0 refills | Status: DC | PRN

## 2024-04-09 NOTE — ED Notes (Signed)
 Ace wrap applied to pt's left knee. Walker given to pt and explained to pt how to use it.

## 2024-04-09 NOTE — ED Provider Notes (Signed)
 [facility] Provider Note    Event Date/Time   First MD Initiated Contact with Patient 04/09/24 2054     (approximate)   History   Knee Pain   HPI  Gabriela Best is a 43 y.o. female with history of previous left knee surgery presents emergency department complaint of left knee pain.  States knee gave out today.  Having severe pain in the knee.  Mostly in the posterior.  No numbness or tingling.  States very painful to bear weight and to extend the leg      Physical Exam   Triage Vital Signs: ED Triage Vitals  Encounter Vitals Group     BP 04/09/24 1808 129/74     Girls Systolic BP Percentile --      Girls Diastolic BP Percentile --      Boys Systolic BP Percentile --      Boys Diastolic BP Percentile --      Pulse Rate 04/09/24 1808 80     Resp --      Temp 04/09/24 1808 98 F (36.7 C)     Temp src --      SpO2 04/09/24 1808 100 %     Weight 04/09/24 1807 270 lb (122.5 kg)     Height 04/09/24 1807 5' 3 (1.6 m)     Head Circumference --      Peak Flow --      Pain Score 04/09/24 1807 10     Pain Loc --      Pain Education --      Exclude from Growth Chart --     Most recent vital signs: Vitals:   04/09/24 1808  BP: 129/74  Pulse: 80  Temp: 98 F (36.7 C)  SpO2: 100%     General: Awake, no distress.   CV:  Good peripheral perfusion.  Resp:  Normal effort.  Abd:  No distention.   Other:  Left knee is swollen, tender, decreased range of motion secondary discomfort, no calf tenderness, no bulge noted behind the left knee, neurovascular is intact   ED Results / Procedures / Treatments   Labs (all labs ordered are listed, but only abnormal results are displayed) Labs Reviewed - No data to display   EKG     RADIOLOGY X-ray of the left knee    PROCEDURES:   Procedures  Critical Care:  no Chief Complaint  Patient presents with   Knee Pain      MEDICATIONS ORDERED IN ED: Medications  ketorolac  (TORADOL ) 15  MG/ML injection 15 mg (has no administration in time range)     IMPRESSION / MDM / ASSESSMENT AND PLAN / ED COURSE  I reviewed the triage vital signs and the nursing notes.                              Differential diagnosis includes, but is not limited to, sprain, contusion, fracture, meniscus tear, Baker's cyst, ACL tear  Patient's presentation is most consistent with acute illness / injury with system symptoms.   Medications given: Toradol  15 mg IM  X-ray of the left knee independently reviewed interpreted by me as being negative for any acute abnormality  Due to the patient's discomfort we will give her Toradol  15 mg IM.  Due to the amount of swelling we will apply to Ace wrap's.  Knee is large I do not feel a knee immobilizer would be appropriate.  Also feel that a walker would help the patient more than crutches.  She will be given a standard walker here in the ED.  She is to apply ice.  Elevate.  I did send pain medication and anti-inflammatory to her pharmacy.  She is in agreement with this treatment plan.  Ice pack was given.  Discharged stable condition.      FINAL CLINICAL IMPRESSION(S) / ED DIAGNOSES   Final diagnoses:  Effusion of left knee  Acute pain of left knee     Rx / DC Orders   ED Discharge Orders          Ordered    meloxicam  (MOBIC ) 15 MG tablet  Daily        04/09/24 2123    oxyCODONE -acetaminophen  (PERCOCET) 5-325 MG tablet  Every 4 hours PRN        04/09/24 2123             Note:  This document was prepared using Dragon voice recognition software and may include unintentional dictation errors.    Gasper Devere ORN, PA-C 04/09/24 2129    Floy Roberts, MD 04/09/24 2200

## 2024-04-09 NOTE — ED Triage Notes (Signed)
 Pt comes with left knee pain. Pt was at work and it gave out . Pt states pain shooting down it .

## 2024-04-09 NOTE — Discharge Instructions (Signed)
 Follow-up with orthopedics.  Please call for an appointment.  Elevate and ice.  Use the walker to help you ambulate.  Bear weight as tolerated

## 2024-04-13 ENCOUNTER — Other Ambulatory Visit: Payer: Self-pay | Admitting: Licensed Clinical Social Worker

## 2024-04-17 DIAGNOSIS — G4736 Sleep related hypoventilation in conditions classified elsewhere: Secondary | ICD-10-CM | POA: Insufficient documentation

## 2024-04-20 NOTE — Patient Instructions (Signed)
 Visit Information  Gabriela Best was given information about Medicaid Managed Care team care coordination services as a part of their [facility] Community Plan Medicaid benefit.   If you would like to schedule transportation through your [facility], please call the following number at least 2 days in advance of your appointment: [phone]   Rides for urgent appointments can also be made after hours by calling Member Services.  Call the Behavioral Health Crisis Line at [phone], at any time, 24 hours a day, 7 days a week. If you are in danger or need immediate medical attention call 911.  Please see education materials related to topics discussed provided by MyChart link.  Patient verbalizes understanding of instructions and care plan provided today and agrees to view in MyChart. Active MyChart status and patient understanding of how to access instructions and care plan via MyChart confirmed with patient.     Licensed Clinical Social Worker will call on 11/6 at 3 PM  Rolin Kerns, LCSW [facility]  [facility], [facility] Clinical Social Worker Direct Dial: [phone]  Fax: [phone] Website: [email or web address] 5:50 PM   Following is a copy of your plan of care:  There are no care plans that you recently modified to display for this patient.

## 2024-04-20 NOTE — Patient Outreach (Signed)
 Complex Care Management   Visit Note  04/13/2024  Name:  Gabriela Best MRN: [id number] DOB: February 15, 1981  Situation: Referral received for Complex Care Management related to Stress I obtained verbal consent from Patient.  Visit completed with Patient  on the phone  Background:   Past Medical History:  Diagnosis Date   Abnormal menstrual cycle    Acute perforated appendicitis 11/14/2020   Herpes simplex type 2 infection    Hypertension    Migraine    Ovarian cyst    Symptomatic cholelithiasis 11/13/2020    Assessment: Patient Reported Symptoms:  Cognitive Cognitive Status: No symptoms reported, Alert and oriented to person, place, and time, Normal speech and language skills Cognitive/Intellectual Conditions Management [RPT]: None reported or documented in medical history or problem list      Neurological Neurological Review of Symptoms: No symptoms reported    HEENT HEENT Symptoms Reported: Not assessed      Cardiovascular Cardiovascular Symptoms Reported: Not assessed    Respiratory Respiratory Symptoms Reported: Not assesed    Endocrine Endocrine Symptoms Reported: Not assessed    Gastrointestinal Gastrointestinal Symptoms Reported: Not assessed      Genitourinary Genitourinary Symptoms Reported: Not assessed    Integumentary Integumentary Symptoms Reported: Not assessed    Musculoskeletal Musculoskelatal Symptoms Reviewed: Limited mobility, Unsteady gait, Joint pain Additional Musculoskeletal Details: Patient injured knee while at work and was seen at ED on 9/22 Musculoskeletal Management Strategies: Coping strategies, Medical device, Adequate rest   Patient at Risk for Falls Due to: Impaired mobility  Psychosocial Psychosocial Symptoms Reported: Other Other Psychosocial Conditions: Stress Behavioral Management Strategies: Adequate rest, Coping strategies Major Change/Loss/Stressor/Fears (CP): Medical condition, self      04/20/2024    PHQ2-9 Depression  Screening   Little interest or pleasure in doing things    Feeling down, depressed, or hopeless    PHQ-2 - Total Score    Trouble falling or staying asleep, or sleeping too much    Feeling tired or having little energy    Poor appetite or overeating     Feeling bad about yourself - or that you are a failure or have let yourself or your family down    Trouble concentrating on things, such as reading the newspaper or watching television    Moving or speaking so slowly that other people could have noticed.  Or the opposite - being so fidgety or restless that you have been moving around a lot more than usual    Thoughts that you would be better off dead, or hurting yourself in some way    PHQ2-9 Total Score    If you checked off any problems, how difficult have these problems made it for you to do your work, take care of things at home, or get along with other people    Depression Interventions/Treatment      There were no vitals filed for this visit.  Medications Reviewed Today     Reviewed by Ezzard Rolin BIRCH, LCSW (Social Worker) on 04/20/24 at 1740  Med List Status: <None>   Medication Order Taking? Sig Documenting Provider Last Dose Status Informant  acetaminophen  (TYLENOL ) 500 MG tablet [id number]  Take 2 tablets (1,000 mg total) by mouth every 6 (six) hours as needed for moderate pain (pain score 4-6) or mild pain (pain score 1-3). Ajewole, Christana, MD  Active   amLODipine  (NORVASC ) 5 MG tablet [id number]  Take 1 tablet (5 mg total) by mouth daily. Gil Greig BRAVO, NP  Active  buPROPion  (WELLBUTRIN  SR) 150 MG 12 hr tablet [id number]  Take 1 tablet (150 mg total) by mouth daily.  Patient not taking: Reported on 02/02/2024   Ngetich, Dinah C, NP  Active   cyclobenzaprine  (FLEXERIL ) 10 MG tablet [id number]  Take 0.5 tablets (5 mg total) by mouth 3 (three) times daily as needed for muscle spasms.  Patient not taking: Reported on 02/02/2024   McClung, Angela M, PA-C  Active   loratadine  (CLARITIN) 5 MG chewable tablet [id number]  Chew 5 mg by mouth as needed for allergies. For allergies  Patient not taking: Reported on 02/02/2024   [provider]  Active   meloxicam  (MOBIC ) 15 MG tablet [id number]  Take 1 tablet (15 mg total) by mouth daily. Gasper Devere ORN, PA-C  Active   norethindrone  (AYGESTIN ) 5 MG tablet [id number]  Take 2 tablets (10 mg total) by mouth daily. Gil No E, NP  Active   nystatin  cream (MYCOSTATIN ) [id number]  Apply 1 Application topically 2 (two) times daily.  Patient not taking: Reported on 02/02/2024   Gil No BRAVO, NP  Active   oxyCODONE -acetaminophen  (PERCOCET) 5-325 MG tablet [id number]  Take 1 tablet by mouth every 4 (four) hours as needed for severe pain (pain score 7-10). Gasper Devere ORN, PA-C  Active   pseudoephedrine (SUDAFED) 30 MG tablet [id number]  Take 30 mg by mouth as needed for congestion. For allergies as needed  Patient not taking: Reported on 02/02/2024   [provider]  Active   Med List Note Efraim Alfreida LITTIE Bishop 05/08/23 1551): Patient not taking any medications other than Vitamin D             Recommendation:   Continue Current Plan of Care  Follow Up Plan:   Telephone follow-up in 1 month  Rolin Ezzard HUGHS [facility]  [facility], [facility] Clinical Social Worker Direct Dial: [phone]  Fax: [phone] Website: [email or web address] 5:48 PM

## 2024-04-24 ENCOUNTER — Ambulatory Visit: Admitting: Family

## 2024-04-30 ENCOUNTER — Ambulatory Visit: Admitting: Family

## 2024-05-04 ENCOUNTER — Ambulatory Visit (HOSPITAL_COMMUNITY)
Admission: EM | Admit: 2024-05-04 | Discharge: 2024-05-04 | Disposition: A | Discharge status: Home / Self Care | Attending: Emergency Medicine | Admitting: Emergency Medicine

## 2024-05-04 ENCOUNTER — Encounter (HOSPITAL_COMMUNITY): Payer: Self-pay | Admitting: Emergency Medicine

## 2024-05-04 DIAGNOSIS — R1013 Epigastric pain: Secondary | ICD-10-CM | POA: Insufficient documentation

## 2024-05-04 DIAGNOSIS — R1030 Lower abdominal pain, unspecified: Secondary | ICD-10-CM | POA: Diagnosis present

## 2024-05-04 DIAGNOSIS — N3001 Acute cystitis with hematuria: Secondary | ICD-10-CM | POA: Diagnosis not present

## 2024-05-04 LAB — POCT URINALYSIS DIP (MANUAL ENTRY)
Bilirubin, UA: NEGATIVE
Glucose, UA: NEGATIVE mg/dL
Ketones, POC UA: NEGATIVE mg/dL
Leukocytes, UA: NEGATIVE
Nitrite, UA: NEGATIVE
Protein Ur, POC: NEGATIVE mg/dL
Spec Grav, UA: 1.025 (ref 1.010–1.025)
Urobilinogen, UA: 0.2 U/dL
pH, UA: 5.5 (ref 5.0–8.0)

## 2024-05-04 MED ORDER — ONDANSETRON 4 MG PO TBDP
4.0000 mg | ORAL_TABLET | Freq: Four times a day (QID) | ORAL | 0 refills | Status: DC | PRN
Start: 2024-05-04 — End: 2024-05-08

## 2024-05-04 MED ORDER — PANTOPRAZOLE SODIUM 40 MG PO TBEC: 40.0000 mg | DELAYED_RELEASE_TABLET | Freq: Every day | ORAL | 0 refills | Status: AC

## 2024-05-04 MED ORDER — KETOROLAC TROMETHAMINE 30 MG/ML IJ SOLN
INTRAMUSCULAR | Status: AC
  Filled 2024-05-04: qty 1

## 2024-05-04 MED ORDER — LIDOCAINE VISCOUS HCL 2 % MT SOLN
15.0000 mL | Freq: Once | OROMUCOSAL | Status: AC
  Administered 2024-05-04: 15 mL via OROMUCOSAL

## 2024-05-04 MED ORDER — NITROFURANTOIN MONOHYD MACRO 100 MG PO CAPS: 100.0000 mg | ORAL_CAPSULE | Freq: Two times a day (BID) | ORAL | 0 refills | Status: AC

## 2024-05-04 MED ORDER — LIDOCAINE VISCOUS HCL 2 % MT SOLN
OROMUCOSAL | Status: AC
  Filled 2024-05-04: qty 15

## 2024-05-04 MED ORDER — ALUM & MAG HYDROXIDE-SIMETH 200-200-20 MG/5ML PO SUSP
ORAL | Status: AC
  Filled 2024-05-04: qty 30

## 2024-05-04 MED ORDER — ALUM & MAG HYDROXIDE-SIMETH 200-200-20 MG/5ML PO SUSP
30.0000 mL | Freq: Once | ORAL | Status: AC
  Administered 2024-05-04: 30 mL via ORAL

## 2024-05-04 MED ADMIN — Ketorolac Tromethamine Inj 30 MG/ML: 30 mg | INTRAMUSCULAR | NDC 72266011801

## 2024-05-04 NOTE — Discharge Instructions (Addendum)
 The toradol  injection given today should hopefully relieve overall pain.  Upper belly pain: this could be reflux and/or an ulcer.  Take the protonix once daily for 14 days in a row. Take first thing in the morning on an empty stomach.   Lower belly pain: this could be a combination of a urinary tract infection (UTI) and also related to your ovarian cyst or uterine mass. Please take the antibiotic macrobid to treat for infection. Twice daily for 7 days. Take with food to avoid upset stomach. Avoid ibuprofen  as this can worsen reflux. Instead use tylenol .   The zofran  dissolving tablets can be used every 6 hours as needed for nausea. Try to drink lots of water! Bland diet like toast, rice, banana, etc   Please go to the emergency department if symptoms worsen or do not improve at all with these interventions.

## 2024-05-04 NOTE — ED Triage Notes (Addendum)
 Pt had intermittent lower abd pains for a week. Reports vomited last weekend and had some nausea. Denies urinary or bowels pt adds had some back pain last night.  Ibuprofen  made pain worse

## 2024-05-04 NOTE — ED Provider Notes (Signed)
 [facility]    CSN: [id number] Arrival date & time: 05/04/24  1603     History   Chief Complaint Chief Complaint  Patient presents with   Abdominal Pain    HPI Gabriela Best is a 43 y.o. female.  Lower abdominal pain for 4-5 days  Today she noticed blood in the urine, and dysuria.  At onset had nausea/vomiting. No vomiting recently but still nausea. Tolerating fluids.  Also reporting upper abdominal pain that started last night.  Acid reflux.  Normal BM, last was yesterday. No blood in stool.  Tried ibuprofen  but it increased her pain   History of uterine myoma and ovarian cyst. Reports her current low belly pain is similar to these.   LMP 10/10  Sees PCP in 4 days   Additionally right side low back pain. No known injury. History of this.   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   Pain in left shoulder 11/25/2023   Low back pain 11/25/2023   Vitamin D  deficiency 08/23/2023   Tobacco dependence 08/23/2023   Morbid obesity (HCC) 08/23/2023   Abnormal uterine bleeding 08/23/2023   Pain in left hand 08/10/2023   Prediabetes 04/14/2023   Hypertension 04/11/2023   BMI 45.0-49.9, adult (HCC) 04/11/2023    Past Surgical History:  Procedure Laterality Date   BREAST BIOPSY Left 01/14/2023   MM LT BREAST BX W LOC DEV 1ST LESION IMAGE BX SPEC STEREO GUIDE 01/14/2023 [facility]   CHOLECYSTECTOMY N/A 11/13/2020   Procedure: LAPAROSCOPIC CHOLECYSTECTOMY WITH INTRAOPERATIVE CHOLANGIOGRAM;  Surgeon: Lyndel Deward PARAS, MD;  Location: [facility];  Service: General;  Laterality: N/A;   KNEE SURGERY     KNEE SURGERY Left    RADIOFREQUENCY ABLATION, LEIOMYOMA, UTERUS,  TRANSCERVICAL APPROACH, WITH US  GUIDANCE N/A 01/03/2024   Procedure: RADIOFREQUENCY ABLATION, LEIOMYOMA, UTERUS, TRANSCERVICAL APPROACH, WITH US  GUIDANCE;  Surgeon: Jeralyn Crutch, MD;  Location: [facility];  Service: Gynecology;  Laterality: N/A;    OB History     Gravida  1   Para      Term      Preterm      AB  1   Living         SAB      IAB  1   Ectopic      Multiple      Live Births           Obstetric Comments  Age 5 y/o EAB, d&c          Home Medications    Prior to Admission medications   Medication Sig Start Date End Date Taking? Authorizing Provider  nitrofurantoin, macrocrystal-monohydrate, (MACROBID) 100 MG capsule Take 1 capsule (100 mg total) by mouth 2 (two) times daily for 7 days. 05/04/24 05/11/24 Yes Jeshawn Melucci, [place], PA-C  ondansetron  (ZOFRAN -ODT) 4 MG disintegrating tablet Take 1 tablet (4 mg total) by mouth every 6 (six) hours as needed for nausea or vomiting. 05/04/24  Yes Sarah Baez, [place], PA-C  pantoprazole (PROTONIX) 40 MG tablet Take 1 tablet (40 mg total) by mouth daily for 14 days. 05/04/24 05/18/24 Yes Ayla Dunigan, [place], PA-C  acetaminophen  (TYLENOL ) 500 MG tablet Take 2 tablets (1,000  mg total) by mouth every 6 (six) hours as needed for moderate pain (pain score 4-6) or mild pain (pain score 1-3). 01/03/24   Ajewole, Christana, MD  amLODipine  (NORVASC ) 5 MG tablet Take 1 tablet (5 mg total) by mouth daily. 01/23/24   Fargo, Amy E, NP  norethindrone  (AYGESTIN ) 5 MG tablet Take 2 tablets (10 mg total) by mouth daily. 01/23/24   Gil Greig BRAVO, NP  oxyCODONE -acetaminophen  (PERCOCET) 5-325 MG tablet Take 1 tablet by mouth every 4 (four) hours as needed for severe pain (pain score 7-10). 04/09/24 04/09/25  Fisher, Devere ORN, PA-C    Family History Family History  Problem Relation Age of Onset   Breast cancer Maternal Grandmother     Social History Social History   Tobacco Use   Smoking status: Every Day    Current packs/day: 0.15    Types:  Cigarettes   Smokeless tobacco: Never  Vaping Use   Vaping status: Never Used  Substance Use Topics   Alcohol use: Yes    Comment: occ   Drug use: Never     Allergies   Lemon juice [lemon oil], Lactose intolerance (gi), and Lime oil   Review of Systems Review of Systems  Gastrointestinal:  Positive for abdominal pain.   As per HPI  Physical Exam Triage Vital Signs ED Triage Vitals  Encounter Vitals Group     BP 05/04/24 1717 127/83     Girls Systolic BP Percentile --      Girls Diastolic BP Percentile --      Boys Systolic BP Percentile --      Boys Diastolic BP Percentile --      Pulse Rate 05/04/24 1717 75     Resp 05/04/24 1717 20     Temp 05/04/24 1717 99.5 F (37.5 C)     Temp Source 05/04/24 1717 Oral     SpO2 05/04/24 1717 98 %     Weight --      Height --      Head Circumference --      Peak Flow --      Pain Score 05/04/24 1716 8     Pain Loc --      Pain Education --      Exclude from Growth Chart --    No data found.  Updated Vital Signs BP 127/83 (BP Location: Left Arm)   Pulse 75   Temp 99.5 F (37.5 C) (Oral)   Resp 20   LMP 04/27/2024   SpO2 98%    Physical Exam Vitals and nursing note reviewed.  Constitutional:      General: She is not in acute distress.    Appearance: Normal appearance.  HENT:     Mouth/Throat:     Mouth: Mucous membranes are moist.     Pharynx: Oropharynx is clear.  Eyes:     Conjunctiva/sclera: Conjunctivae normal.  Cardiovascular:     Rate and Rhythm: Normal rate and regular rhythm.     Heart sounds: Normal heart sounds.  Pulmonary:     Effort: Pulmonary effort is normal. No respiratory distress.     Breath sounds: Normal breath sounds.  Abdominal:     General: Bowel sounds are normal.     Palpations: Abdomen is soft.     Tenderness: There is abdominal tenderness in the epigastric area and suprapubic area. There is no right CVA tenderness, left CVA tenderness, guarding or rebound.  Musculoskeletal:         General: Normal  range of motion.  Skin:    General: Skin is warm and dry.  Neurological:     Mental Status: She is alert and oriented to person, place, and time.     UC Treatments / Results  Labs (all labs ordered are listed, but only abnormal results are displayed) Labs Reviewed  POCT URINALYSIS DIP (MANUAL ENTRY) - Abnormal; Notable for the following components:      Result Value   Color, UA straw (*)    Clarity, UA hazy (*)    Blood, UA moderate (*)    All other components within normal limits  URINE CULTURE    EKG  Radiology No results found.  Procedures Procedures   Medications Ordered in UC Medications  ketorolac  (TORADOL ) 30 MG/ML injection 30 mg (30 mg Intramuscular Given 05/04/24 1822)  alum & mag hydroxide-simeth (MAALOX/MYLANTA) 200-200-20 MG/5ML suspension 30 mL (30 mLs Oral Given 05/04/24 1820)  lidocaine  (XYLOCAINE ) 2 % viscous mouth solution 15 mL (15 mLs Mouth/Throat Given 05/04/24 1820)    Initial Impression / Assessment and Plan / UC Course  I have reviewed the triage vital signs and the nursing notes.  Pertinent labs & imaging results that were available during my care of the patient were reviewed by me and considered in my medical decision making (see chart for details).  Stable vitals GI cocktail and IM toradol  given for pain. On reassessment patient feeling much improved.   UA with moderate blood, will culture Macrobid BID x 7 days for likely UTI. Suspect this is contributing to lower abdominal pain, along with cyst/myoma. Avoid ibuprofen , use tylenol , call ob/gyn for follow up  Upper epigastric pain could be reflux/ulcer. Reassuring improved with maalox and lidocaine . Protonix once daily, increase fluids, bland diet.  Zofran  is also sent for nausea  Strict ED precautions are verbalized  Final Clinical Impressions(s) / UC Diagnoses   Final diagnoses:  Abdominal pain, epigastric  Lower abdominal pain  Acute cystitis with hematuria      Discharge Instructions      The toradol  injection given today should hopefully relieve overall pain.  Upper belly pain: this could be reflux and/or an ulcer.  Take the protonix once daily for 14 days in a row. Take first thing in the morning on an empty stomach.   Lower belly pain: this could be a combination of a urinary tract infection (UTI) and also related to your ovarian cyst or uterine mass. Please take the antibiotic macrobid to treat for infection. Twice daily for 7 days. Take with food to avoid upset stomach. Avoid ibuprofen  as this can worsen reflux. Instead use tylenol .   The zofran  dissolving tablets can be used every 6 hours as needed for nausea. Try to drink lots of water! Bland diet like toast, rice, banana, etc   Please go to the emergency department if symptoms worsen or do not improve at all with these interventions.     ED Prescriptions     Medication Sig Dispense Auth. Provider   pantoprazole (PROTONIX) 40 MG tablet Take 1 tablet (40 mg total) by mouth daily for 14 days. 14 tablet Kaid Seeberger, PA-C   ondansetron  (ZOFRAN -ODT) 4 MG disintegrating tablet Take 1 tablet (4 mg total) by mouth every 6 (six) hours as needed for nausea or vomiting. 20 tablet Travius Crochet, PA-C   nitrofurantoin, macrocrystal-monohydrate, (MACROBID) 100 MG capsule Take 1 capsule (100 mg total) by mouth 2 (two) times daily for 7 days. 14 capsule Jashon Ishida, [place], PA-C  PDMP not reviewed this encounter.   Jeryl Stabs, [place] 05/04/24 [id number]

## 2024-05-05 LAB — URINE CULTURE: Culture: NO GROWTH

## 2024-05-08 ENCOUNTER — Encounter: Payer: Self-pay | Admitting: Family

## 2024-05-08 ENCOUNTER — Ambulatory Visit: Admitting: Family

## 2024-05-08 VITALS — BP 138/84 | HR 72 | Temp 97.9°F | Resp 20 | Ht 63.0 in | Wt 280.4 lb

## 2024-05-08 DIAGNOSIS — E785 Hyperlipidemia, unspecified: Secondary | ICD-10-CM

## 2024-05-08 DIAGNOSIS — F17200 Nicotine dependence, unspecified, uncomplicated: Secondary | ICD-10-CM | POA: Diagnosis not present

## 2024-05-08 DIAGNOSIS — L304 Erythema intertrigo: Secondary | ICD-10-CM

## 2024-05-08 DIAGNOSIS — I10 Essential (primary) hypertension: Secondary | ICD-10-CM | POA: Diagnosis not present

## 2024-05-08 DIAGNOSIS — G8929 Other chronic pain: Secondary | ICD-10-CM

## 2024-05-08 DIAGNOSIS — Z0289 Encounter for other administrative examinations: Secondary | ICD-10-CM

## 2024-05-08 DIAGNOSIS — M25562 Pain in left knee: Secondary | ICD-10-CM

## 2024-05-08 DIAGNOSIS — Z789 Other specified health status: Secondary | ICD-10-CM

## 2024-05-08 DIAGNOSIS — M4310 Spondylolisthesis, site unspecified: Secondary | ICD-10-CM | POA: Insufficient documentation

## 2024-05-08 DIAGNOSIS — M4302 Spondylolysis, cervical region: Secondary | ICD-10-CM | POA: Insufficient documentation

## 2024-05-08 DIAGNOSIS — R7303 Prediabetes: Secondary | ICD-10-CM

## 2024-05-08 LAB — COMPREHENSIVE METABOLIC PANEL WITH GFR
AG Ratio: 1.7 (calc) (ref 1.0–2.5)
ALT: 11 U/L (ref 6–29)
AST: 11 U/L (ref 10–30)
Albumin: 4.2 g/dL (ref 3.6–5.1)
Alkaline phosphatase (APISO): 63 U/L (ref 31–125)
BUN: 8 mg/dL (ref 7–25)
CO2: 26 mmol/L (ref 20–32)
Calcium: 9.2 mg/dL (ref 8.6–10.2)
Chloride: 104 mmol/L (ref 98–110)
Creat: 0.93 mg/dL (ref 0.50–0.99)
Globulin: 2.5 g/dL (ref 1.9–3.7)
Glucose, Bld: 87 mg/dL (ref 65–99)
Potassium: 4.8 mmol/L (ref 3.5–5.3)
Sodium: 138 mmol/L (ref 135–146)
Total Bilirubin: 0.5 mg/dL (ref 0.2–1.2)
Total Protein: 6.7 g/dL (ref 6.1–8.1)
eGFR: 79 mL/min/1.73m2 (ref >=60)

## 2024-05-08 LAB — CBC WITH DIFFERENTIAL/PLATELET
Absolute Lymphocytes: 2100 {cells}/uL (ref 850–3900)
Absolute Monocytes: 400 {cells}/uL (ref 200–950)
Basophils Absolute: 34 {cells}/uL (ref 0–200)
Basophils Relative: 0.4 %
Eosinophils Absolute: 153 {cells}/uL (ref 15–500)
Eosinophils Relative: 1.8 %
HCT: 40.5 % (ref 35.0–45.0)
Hemoglobin: 12.9 g/dL (ref 11.7–15.5)
MCH: 27.7 pg (ref 27.0–33.0)
MCHC: 31.9 g/dL — ABNORMAL LOW (ref 32.0–36.0)
MCV: 86.9 fL (ref 80.0–100.0)
MPV: 10.2 fL (ref 7.5–12.5)
Monocytes Relative: 4.7 %
Neutro Abs: 5814 {cells}/uL (ref 1500–7800)
Neutrophils Relative %: 68.4 %
Platelets: 421 Thousand/uL — ABNORMAL HIGH (ref 140–400)
RBC: 4.66 Million/uL (ref 3.80–5.10)
RDW: 15.3 % — ABNORMAL HIGH (ref 11.0–15.0)
Total Lymphocyte: 24.7 %
WBC: 8.5 Thousand/uL (ref 3.8–10.8)

## 2024-05-08 LAB — LIPID PANEL
Cholesterol: 152 mg/dL (ref <200)
HDL: 29 mg/dL — ABNORMAL LOW (ref >=50)
LDL Cholesterol (Calc): 107 mg/dL — ABNORMAL HIGH
Non-HDL Cholesterol (Calc): 123 mg/dL (ref <130)
Total CHOL/HDL Ratio: 5.2 (calc) — ABNORMAL HIGH (ref <5.0)
Triglycerides: 75 mg/dL (ref <150)

## 2024-05-08 LAB — HEMOGLOBIN A1C
Hgb A1c MFr Bld: 5.7 % — ABNORMAL HIGH (ref <5.7)
Mean Plasma Glucose: 117 mg/dL
eAG (mmol/L): 6.5 mmol/L

## 2024-05-08 LAB — TSH: TSH: 1.21 m[IU]/L

## 2024-05-08 MED ORDER — NYSTATIN 100000 UNIT/GM EX CREA: 1.0000 | TOPICAL_CREAM | Freq: Two times a day (BID) | CUTANEOUS | 3 refills | Status: AC

## 2024-05-08 NOTE — Progress Notes (Signed)
 Provider: Roxan Plough FNP-C   Sheza Strickland, Roxan BROCKS, NP  Patient Care Team: Karima Carrell, Roxan BROCKS, NP as PCP - General (Family Medicine) Ezzard Rolin BIRCH, LCSW as Social Worker (Licensed Clinical Social Worker) Ezzard Rolin BIRCH KEN as [facility] Care Management (Licensed Clinical Social Worker)  Extended Emergency Contact Information Primary Emergency Contact: urquhart,tyiesha Mobile Phone: [phone] Relation: Sister  Code Status:  Full Code  Goals of care: Advanced Directive information    05/08/2024    8:31 AM  Advanced Directives  Does Patient Have a Medical Advance Directive? No  Would patient like information on creating a medical advance directive? No - Patient declined     Chief Complaint  Patient presents with   Transitions Of [facility] follow up.     Discussed the use of AI scribe software for clinical note transcription with the patient, who gave verbal consent to proceed.  History of Present Illness   Gabriela Best is a 43 year old female who presents for a transition of care following hospitalization for abdominal pain and urinary issues.  She was hospitalized on May 04, 2024, for abdominal pain located in the epigastric and lower abdomen. During her hospitalization, she was treated for acid reflux and prescribed Protonix for 14 days, which she has just started. The acid reflux is 'okay' but she still experiences some pain. No blood in stool was noted.  During her hospitalization, a urine specimen showed hematuria, but nitrates and leukocytes were negative. A urine culture was performed and was negative. She was prescribed nitrofurantoin 100 mg, which she has been taking once daily instead of the prescribed twice daily due to a misunderstanding.  She is currently on amlodipine  5 mg daily for hypertension but has not been checking her blood pressure at home. Her most recent blood pressure reading was 138/84 mmHg. She is not taking Zofran , oxycodone , or vitamin  D anymore. She continues to take birth control pills, 10 mg daily, but missed a dose today.  She reports knee pain that began last month when her knee 'gave out' at work. She has seen a specialist who recommended a knee replacement and provided a cortisone shot. She is considering weight loss to improve her condition.  She smokes three to four cigarettes per day.  She reports a skin irritation under her breasts and abdomen, described as itching and irritation, which she attributes to a yeast infection.  No blood in stool. Lower back pain has improved with medication. She cannot tolerate lemon as it causes her throat to swell.  Request Placard forms completed states unable to walk long distance from buildings.     Past Medical History:  Diagnosis Date   Abnormal menstrual cycle    Acute perforated appendicitis 11/14/2020   Herpes simplex type 2 infection    Hypertension    Migraine    Ovarian cyst    Symptomatic cholelithiasis 11/13/2020   Past Surgical History:  Procedure Laterality Date   BREAST BIOPSY Left 01/14/2023   MM LT BREAST BX W LOC DEV 1ST LESION IMAGE BX SPEC STEREO GUIDE 01/14/2023 [facility]   CHOLECYSTECTOMY N/A 11/13/2020   Procedure: LAPAROSCOPIC CHOLECYSTECTOMY WITH INTRAOPERATIVE CHOLANGIOGRAM;  Surgeon: Lyndel Deward PARAS, MD;  Location: [facility];  Service: General;  Laterality: N/A;   KNEE SURGERY     KNEE SURGERY Left    RADIOFREQUENCY ABLATION, LEIOMYOMA, UTERUS, TRANSCERVICAL APPROACH, WITH US  GUIDANCE N/A 01/03/2024   Procedure: RADIOFREQUENCY ABLATION, LEIOMYOMA, UTERUS, TRANSCERVICAL APPROACH, WITH US  GUIDANCE;  Surgeon: Jeralyn Crutch, MD;  Location: [facility] OR;  Service: Gynecology;  Laterality: N/A;    Allergies  Allergen Reactions   Lemon Juice [Lemon Oil] Swelling    Throat closes up   Lactose Intolerance (Gi)    Lime Oil Swelling    Throat closes up    Tilactase Other (See Comments)    Allergies as of 05/08/2024       Reactions    Lemon Juice [lemon Oil] Swelling   Throat closes up   Lactose Intolerance (gi)    Lime Oil Swelling   Throat closes up    Tilactase Other (See Comments)        Medication List        Accurate as of May 08, 2024  9:22 AM. If you have any questions, ask your nurse or doctor.          STOP taking these medications    ondansetron  4 MG disintegrating tablet Commonly known as: ZOFRAN -ODT Stopped by: Angy Swearengin C Ivalee Strauser   oxyCODONE  5 MG immediate release tablet Commonly known as: Oxy IR/ROXICODONE  Stopped by: Paxton Binns C Josep Luviano   oxyCODONE -acetaminophen  5-325 MG tablet Commonly known as: Percocet Stopped by: Alveena Taira C Latayna Ritchie   Vitamin D  (Ergocalciferol ) 1.25 MG (50000 UNIT) Caps capsule Commonly known as: DRISDOL  Stopped by: Avigail Pilling C Electa Sterry       TAKE these medications    acetaminophen  500 MG tablet Commonly known as: TYLENOL  Take 2 tablets (1,000 mg total) by mouth every 6 (six) hours as needed for moderate pain (pain score 4-6) or mild pain (pain score 1-3).   amLODipine  5 MG tablet Commonly known as: NORVASC  Take 1 tablet (5 mg total) by mouth daily.   nitrofurantoin (macrocrystal-monohydrate) 100 MG capsule Commonly known as: MACROBID Take 1 capsule (100 mg total) by mouth 2 (two) times daily for 7 days.   norethindrone  5 MG tablet Commonly known as: AYGESTIN  Take 2 tablets (10 mg total) by mouth daily.   nystatin  cream Commonly known as: MYCOSTATIN  Apply 1 Application topically 2 (two) times daily. Apply to affected areas on the breast and abdominal folds. Started by: Demita Tobia C Netanel Yannuzzi   pantoprazole 40 MG tablet Commonly known as: Protonix Take 1 tablet (40 mg total) by mouth daily for 14 days.        Review of Systems  Constitutional:  Negative for appetite change, chills, fatigue, fever and unexpected weight change.  HENT:  Negative for congestion, dental problem, ear discharge, ear pain, facial swelling, hearing loss, nosebleeds, postnasal drip,  rhinorrhea, sinus pressure, sinus pain, sneezing, sore throat, tinnitus and trouble swallowing.   Eyes:  Negative for pain, discharge, redness, itching and visual disturbance.  Respiratory:  Negative for cough, chest tightness, shortness of breath and wheezing.   Cardiovascular:  Negative for chest pain, palpitations and leg swelling.  Gastrointestinal:  Negative for abdominal distention, abdominal pain, blood in stool, constipation, diarrhea, nausea and vomiting.  Endocrine: Negative for cold intolerance, heat intolerance, polydipsia, polyphagia and polyuria.  Genitourinary:  Negative for difficulty urinating, dysuria, flank pain, frequency and urgency.  Musculoskeletal:  Positive for arthralgias. Negative for back pain, gait problem, joint swelling, myalgias, neck pain and neck stiffness.  Skin:  Negative for color change, pallor, rash and wound.       Itchy abd and breast fold   Neurological:  Negative for dizziness, syncope, speech difficulty, weakness, light-headedness, numbness and headaches.  Hematological:  Does not bruise/bleed easily.  Psychiatric/Behavioral:  Negative for agitation, behavioral problems, confusion, hallucinations, self-injury,  sleep disturbance and suicidal ideas. The patient is not nervous/anxious.     Immunization History  Administered Date(s) Administered   Tdap 04/27/2022   Pertinent  Health Maintenance Due  Topic Date Due   Influenza Vaccine  11/27/2024 (Originally 02/17/2024)   Mammogram  01/05/2026      11/09/2023    9:26 AM 12/05/2023    2:54 PM 12/29/2023   11:22 AM 04/20/2024    5:43 PM 05/08/2024    8:30 AM  Fall Risk  Falls in the past year? 0 0 0  0  Was there an injury with Fall? 0 0 0  0  Fall Risk Category Calculator 0 0 0  0  Patient at Risk for Falls Due to No Fall Risks No Fall Risks No Fall Risks Impaired mobility No Fall Risks  Fall risk Follow up Falls evaluation completed Falls evaluation completed   Falls evaluation completed    Functional Status Survey:    Vitals:   05/08/24 0834  BP: 138/84  Pulse: 72  Resp: 20  Temp: 97.9 F (36.6 C)  SpO2: 98%  Weight: 280 lb 6.4 oz (127.2 kg)  Height: 5' 3 (1.6 m)   Body mass index is 49.67 kg/m. Physical Exam  VITALS: T- 97.9, P- 72, BP- 138/84, SaO2- 98% MEASUREMENTS: Weight- 280.4. GENERAL: Alert, cooperative, well developed, no acute distress HEENT: Normocephalic, normal oropharynx, moist mucous membranes CHEST: Clear to auscultation bilaterally, no wheezes, rhonchi, or crackles CARDIOVASCULAR: Normal heart rate and rhythm, S1 and S2 normal without murmurs ABDOMEN: Soft, non-tender, non-distended, without organomegaly, normal bowel sounds EXTREMITIES: No cyanosis, edema, or leg swelling NEUROLOGICAL: Cranial nerves grossly intact, moves all extremities without gross motor or sensory deficit SKIN: Yeast infection and irritation on skin folds      Labs reviewed: Recent Labs    01/03/24 1100 01/08/24 0023 01/11/24 2027  NA 136 137 140  K 4.2 3.8 4.3  CL 107 108 106  CO2 20* 19* 23  GLUCOSE 104* 127* 115*  BUN 12 16 10   CREATININE 0.84 0.97 0.94  CALCIUM 9.0 9.1 8.7*   Recent Labs    09/04/23 2225 11/28/23 0915 01/08/24 0023 01/11/24 2027  AST 14* 10 12* 12*  ALT 13 10 14 10   ALKPHOS 57  --  51 63  BILITOT 0.4 0.2 0.3 <0.2  PROT 6.2* 6.6 6.6 6.7  ALBUMIN 3.3*  --  3.4* 3.9   Recent Labs    07/26/23 0804 09/04/23 2225 11/28/23 0915 01/08/24 0023 01/11/24 1903  WBC 8.0   < > 9.2 12.9* 10.0  NEUTROABS 4,512  --  5,520  --  5.8  HGB 11.3*   < > 11.7 11.4* 11.5*  HCT 35.6   < > 36.7 34.6* 35.1*  MCV 83.6   < > 85.3 85.4 84.6  PLT 381   < > 433* 432* 459*   < > = values in this interval not displayed.   Lab Results  Component Value Date   TSH 0.84 11/28/2023   Lab Results  Component Value Date   HGBA1C 5.7 (H) 11/28/2023   Lab Results  Component Value Date   CHOL 191 11/28/2023   HDL 49 (L) 11/28/2023   LDLCALC 126 (H)  11/28/2023   TRIG 66 11/28/2023   CHOLHDL 3.9 11/28/2023    Significant Diagnostic Results in last 30 days:  DG Knee Complete 4 Views Left Result Date: 04/09/2024 CLINICAL DATA:  Left knee pain. Knee gave out at work with pain shooting inferiorly.  EXAM: LEFT KNEE - COMPLETE 4+ VIEW COMPARISON:  None Available. FINDINGS: Postoperative changes with plate and screw fixation of the medial tibial metaphysis. Surgical hardware appears intact. Moderate tricompartment degenerative changes with medial greater than lateral compartment narrowing and moderate osteophyte formations. No evidence of acute fracture or dislocation. No focal bone lesion or bone destruction. No significant effusions. IMPRESSION: Postoperative and degenerative changes in the left knee. No acute bony abnormalities. Electronically Signed   By: Elsie Gravely M.D.   On: 04/09/2024 18:39    Assessment/Plan  Transition of care after recent hospitalization Recent hospitalization for MCI with abdominal pain and acid reflux. Currently on Protonix for acid reflux and nitrofurantoin for UTI. - Continue Protonix for 14 days - Complete nitrofurantoin course  Gastroesophageal reflux disease (GERD) GERD with ongoing mild epigastric pain. Currently managed with Protonix and Mylanta. - Continue Protonix for 14 days - Use Mylanta as needed  Urinary tract infection with hematuria UTI with hematuria. Negative urine culture. Currently on nitrofurantoin. - Complete nitrofurantoin course - Increase water intake  Obesity Weight gain since last visit. Discussed dietary modifications and exercise. Consideration of weight loss medications, but insurance coverage is uncertain. - Implement dietary changes: reduce sugary foods, carbohydrates, and high-fat foods - Engage in regular exercise, such as water aerobics or swimming - Consider weight loss medications if insurance allows  Hypertension Well-controlled with amlodipine . Blood pressure  readings are stable. - Continue amlodipine  5 mg daily  Hyperlipidemia Previous high LDL and low HDL. Plan to recheck lipid panel. - Ordered lipid panel  Prediabetes Previous A1c of 5.8. Plan to recheck A1c. - Ordered A1c test  Nicotine dependence, cigarettes Continues to smoke 3-4 cigarettes per day. Discussed smoking cessation to aid in blood pressure control. - Encouraged smoking cessation  Osteoarthritis of knee, status post intra-articular injection, surgical candidate Knee osteoarthritis with previous cortisone injection. Discussed potential for knee replacement surgery. Advised on weight loss and low-impact exercise. - Engage in low-impact exercises such as water aerobics or swimming - Implement dietary changes to aid in weight loss  Intertrigo with secondary candidal infection Intertrigo with secondary candidal infection, likely exacerbated by moisture and friction. Advised on hygiene and topical treatment. - Apply nystatin  cream to affected areas - Wear a bra to reduce skin-to-skin contact - Use cool washcloth to reduce irritation - Ensure areas are dry after showering   Encounter for Forms completion  Unable to walk long distance request Placard for DMV form to be completed  - Placard form completed and signed.Original form given to patient and sent for scanning into chart.   Family/ staff Communication: Reviewed plan of care with patient verbalized understanding   Labs/tests ordered:  - CBC with Differential/Platelet - CMP with eGFR([facility]) - TSH - Hgb A1C - Lipid panel  Next Appointment : Return in about 4 months (around 09/08/2024) for medical mangement of chronic issues.SABRA   Spent 30 minutes of Face to face and non-face to face with patient  >50% time spent counseling; reviewing medical record; tests; labs; documentation and developing future plan of care.   Roxan JAYSON Plough, NP

## 2024-05-17 ENCOUNTER — Ambulatory Visit: Payer: Self-pay | Admitting: Family

## 2024-05-21 ENCOUNTER — Encounter: Payer: Self-pay | Admitting: Radiology

## 2024-05-24 ENCOUNTER — Other Ambulatory Visit: Payer: Self-pay | Admitting: Licensed Clinical Social Worker

## 2024-06-01 NOTE — Patient Instructions (Addendum)
 Visit Information  Ms. Bolduc was given information about Medicaid Managed Care team care coordination services as a part of their [facility] Community Plan Medicaid benefit.   If you would like to schedule transportation through your [facility], please call the following number at least 2 days in advance of your appointment: [phone]   Rides for urgent appointments can also be made after hours by calling Member Services.  Call the Behavioral Health Crisis Line at [phone], at any time, 24 hours a day, 7 days a week. If you are in danger or need immediate medical attention call 911.  Please see education materials related to topics discussed provided by MyChart link.  Care plan and visit instructions communicated with the patient verbally today. Patient agrees to receive a copy in MyChart. Active MyChart status and patient understanding of how to access instructions and care plan via MyChart confirmed with patient.     Licensed Clinical Social Worker will call you on 12/5 at 3:30 PM   Rolin Kerns, LCSW   [facility], [facility] Clinical Social Worker Direct Dial: [phone]  Fax: [phone] Website: [email or web address] 6:31 AM   Following is a copy of your plan of care:  There are no care plans that you recently modified to display for this patient.

## 2024-06-01 NOTE — Patient Outreach (Signed)
 Complex Care Management   Visit Note  05/24/2024  Name:  Gabriela Best MRN: [id number] DOB: [birth year]  Situation: Referral received for Complex Care Management related to Stress I obtained verbal consent from Patient.  Visit completed with Patient  on the phone  Background:   Past Medical History:  Diagnosis Date   Abnormal menstrual cycle    Acute perforated appendicitis 11/14/2020   Herpes simplex type 2 infection    Hypertension    Migraine    Ovarian cyst    Symptomatic cholelithiasis 11/13/2020    Assessment: Patient Reported Symptoms:  Cognitive Cognitive Status: No symptoms reported, Alert and oriented to person, place, and time, Normal speech and language skills Cognitive/Intellectual Conditions Management [RPT]: None reported or documented in medical history or problem list      Neurological Neurological Review of Symptoms: No symptoms reported    HEENT HEENT Symptoms Reported: No symptoms reported      Cardiovascular Cardiovascular Symptoms Reported: No symptoms reported Cardiovascular Management Strategies: Coping strategies, Medication therapy, Routine screening  Respiratory Respiratory Symptoms Reported: No symptoms reported    Endocrine Endocrine Symptoms Reported: No symptoms reported    Gastrointestinal Gastrointestinal Symptoms Reported: Reflux/heartburn Gastrointestinal Comment: Taking tylenol  and protonix for 14 days    Genitourinary Genitourinary Symptoms Reported: Other Other Genitourinary Symptoms: UTI Additional Genitourinary Details: Taking antibiotics for uti Genitourinary Management Strategies: Coping strategies, Medication therapy  Integumentary Integumentary Symptoms Reported: No symptoms reported    Musculoskeletal Musculoskelatal Symptoms Reviewed: Limited mobility, Unsteady gait, Joint pain Musculoskeletal Management Strategies: Coping strategies, Routine screening, Medication therapy Musculoskeletal Comment: Pt reports she can barely  walk She is receiving shots and if it is not effective, will consider knee replacement      Psychosocial Psychosocial Symptoms Reported: Other Other Psychosocial Conditions: Stress Behavioral Management Strategies: Adequate rest, Coping strategies Major Change/Loss/Stressor/Fears (CP): Medical condition, self, Relationship concerns Techniques to Cope with Loss/Stress/Change: Diversional activities      06/01/2024    PHQ2-9 Depression Screening   Little interest or pleasure in doing things    Feeling down, depressed, or hopeless    PHQ-2 - Total Score    Trouble falling or staying asleep, or sleeping too much    Feeling tired or having little energy    Poor appetite or overeating     Feeling bad about yourself - or that you are a failure or have let yourself or your family down    Trouble concentrating on things, such as reading the newspaper or watching television    Moving or speaking so slowly that other people could have noticed.  Or the opposite - being so fidgety or restless that you have been moving around a lot more than usual    Thoughts that you would be better off dead, or hurting yourself in some way    PHQ2-9 Total Score    If you checked off any problems, how difficult have these problems made it for you to do your work, take care of things at home, or get along with other people    Depression Interventions/Treatment      There were no vitals filed for this visit.    Medications Reviewed Today     Reviewed by Ezzard Rolin BIRCH, LCSW (Social Worker) on 06/01/24 at [id number]  Med List Status: <None>   Medication Order Taking? Sig Documenting Provider Last Dose Status Informant  acetaminophen  (TYLENOL ) 500 MG tablet [id number]  Take 2 tablets (1,000 mg total) by mouth every 6 (six) hours as needed  for moderate pain (pain score 4-6) or mild pain (pain score 1-3). Ajewole, Christana, MD  Active   amLODipine  (NORVASC ) 5 MG tablet [id number]  Take 1 tablet (5 mg total) by mouth  daily. Fargo, Amy E, NP  Active   norethindrone  (AYGESTIN ) 5 MG tablet [id number]  Take 2 tablets (10 mg total) by mouth daily. Gil No E, NP  Active   nystatin  cream (MYCOSTATIN ) [id number]  Apply 1 Application topically 2 (two) times daily. Apply to affected areas on the breast and abdominal folds. Ngetich, Dinah C, NP  Active   pantoprazole (PROTONIX) 40 MG tablet [id number]  Take 1 tablet (40 mg total) by mouth daily for 14 days. Rising, Asberry RIGGERS  Expired 05/18/24 2359   Med List Note Efraim Alfreida CROME, CPhT 05/08/23 1551): Patient not taking any medications other than Vitamin D             Recommendation:   Continue Current Plan of Care  Follow Up Plan:   Telephone follow-up in 1 month  Rolin Ezzard HUGHS [facility]  [facility], [facility] Clinical Social Worker Direct Dial: [phone]  Fax: [phone] Website: [email or web address] 6:31 AM

## 2024-06-22 ENCOUNTER — Other Ambulatory Visit: Payer: Self-pay | Admitting: Licensed Clinical Social Worker

## 2024-06-22 NOTE — Patient Instructions (Signed)
 Visit Information  Gabriela Best was given information about Medicaid Managed Care team care coordination services as a part of their [facility] Community Plan Medicaid benefit.   If you would like to schedule transportation through your [facility], please call the following number at least 2 days in advance of your appointment: [phone]   Rides for urgent appointments can also be made after hours by calling Member Services.  Call the Behavioral Health Crisis Line at [phone], at any time, 24 hours a day, 7 days a week. If you are in danger or need immediate medical attention call 911.  Please see education materials related to toics discussed provided by MyChart link.  Care plan and visit instructions communicated with the patient verbally today. Patient agrees to receive a copy in MyChart. Active MyChart status and patient understanding of how to access instructions and care plan via MyChart confirmed with patient.     Licensed Clinical Social Worker will call pt on 07/27/24 at 3 PM  Rolin Kerns, LCSW [place]  [facility], [facility] Clinical Social Worker Direct Dial: [phone]  Fax: [phone] Website: [email or web address] 5:00 PM   Following is a copy of your plan of care:   Goals Addressed             This Visit's Progress    LCSW VBCI Social Work Care Plan   On track    Problems:   Stress Management  CSW Clinical Goal(s):   Over the next 90 days the Patient will attend all scheduled medical appointments as evidenced by patient report and care team review of appointment completion in electronic MEDICAL RECORD NUMBER  demonstrate a reduction in symptoms related to Stress at per pt report .  Interventions:  Mental Health:  Evaluation of current treatment plan related to Stress.  Active listening / Reflection utilized Emotional Support Provided Mindfulness or Relaxation training provided Strategies discussed  to identify and reflect core beliefs that impact current relationships in pt's life. Patient identified various boundaries that she will continue to implement to promote positive mood and decrease in stress  LCSW discussed progress with pt on her setting boundaries with family, friends, partners and following thru with goals.   Patient Goals/Self-Care Activities:  Increase coping skills, healthy habits, and self-management skills  Continue utilizing healthy strategies   Take medications as prescribed  Attend medical appts discussed  Plan:   Telephone follow up appointment with care management team member scheduled for:  2-4 weeks

## 2024-06-22 NOTE — Patient Outreach (Signed)
 Complex Care Management   Visit Note  06/22/2024  Name:  Gabriela Best MRN: [id number] DOB: [birth year]  Situation: Referral received for Complex Care Management related to Stress I obtained verbal consent from Patient.  Visit completed with Patient  on the phone  Background:   Past Medical History:  Diagnosis Date   Abnormal menstrual cycle    Acute perforated appendicitis 11/14/2020   Herpes simplex type 2 infection    Hypertension    Migraine    Ovarian cyst    Symptomatic cholelithiasis 11/13/2020    Assessment: Patient Reported Symptoms:  Cognitive Cognitive Status: No symptoms reported, Alert and oriented to person, place, and time, Normal speech and language skills Cognitive/Intellectual Conditions Management [RPT]: None reported or documented in medical history or problem list   Health Maintenance Behaviors: Annual physical exam  Neurological Neurological Review of Symptoms: No symptoms reported    HEENT HEENT Symptoms Reported: No symptoms reported      Cardiovascular Cardiovascular Symptoms Reported: No symptoms reported    Respiratory Respiratory Symptoms Reported: No symptoms reported    Endocrine Endocrine Symptoms Reported: No symptoms reported    Gastrointestinal Gastrointestinal Symptoms Reported: No symptoms reported      Genitourinary Genitourinary Symptoms Reported: No symptoms reported    Integumentary Integumentary Symptoms Reported: No symptoms reported    Musculoskeletal Musculoskelatal Symptoms Reviewed: No symptoms reported        Psychosocial Psychosocial Symptoms Reported: Other Other Psychosocial Conditions: Stress Behavioral Management Strategies: Adequate rest, Coping strategies, Support system Major Change/Loss/Stressor/Fears (CP): Relationship concerns Techniques to Cope with Loss/Stress/Change: Diversional activities Quality of Family Relationships: involved, supportive    06/22/2024    PHQ2-9 Depression Screening   Little  interest or pleasure in doing things    Feeling down, depressed, or hopeless    PHQ-2 - Total Score    Trouble falling or staying asleep, or sleeping too much    Feeling tired or having little energy    Poor appetite or overeating     Feeling bad about yourself - or that you are a failure or have let yourself or your family down    Trouble concentrating on things, such as reading the newspaper or watching television    Moving or speaking so slowly that other people could have noticed.  Or the opposite - being so fidgety or restless that you have been moving around a lot more than usual    Thoughts that you would be better off dead, or hurting yourself in some way    PHQ2-9 Total Score    If you checked off any problems, how difficult have these problems made it for you to do your work, take care of things at home, or get along with other people    Depression Interventions/Treatment      There were no vitals filed for this visit.    Medications Reviewed Today     Reviewed by Gabriela Rolin BIRCH, LCSW (Social Worker) on 06/22/24 at 1653  Med List Status: <None>   Medication Order Taking? Sig Documenting Provider Last Dose Status Informant  acetaminophen  (TYLENOL ) 500 MG tablet [id number]  Take 2 tablets (1,000 mg total) by mouth every 6 (six) hours as needed for moderate pain (pain score 4-6) or mild pain (pain score 1-3). Best, Christana, MD  Active   amLODipine  (NORVASC ) 5 MG tablet [id number]  Take 1 tablet (5 mg total) by mouth daily. Gabriela No E, NP  Active   norethindrone  (AYGESTIN ) 5 MG tablet [id number]  Take  2 tablets (10 mg total) by mouth daily. Gabriela No E, NP  Active   nystatin  cream (MYCOSTATIN ) [id number]  Apply 1 Application topically 2 (two) times daily. Apply to affected areas on the breast and abdominal folds. Best, Gabriela C, NP  Active   pantoprazole (PROTONIX) 40 MG tablet [id number]  Take 1 tablet (40 mg total) by mouth daily for 14 days. Gabriela Best, Gabriela Best  Expired  05/18/24 2359   Med List Note Gabriela Best, CPhT 05/08/23 1551): Patient not taking any medications other than Vitamin D             Recommendation:   Continue Current Plan of Care  Follow Up Plan:   Telephone follow-up in 1 month  Rolin Gabriela HUGHS [facility]  [facility], [facility] Clinical Social Worker Direct Dial: [phone]  Fax: [phone] Website: [email or web address] 4:59 PM

## 2024-07-10 ENCOUNTER — Emergency Department (HOSPITAL_COMMUNITY)

## 2024-07-10 ENCOUNTER — Emergency Department (HOSPITAL_COMMUNITY): Admission: EM | Admit: 2024-07-10 | Discharge: 2024-07-10 | Disposition: A | Discharge status: Home / Self Care

## 2024-07-10 DIAGNOSIS — J069 Acute upper respiratory infection, unspecified: Secondary | ICD-10-CM

## 2024-07-10 DIAGNOSIS — I10 Essential (primary) hypertension: Secondary | ICD-10-CM | POA: Diagnosis not present

## 2024-07-10 DIAGNOSIS — Z79899 Other long term (current) drug therapy: Secondary | ICD-10-CM | POA: Diagnosis not present

## 2024-07-10 DIAGNOSIS — J101 Influenza due to other identified influenza virus with other respiratory manifestations: Secondary | ICD-10-CM | POA: Insufficient documentation

## 2024-07-10 DIAGNOSIS — R509 Fever, unspecified: Secondary | ICD-10-CM | POA: Diagnosis present

## 2024-07-10 LAB — RESP PANEL BY RT-PCR (RSV, FLU A&B, COVID)  RVPGX2
Influenza A by PCR: POSITIVE — AB
Influenza B by PCR: NEGATIVE
Resp Syncytial Virus by PCR: NEGATIVE
SARS Coronavirus 2 by RT PCR: NEGATIVE

## 2024-07-10 LAB — POC URINE PREG, ED: Preg Test, Ur: NEGATIVE

## 2024-07-10 MED ORDER — IBUPROFEN 800 MG PO TABS
800.0000 mg | ORAL_TABLET | Freq: Once | ORAL | Status: AC
  Administered 2024-07-10: 800 mg via ORAL
  Filled 2024-07-10: qty 1

## 2024-07-10 MED ORDER — ONDANSETRON HCL 4 MG PO TABS: 4.0000 mg | ORAL_TABLET | Freq: Three times a day (TID) | ORAL | 0 refills | Status: AC | PRN

## 2024-07-10 MED ORDER — OSELTAMIVIR PHOSPHATE 75 MG PO CAPS: 75.0000 mg | ORAL_CAPSULE | Freq: Two times a day (BID) | ORAL | 0 refills | Status: AC

## 2024-07-10 NOTE — Discharge Instructions (Signed)
 Thank you for visiting the Emergency Department today. It was a pleasure to be part of your healthcare team.  Your test results were positive for influenza. You have been prescribed Tamiflu  and ondansetron  for your symptoms - you should take your medications as directed. If you have any questions about your medicines, please call your pharmacy or healthcare provider. At home, rest, hydrate, resume normal diet as tolerated, and take Tylenol  and ibuprofen  as needed for fever/pain relief. It is important to watch for warning signs such as worsening pain, fever, trouble breathing, or chest pain. If any of these happen, return to the Emergency Department or call 911. Thank you for trusting us  with your health.

## 2024-07-10 NOTE — ED Triage Notes (Signed)
 Pt c/o h/a, N/V, diarrhea, body aches, cough since this morning.  -exposed to family with the flu

## 2024-07-10 NOTE — ED Provider Notes (Signed)
 " [place] EMERGENCY DEPARTMENT AT [facility] Provider Note   CSN: [id number] Arrival date & time: 07/10/24  9083     Patient presents with: URI   Gabriela Best is a 43 y.o. female with a history of hypertension who presents with 1 day of flulike symptoms including fever, chills, myalgias, headache, nasal congestion, sore throat, and nonproductive cough.  The patient reports associated fatigue and decreased appetite, with intermittent nausea, vomiting, and diarrhea.  Symptoms began gradually and have remained stable despite supportive care at home including antipyretics and over-the-counter cold/flu medication.  The patient denies chest pain, dyspnea, hemoptysis, near syncope/syncope, neck stiffness, or focal neurological deficits. The patient reports sick contacts with similar symptoms as multiple members of her family have the flu.  The patient is in no acute distress.    URI Presenting symptoms: cough        Prior to Admission medications  Medication Sig Start Date End Date Taking? Authorizing Provider  ondansetron  (ZOFRAN ) 4 MG tablet Take 1 tablet (4 mg total) by mouth every 8 (eight) hours as needed for up to 5 days for nausea or vomiting. 07/10/24 07/15/24 Yes Irene Mitcham L, PA  oseltamivir  (TAMIFLU ) 75 MG capsule Take 1 capsule (75 mg total) by mouth 2 (two) times daily for 5 days. 07/10/24 07/15/24 Yes Alvey Brockel L, PA  acetaminophen  (TYLENOL ) 500 MG tablet Take 2 tablets (1,000 mg total) by mouth every 6 (six) hours as needed for moderate pain (pain score 4-6) or mild pain (pain score 1-3). 01/03/24   Ajewole, Christana, MD  amLODipine  (NORVASC ) 5 MG tablet Take 1 tablet (5 mg total) by mouth daily. 01/23/24   Fargo, Amy E, NP  norethindrone  (AYGESTIN ) 5 MG tablet Take 2 tablets (10 mg total) by mouth daily. 01/23/24   Fargo, Amy E, NP  nystatin  cream (MYCOSTATIN ) Apply 1 Application topically 2 (two) times daily. Apply to affected areas on the breast and abdominal  folds. 05/08/24   Ngetich, Dinah C, NP  pantoprazole  (PROTONIX ) 40 MG tablet Take 1 tablet (40 mg total) by mouth daily for 14 days. 05/04/24 05/18/24  Rising, Asberry, PA-C    Allergies: Lemon juice [lemon oil], Lactose intolerance (gi), Lime oil, and Tilactase    Review of Systems  Respiratory:  Positive for cough.     Updated Vital Signs BP 130/73 (BP Location: Left Arm)   Pulse 98   Temp 99 F (37.2 C) (Oral)   Resp 20   SpO2 100%   Physical Exam Vitals and nursing note reviewed.  Constitutional:      General: She is awake. She is not in acute distress.    Appearance: Normal appearance. She is not toxic-appearing.  HENT:     Head: Normocephalic and atraumatic.     Right Ear: Hearing and ear canal normal.     Left Ear: Hearing and ear canal normal.     Ears:     Comments: TM mildly erythematous bilaterally without bulging, effusion, purulence, or loss of landmarks.     Nose: Congestion and rhinorrhea present. Rhinorrhea is clear.     Right Sinus: Maxillary sinus tenderness present.     Left Sinus: Maxillary sinus tenderness present.     Mouth/Throat:     Mouth: Mucous membranes are moist.     Pharynx: Uvula midline. Posterior oropharyngeal erythema present. No pharyngeal swelling, oropharyngeal exudate or uvula swelling.     Tonsils: No tonsillar exudate or tonsillar abscesses.  Eyes:     General:  Lids are normal. Vision grossly intact.     Extraocular Movements: Extraocular movements intact.     Conjunctiva/sclera: Conjunctivae normal.     Pupils: Pupils are equal, round, and reactive to light.  Cardiovascular:     Rate and Rhythm: Normal rate and regular rhythm.     Pulses: Normal pulses.          Radial pulses are 2+ on the right side.  Pulmonary:     Effort: Pulmonary effort is normal. No respiratory distress.     Breath sounds: Normal breath sounds. No wheezing.     Comments: Patient has no difficulty speaking in complete sentences.  Abdominal:     General:  Abdomen is flat.     Palpations: Abdomen is soft.     Tenderness: There is no abdominal tenderness.  Musculoskeletal:        General: Normal range of motion.     Cervical back: Full passive range of motion without pain, normal range of motion and neck supple. No rigidity. No spinous process tenderness.  Lymphadenopathy:     Cervical: Cervical adenopathy present.  Skin:    General: Skin is warm and dry.     Capillary Refill: Capillary refill takes less than 2 seconds.     Findings: No rash.  Neurological:     General: No focal deficit present.     Mental Status: She is alert. Mental status is at baseline.  Psychiatric:        Attention and Perception: Attention normal.        Mood and Affect: Mood normal.        Speech: Speech normal.     (all labs ordered are listed, but only abnormal results are displayed) Labs Reviewed  RESP PANEL BY RT-PCR (RSV, FLU A&B, COVID)  [id number] - Abnormal; Notable for the following components:      Result Value   Influenza A by PCR POSITIVE (*)    All other components within normal limits  POC URINE PREG, ED    EKG: None  Radiology: DG Chest 2 View Result Date: 07/10/2024 CLINICAL DATA:  Cough for 1 day.  Nausea and vomiting. EXAM: CHEST - 2 VIEW COMPARISON:  11/07/2023 FINDINGS: The heart size and mediastinal contours are within normal limits. Low lung volumes noted. Both lungs are clear. The visualized skeletal structures are unremarkable. IMPRESSION: Low lung volumes. No active disease. Electronically Signed   By: Norleen DELENA Kil M.D.   On: 07/10/2024 11:26     Procedures   Medications Ordered in the ED  ibuprofen  (ADVIL ) tablet 800 mg (800 mg Oral Given 07/10/24 1120)                                  Medical Decision Making Amount and/or Complexity of Data Reviewed Labs:  Decision-making details documented in ED Course. Radiology: ordered.   Patient presents to the ED for: Flulike symptoms This involves an extensive number of  treatment options Differential diagnosis includes:  Infectious etiology Co-morbid conditions: Hypertension  Clinical Course as of 07/10/24 2115  Tue Jul 10, 2024  1010 Temp: 99 F (37.2 C) Afebrile, vital stable, patient in no acute distress. [ML]  1030 POC Urine Pregnancy, ED (not at [facility] or [facility]) Negative [ML]  1115 Resp panel by RT-PCR (RSV, Flu A&B, Covid) Anterior Nasal Swab(!) Flu A + [ML]  1129 DG Chest 2 View No acute findings [ML]  1129 Ibuprofen   800 mg for symptomatic relief [ML]    Clinical Course User Index [ML] Willma Duwaine CROME, PA    Data Reviewed / Actions Taken: Labs ordered/reviewed with my independent interpretation in ED course above. Imaging ordered/reviewed with my independent interpretation in ED course above. I agree with the radiologists interpretation.   Management / Treatments: See ED course above for medications, treatments administered, and clinical rationale.   Reevaluation of the patient after these medicines showed that the patient improved. I have reviewed the patients home medicines and have made adjustments as needed  Test Considered/Diagnostic tools:  Additional diagnostic testing was not considered based on the patients presenting symptoms, risk factors, and initial clinical assessment.  ED Course / Reassessments: Problem List: Influenza A 43 year old female presented for flulike symptoms. Initial assessment included history, physical exam, and review of prior medical records. Laboratory testing was obtained given clinical presentation and viral PCR testing returned positive for influenza A.  There is low clinical suspicion for bacterial etiology requiring antibiotics at this time based on PCR results, reassuring vital signs, and overall clinical appearance.  The patient was treated symptomatically with antipyretics and supportive care.  Antiviral therapy was discussed, including risks and benefit and was initiated based on symptom duration, risk  factors, and shared decision making. The patient was also prescribed a short course of ondansetron  for nausea. The patient remained stable during the ED course and was deemed appropriate for outpatient management.  Discharge planning include strict return precautions for worsening respiratory symptoms, persistent high fevers, chest pain, inability to tolerate oral intake, or new neurological symptoms.  The patient was advised on continued supportive care at home, including hydration, rest, and antipyretic use, as well as isolation precautions to limit transmission.   Disposition: Disposition: Discharge with close follow-up with PCP for further evaluation and care. Rationale for disposition: Stable for discharge. The disposition plan and rationale were discussed with the patient at the bedside, all questions were addressed, and the patient demonstrated understanding.  This note was produced using Electronics Engineer. While I have reviewed and verified all clinical information, transcription errors may remain.      Final diagnoses:  Viral upper respiratory illness    ED Discharge Orders          Ordered    oseltamivir  (TAMIFLU ) 75 MG capsule  2 times daily        07/10/24 1136    ondansetron  (ZOFRAN ) 4 MG tablet  Every 8 hours PRN        07/10/24 1136               Xeng Kucher L, [place] 07/10/24 2120    Neysa Caron PARAS, DO 07/11/24 9264  "

## 2024-07-27 ENCOUNTER — Telehealth: Payer: Self-pay | Admitting: Licensed Clinical Social Worker

## 2024-07-27 ENCOUNTER — Encounter: Payer: Self-pay | Admitting: Licensed Clinical Social Worker

## 2024-07-30 ENCOUNTER — Ambulatory Visit: Admitting: Obstetrics and Gynecology

## 2024-07-30 ENCOUNTER — Encounter: Payer: Self-pay | Admitting: Obstetrics and Gynecology

## 2024-07-30 ENCOUNTER — Other Ambulatory Visit: Payer: Self-pay

## 2024-07-30 VITALS — BP 122/84 | HR 81 | Wt 290.1 lb

## 2024-07-30 DIAGNOSIS — D219 Benign neoplasm of connective and other soft tissue, unspecified: Secondary | ICD-10-CM

## 2024-07-30 DIAGNOSIS — N946 Dysmenorrhea, unspecified: Secondary | ICD-10-CM | POA: Diagnosis not present

## 2024-07-30 DIAGNOSIS — N939 Abnormal uterine and vaginal bleeding, unspecified: Secondary | ICD-10-CM | POA: Diagnosis not present

## 2024-07-30 NOTE — Progress Notes (Signed)
 "   GYNECOLOGY VISIT  Patient name: Gabriela Best MRN [id number]  Date of birth: August 19, 1980 Chief Complaint:   Vaginal Discharge   History:  Gabriela Best here for fibroid/AUB follow up. Follow up from prior procedure. Havin gclear discharge with odor. Period intially stopped, then had a light period last month, more than spotting. Still taking norethindrone  though recommended to stop; was in a habit of taking it daily but will stop when this current prescription runs out. No bleeding for about 1-2 months following procedure then it resumed, more than spotting but light, lasting for about 3-4 days, which occurred in October and December. Will have cramping like a period starting but no subsequent bleeding currently. Will have itching of inner thigh with wetness but none inside the vagina. No longer having pelvic pain. Not currently sexually active with female partner.   The following portions of the patient's history were reviewed and updated as appropriate: allergies, current medications, past family history, past medical history, past social history, past surgical history and problem list.   Health Maintenance:   Last pap     Component Value Date/Time   DIAGPAP  03/25/2023 0931    - Negative for intraepithelial lesion or malignancy (NILM)   HPVHIGH Negative 03/25/2023 0931   ADEQPAP  03/25/2023 0931    Satisfactory for evaluation; transformation zone component PRESENT.    Health Maintenance  Topic Date Due   Pneumococcal Vaccine (1 of 2 - PCV) 08/22/2024*   Flu Shot  11/27/2024*   Hepatitis B Vaccine (1 of 3 - 19+ 3-dose series) 05/08/2025*   HPV Vaccine (1 - 3-dose SCDM series) 05/08/2025*   COVID-19 Vaccine (1 - 2025-26 season) 05/24/2029*   Breast Cancer Screening  01/05/2026   Pap with HPV screening  03/24/2028   DTaP/Tdap/Td vaccine (2 - Td or Tdap) 04/27/2032   Hepatitis C Screening  Completed   HIV Screening  Completed   Meningitis B Vaccine  Aged Out  *Topic was postponed.  The date shown is not the original due date.      Review of Systems:  Pertinent items are noted in HPI. Comprehensive review of systems was otherwise negative.   Objective:  Physical Exam BP 122/84   Pulse 81   Wt 290 lb 1.6 oz (131.6 kg)   LMP 07/02/2024 (Approximate)   BMI 51.39 kg/m    Physical Exam Vitals and nursing note reviewed.  Constitutional:      Appearance: Normal appearance.  HENT:     Head: Normocephalic and atraumatic.  Pulmonary:     Effort: Pulmonary effort is normal.  Skin:    General: Skin is warm and dry.  Neurological:     General: No focal deficit present.     Mental Status: She is alert.  Psychiatric:        Mood and Affect: Mood normal.        Behavior: Behavior normal.        Thought Content: Thought content normal.        Judgment: Judgment normal.        Assessment & Plan:  1. Abnormal uterine bleeding (AUB) (Primary) 2. Dysmenorrhea 3. Fibroid Approximately 7 months postop from sonata procedure and still using aygestin  with nearly complete amenorrhea. Will complete current prescription and observe menses without it. Follow up in 3-4 months to assess menstrual cycle at that point. Noted vaginal discharge likely benign consequence of procedure and can be monitored if without any additional symptoms. Can reassess fibroid with ultrasound when  1 year out from procedure.    Carter Quarry, MD [facility], [facility] "

## 2024-07-31 NOTE — Patient Instructions (Signed)
 Gabriela Best - I am sorry I was unable to reach you today for our scheduled appointment. I work with Ngetich, Roxan BROCKS, NP and am calling to support your healthcare needs. Please contact me at [phone] at your earliest convenience. I look forward to speaking with you soon.   Thank you,  Rolin Kerns, LCSW [place]  [facility], [facility] Clinical Social Worker Direct Dial: [phone]  Fax: [phone] Website: [email or web address] 9:38 AM

## 2024-08-31 ENCOUNTER — Telehealth: Admitting: Licensed Clinical Social Worker

## 2024-09-13 ENCOUNTER — Ambulatory Visit: Payer: Self-pay | Admitting: Family
